# Patient Record
Sex: Female | Born: 1955 | Race: White | Hispanic: No | Smoking: Current some day smoker
Health system: Southern US, Community
[De-identification: ages and names within clinical notes are randomized; demographics above are authoritative.]

## PROBLEM LIST (undated history)

## (undated) DIAGNOSIS — I700 Atherosclerosis of aorta: Secondary | ICD-10-CM

## (undated) DIAGNOSIS — T4145XA Adverse effect of unspecified anesthetic, initial encounter: Secondary | ICD-10-CM

## (undated) DIAGNOSIS — N200 Calculus of kidney: Secondary | ICD-10-CM

## (undated) DIAGNOSIS — M47816 Spondylosis without myelopathy or radiculopathy, lumbar region: Secondary | ICD-10-CM

## (undated) DIAGNOSIS — C801 Malignant (primary) neoplasm, unspecified: Secondary | ICD-10-CM

## (undated) DIAGNOSIS — Z8709 Personal history of other diseases of the respiratory system: Secondary | ICD-10-CM

## (undated) DIAGNOSIS — F40240 Claustrophobia: Secondary | ICD-10-CM

## (undated) DIAGNOSIS — Z789 Other specified health status: Secondary | ICD-10-CM

## (undated) DIAGNOSIS — K219 Gastro-esophageal reflux disease without esophagitis: Secondary | ICD-10-CM

## (undated) DIAGNOSIS — N289 Disorder of kidney and ureter, unspecified: Secondary | ICD-10-CM

## (undated) DIAGNOSIS — M51369 Other intervertebral disc degeneration, lumbar region without mention of lumbar back pain or lower extremity pain: Secondary | ICD-10-CM

## (undated) DIAGNOSIS — Z9289 Personal history of other medical treatment: Secondary | ICD-10-CM

## (undated) DIAGNOSIS — Z973 Presence of spectacles and contact lenses: Secondary | ICD-10-CM

## (undated) DIAGNOSIS — F419 Anxiety disorder, unspecified: Secondary | ICD-10-CM

## (undated) DIAGNOSIS — R519 Headache, unspecified: Secondary | ICD-10-CM

## (undated) DIAGNOSIS — Z862 Personal history of diseases of the blood and blood-forming organs and certain disorders involving the immune mechanism: Secondary | ICD-10-CM

## (undated) DIAGNOSIS — Z87442 Personal history of urinary calculi: Secondary | ICD-10-CM

## (undated) DIAGNOSIS — M5136 Other intervertebral disc degeneration, lumbar region: Secondary | ICD-10-CM

## (undated) DIAGNOSIS — J449 Chronic obstructive pulmonary disease, unspecified: Secondary | ICD-10-CM

## (undated) DIAGNOSIS — Z972 Presence of dental prosthetic device (complete) (partial): Secondary | ICD-10-CM

## (undated) DIAGNOSIS — M1990 Unspecified osteoarthritis, unspecified site: Secondary | ICD-10-CM

## (undated) DIAGNOSIS — T8859XA Other complications of anesthesia, initial encounter: Secondary | ICD-10-CM

## (undated) DIAGNOSIS — R51 Headache: Secondary | ICD-10-CM

## (undated) HISTORY — DX: Anxiety disorder, unspecified: F41.9

## (undated) HISTORY — DX: Unspecified osteoarthritis, unspecified site: M19.90

## (undated) HISTORY — PX: ESOPHAGOGASTRODUODENOSCOPY: SHX1529

## (undated) HISTORY — PX: BACK SURGERY: SHX140

## (undated) HISTORY — PX: CERVICAL FUSION: SHX112

## (undated) HISTORY — PX: APPENDECTOMY: SHX54

## (undated) HISTORY — PX: COLONOSCOPY: SHX174

## (undated) HISTORY — PX: TOTAL HIP ARTHROPLASTY: SHX124

---

## 2017-01-30 ENCOUNTER — Emergency Department (HOSPITAL_COMMUNITY)
Admission: EM | Admit: 2017-01-30 | Discharge: 2017-01-30 | Disposition: A | Discharge status: Home / Self Care | Payer: Medicare Other | Attending: Emergency Medicine | Admitting: Emergency Medicine

## 2017-01-30 ENCOUNTER — Emergency Department (HOSPITAL_COMMUNITY): Payer: Medicare Other

## 2017-01-30 ENCOUNTER — Encounter (HOSPITAL_COMMUNITY): Payer: Self-pay | Admitting: Emergency Medicine

## 2017-01-30 DIAGNOSIS — W182XXA Fall in (into) shower or empty bathtub, initial encounter: Secondary | ICD-10-CM | POA: Diagnosis not present

## 2017-01-30 DIAGNOSIS — Z760 Encounter for issue of repeat prescription: Secondary | ICD-10-CM | POA: Insufficient documentation

## 2017-01-30 DIAGNOSIS — Y939 Activity, unspecified: Secondary | ICD-10-CM | POA: Insufficient documentation

## 2017-01-30 DIAGNOSIS — Y999 Unspecified external cause status: Secondary | ICD-10-CM | POA: Diagnosis not present

## 2017-01-30 DIAGNOSIS — W19XXXA Unspecified fall, initial encounter: Secondary | ICD-10-CM

## 2017-01-30 DIAGNOSIS — G8929 Other chronic pain: Secondary | ICD-10-CM | POA: Diagnosis not present

## 2017-01-30 DIAGNOSIS — S79912A Unspecified injury of left hip, initial encounter: Secondary | ICD-10-CM | POA: Diagnosis present

## 2017-01-30 DIAGNOSIS — Z96642 Presence of left artificial hip joint: Secondary | ICD-10-CM | POA: Insufficient documentation

## 2017-01-30 DIAGNOSIS — S7002XA Contusion of left hip, initial encounter: Secondary | ICD-10-CM | POA: Insufficient documentation

## 2017-01-30 DIAGNOSIS — F17200 Nicotine dependence, unspecified, uncomplicated: Secondary | ICD-10-CM | POA: Diagnosis not present

## 2017-01-30 DIAGNOSIS — Y929 Unspecified place or not applicable: Secondary | ICD-10-CM | POA: Insufficient documentation

## 2017-01-30 MED ORDER — DIAZEPAM 5 MG PO TABS: 5.0000 mg | ORAL_TABLET | Freq: Every day | ORAL | 0 refills | Status: DC

## 2017-01-30 MED ORDER — OXYCODONE-ACETAMINOPHEN 5-325 MG PO TABS
2.0000 | ORAL_TABLET | Freq: Once | ORAL | Status: AC
  Administered 2017-01-30: 2 via ORAL
  Filled 2017-01-30: qty 2

## 2017-01-30 MED ORDER — OXYCODONE-ACETAMINOPHEN 10-325 MG PO TABS: 1.0000 | ORAL_TABLET | Freq: Four times a day (QID) | ORAL | 0 refills | Status: DC | PRN

## 2017-01-30 NOTE — ED Notes (Signed)
Patient transported to X-ray 
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
## 2017-01-30 NOTE — Discharge Instructions (Signed)
Keep your scheduled appointment with Dr. Marlou Sa on 02/03/2017

## 2017-01-30 NOTE — ED Triage Notes (Signed)
Pt c/o pain in left side of body after having several falls.  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

## 2017-05-16 ENCOUNTER — Encounter (HOSPITAL_COMMUNITY): Payer: Self-pay | Admitting: Emergency Medicine

## 2017-05-16 ENCOUNTER — Emergency Department (HOSPITAL_COMMUNITY): Payer: Medicare Other

## 2017-05-16 ENCOUNTER — Emergency Department (HOSPITAL_COMMUNITY)
Admission: EM | Admit: 2017-05-16 | Discharge: 2017-05-17 | Disposition: A | Discharge status: Home / Self Care | Payer: Medicare Other | Attending: Emergency Medicine | Admitting: Emergency Medicine

## 2017-05-16 DIAGNOSIS — R1032 Left lower quadrant pain: Secondary | ICD-10-CM | POA: Diagnosis present

## 2017-05-16 DIAGNOSIS — N200 Calculus of kidney: Secondary | ICD-10-CM

## 2017-05-16 DIAGNOSIS — F17200 Nicotine dependence, unspecified, uncomplicated: Secondary | ICD-10-CM | POA: Diagnosis not present

## 2017-05-16 DIAGNOSIS — Z79899 Other long term (current) drug therapy: Secondary | ICD-10-CM | POA: Diagnosis not present

## 2017-05-16 DIAGNOSIS — Z96649 Presence of unspecified artificial hip joint: Secondary | ICD-10-CM | POA: Insufficient documentation

## 2017-05-16 HISTORY — DX: Disorder of kidney and ureter, unspecified: N28.9

## 2017-05-16 LAB — CBC WITH DIFFERENTIAL/PLATELET
BASOS ABS: 0 10*3/uL (ref 0.0–0.1)
BASOS PCT: 0 %
EOS PCT: 2 %
Eosinophils Absolute: 0.1 10*3/uL (ref 0.0–0.7)
HCT: 45.9 % (ref 36.0–46.0)
Hemoglobin: 15.3 g/dL — ABNORMAL HIGH (ref 12.0–15.0)
LYMPHS PCT: 33 %
Lymphs Abs: 2.7 10*3/uL (ref 0.7–4.0)
MCH: 30.7 pg (ref 26.0–34.0)
MCHC: 33.3 g/dL (ref 30.0–36.0)
MCV: 92 fL (ref 78.0–100.0)
MONO ABS: 0.5 10*3/uL (ref 0.1–1.0)
Monocytes Relative: 6 %
Neutro Abs: 4.9 10*3/uL (ref 1.7–7.7)
Neutrophils Relative %: 59 %
PLATELETS: 212 10*3/uL (ref 150–400)
RBC: 4.99 MIL/uL (ref 3.87–5.11)
RDW: 12.9 % (ref 11.5–15.5)
WBC: 8.2 10*3/uL (ref 4.0–10.5)

## 2017-05-16 LAB — COMPREHENSIVE METABOLIC PANEL
ALBUMIN: 4 g/dL (ref 3.5–5.0)
ALT: 14 U/L (ref 14–54)
AST: 18 U/L (ref 15–41)
Alkaline Phosphatase: 85 U/L (ref 38–126)
Anion gap: 7 (ref 5–15)
BUN: 7 mg/dL (ref 6–20)
CHLORIDE: 102 mmol/L (ref 101–111)
CO2: 28 mmol/L (ref 22–32)
CREATININE: 0.66 mg/dL (ref 0.44–1.00)
Calcium: 9.4 mg/dL (ref 8.9–10.3)
GFR calc Af Amer: >60 mL/min (ref >60)
GFR calc non Af Amer: >60 mL/min (ref >60)
GLUCOSE: 125 mg/dL — AB (ref 65–99)
POTASSIUM: 3.7 mmol/L (ref 3.5–5.1)
SODIUM: 137 mmol/L (ref 135–145)
Total Bilirubin: 0.8 mg/dL (ref 0.3–1.2)
Total Protein: 6.7 g/dL (ref 6.5–8.1)

## 2017-05-16 LAB — URINALYSIS, ROUTINE W REFLEX MICROSCOPIC
Bilirubin Urine: NEGATIVE
GLUCOSE, UA: NEGATIVE mg/dL
KETONES UR: NEGATIVE mg/dL
NITRITE: NEGATIVE
PROTEIN: NEGATIVE mg/dL
Specific Gravity, Urine: 1.013 (ref 1.005–1.030)
pH: 7 (ref 5.0–8.0)

## 2017-05-16 MED ORDER — FENTANYL CITRATE (PF) 100 MCG/2ML IJ SOLN
100.0000 ug | Freq: Once | INTRAMUSCULAR | Status: AC
Start: 2017-05-16 — End: 2017-05-16
  Administered 2017-05-16: 100 ug via INTRAVENOUS
  Filled 2017-05-16: qty 2

## 2017-05-16 MED ORDER — SODIUM CHLORIDE 0.9 % IV BOLUS (SEPSIS)
1000.0000 mL | Freq: Once | INTRAVENOUS | Status: AC
  Administered 2017-05-16: 1000 mL via INTRAVENOUS

## 2017-05-16 MED ORDER — OXYCODONE-ACETAMINOPHEN 5-325 MG PO TABS
ORAL_TABLET | ORAL | Status: AC
  Filled 2017-05-16: qty 1

## 2017-05-16 MED ORDER — MORPHINE SULFATE (PF) 4 MG/ML IV SOLN
4.0000 mg | Freq: Once | INTRAVENOUS | Status: DC
  Filled 2017-05-16: qty 1

## 2017-05-16 MED ORDER — ONDANSETRON 4 MG PO TBDP
8.0000 mg | ORAL_TABLET | Freq: Once | ORAL | Status: AC
  Administered 2017-05-16: 8 mg via ORAL

## 2017-05-16 MED ORDER — DEXTROSE 5 % IV SOLN
1.0000 g | Freq: Once | INTRAVENOUS | Status: DC
  Filled 2017-05-16: qty 10

## 2017-05-16 MED ORDER — OXYCODONE-ACETAMINOPHEN 5-325 MG PO TABS
1.0000 | ORAL_TABLET | Freq: Once | ORAL | Status: AC
  Administered 2017-05-16: 1 via ORAL

## 2017-05-16 MED ORDER — ONDANSETRON 4 MG PO TBDP
ORAL_TABLET | ORAL | Status: AC
  Filled 2017-05-16: qty 2

## 2017-05-16 MED ORDER — KETOROLAC TROMETHAMINE 30 MG/ML IJ SOLN
30.0000 mg | Freq: Once | INTRAMUSCULAR | Status: AC
  Administered 2017-05-16: 30 mg via INTRAVENOUS
  Filled 2017-05-16: qty 1

## 2017-05-16 NOTE — ED Provider Notes (Signed)
[facility] DEPT Provider Note   CSN: [id number] Arrival date & time: 05/16/17  1958     History   Chief Complaint Chief Complaint  Patient presents with  . Flank Pain  . Hematuria    HPI Iverna Hammac is a 61 y.o. female.  Hayle Parisi is a 61 y.o. Female who presents to the emergency department complaining of urinary symptoms and flank pain ongoing for 2-3 days. Patient reports her symptoms began with some urinary pressure, increased frequency and urgency to urinate about 2-3 days ago. She tells me she's also been having some left flank pain that can fluctuate in intensity without warning. She reports she has a history of kidney stones and reports this feels similar. She also believes she may be developing a urinary tract infection. She reports some nausea but no vomiting. She took a Percocet earlier today with little relief of her symptoms. She denies fevers, difficulty urinating, abdominal pain, vomiting, diarrhea, chest pain, shortness of breath or rashes.   The history is provided by the patient, medical records and a relative. No language interpreter was used.  Flank Pain  Pertinent negatives include no chest pain, no abdominal pain, no headaches and no shortness of breath.  Hematuria  Pertinent negatives include no chest pain, no abdominal pain, no headaches and no shortness of breath.    Past Medical History:  Diagnosis Date  . Renal disorder     There are no active problems to display for this patient.   Past Surgical History:  Procedure Laterality Date  . APPENDECTOMY    . BACK SURGERY    . CERVICAL FUSION    . TOTAL HIP ARTHROPLASTY      OB History    No data available       Home Medications    Prior to Admission medications   Medication Sig Start Date End Date Taking? Authorizing Provider  diazepam (VALIUM) 5 MG tablet Take 1 tablet (5 mg total) by mouth daily. 01/30/17   Orlie Dakin, MD  naproxen (NAPROSYN) 250 MG tablet Take 1 tablet  (250 mg total) by mouth 2 (two) times daily with a meal. 05/17/17   Waynetta Pean, PA-C  oxyCODONE-acetaminophen (PERCOCET) 10-325 MG tablet Take 1 tablet by mouth every 6 (six) hours as needed for pain. 01/30/17   Orlie Dakin, MD  tamsulosin (FLOMAX) 0.4 MG CAPS capsule Take 1 capsule (0.4 mg total) by mouth 2 (two) times daily. 05/17/17   Waynetta Pean, PA-C    Family History No family history on file.  Social History Social History  Substance Use Topics  . Smoking status: Current Every Day Smoker  . Smokeless tobacco: Never Used  . Alcohol use No     Allergies   Morphine and related; Aspirin; Benadryl [diphenhydramine]; Ciprocin-fluocin-procin [fluocinolone]; Penicillins; and Sulfa antibiotics   Review of Systems Review of Systems  Constitutional: Negative for chills and fever.  HENT: Negative for congestion and sore throat.   Eyes: Negative for visual disturbance.  Respiratory: Negative for cough, shortness of breath and wheezing.   Cardiovascular: Negative for chest pain.  Gastrointestinal: Positive for nausea. Negative for abdominal pain, diarrhea and vomiting.  Genitourinary: Positive for dysuria, flank pain, frequency, hematuria and urgency. Negative for decreased urine volume, difficulty urinating, pelvic pain and vaginal pain.  Musculoskeletal: Negative for back pain and neck pain.  Skin: Negative for rash.  Neurological: Negative for weakness, light-headedness and headaches.     Physical Exam Updated Vital Signs BP 121/66 (BP Location: Right Arm)  Pulse 70   Temp 98.3 F (36.8 C) (Oral)   Resp 18   SpO2 96%   Physical Exam  Constitutional: She appears well-developed and well-nourished. No distress.  Nontoxic appearing.  HENT:  Head: Normocephalic and atraumatic.  Mouth/Throat: Oropharynx is clear and moist.  Eyes: Pupils are equal, round, and reactive to light. Conjunctivae are normal. Right eye exhibits no discharge. Left eye exhibits no discharge.    Neck: Neck supple.  Cardiovascular: Normal rate, regular rhythm, normal heart sounds and intact distal pulses.  Exam reveals no gallop and no friction rub.   No murmur heard. Pulmonary/Chest: Effort normal and breath sounds normal. No respiratory distress. She has no wheezes. She has no rales.  Abdominal: Soft. Bowel sounds are normal. She exhibits no distension and no mass. There is tenderness. There is no rebound and no guarding.  Abdomen is soft and nontender to palpation. Bowel sounds are present. Patient has left flank tenderness to palpation. No peritoneal signs.  Musculoskeletal: She exhibits no edema.  Lymphadenopathy:    She has no cervical adenopathy.  Neurological: She is alert. Coordination normal.  Skin: Skin is warm and dry. No rash noted. She is not diaphoretic. No erythema. No pallor.  Psychiatric: She has a normal mood and affect. Her behavior is normal.  Nursing note and vitals reviewed.    ED Treatments / Results  Labs (all labs ordered are listed, but only abnormal results are displayed) Labs Reviewed  URINALYSIS, ROUTINE W REFLEX MICROSCOPIC - Abnormal; Notable for the following:       Result Value   APPearance HAZY (*)    Hgb urine dipstick SMALL (*)    Leukocytes, UA LARGE (*)    Bacteria, UA FEW (*)    Squamous Epithelial / LPF 6-30 (*)    All other components within normal limits  CBC WITH DIFFERENTIAL/PLATELET - Abnormal; Notable for the following:    Hemoglobin 15.3 (*)    All other components within normal limits  COMPREHENSIVE METABOLIC PANEL - Abnormal; Notable for the following:    Glucose, Bld 125 (*)    All other components within normal limits  URINE CULTURE    EKG  EKG Interpretation None       Radiology Ct Renal Stone Study  Result Date: 05/16/2017 CLINICAL DATA:  Initial evaluation for acute left flank pain. EXAM: CT ABDOMEN AND PELVIS WITHOUT CONTRAST TECHNIQUE: Multidetector CT imaging of the abdomen and pelvis was performed  following the standard protocol without IV contrast. COMPARISON:  None. FINDINGS: Lower chest: Visualized lung bases are clear. Hepatobiliary: Subcentimeter calcification noted within liver. Liver otherwise unremarkable. Hypointense focus within the gallbladder lumen likely related to stones. Gallbladder otherwise unremarkable without acute inflammatory changes. No biliary dilatation. Pancreas: Pancreas within normal limits. Spleen: Spleen within normal limits. Adrenals/Urinary Tract: Adrenal glands are normal. On the left, there are multiple obstructive stones stacked on top of each other within the proximal left ureter with secondary moderate left hydroureteronephrosis. There are 3 stones total all which measuring approximately 8-9 mm. Left ureter is decompressed distally with no other definite calculi identified, although the distal ureter somewhat limited by streak artifact from left hip arthroplasty. Multiple additional nonobstructive calculi present within left kidney, largest of which position within the lower pole and measures 12 mm. On the right, multiple nonobstructive calculi present, largest of which position within the upper pole and measures 7 mm. No other radiopaque calculi seen along the course of the right renal collecting system. No right-sided hydronephrosis or  hydroureter. Bladder partially distended without acute abnormality. No layering stones within the bladder lumen. Stomach/Bowel: Small hiatal hernia noted. Stomach otherwise unremarkable. No evidence for bowel obstruction. No acute inflammatory changes seen about the bowels. Vascular/Lymphatic: Mild to moderate aortic atherosclerosis. No aneurysm. No adenopathy. Reproductive: Uterus and ovaries within normal limits. Other: No free air or fluid. Musculoskeletal: Asymmetric atrophy of the left psoas muscle noted. Left hip arthroplasty. No acute osseous abnormality. No worrisome lytic or blastic osseous lesions. IMPRESSION: 1. Three total  obstructive stones measuring 8-9 mm stacked within the proximal left ureter with secondary moderate left hydroureteronephrosis. 2. Additional bilateral nonobstructive nephrolithiasis as above. 3. Cholelithiasis. 4. Aortic atherosclerosis. Electronically Signed   By: Jeannine Boga M.D.   On: 05/16/2017 22:37    Procedures Procedures (including critical care time)  Medications Ordered in ED Medications  ondansetron (ZOFRAN-ODT) 4 MG disintegrating tablet (not administered)  oxyCODONE-acetaminophen (PERCOCET/ROXICET) 5-325 MG per tablet (not administered)  oxyCODONE-acetaminophen (PERCOCET/ROXICET) 5-325 MG per tablet 1 tablet (1 tablet Oral Given 05/16/17 2013)  ondansetron (ZOFRAN-ODT) disintegrating tablet 8 mg (8 mg Oral Given 05/16/17 2013)  sodium chloride 0.9 % bolus 1,000 mL (1,000 mLs Intravenous New Bag/Given 05/16/17 2319)  ketorolac (TORADOL) 30 MG/ML injection 30 mg (30 mg Intravenous Given 05/16/17 2326)  fentaNYL (SUBLIMAZE) injection 100 mcg (100 mcg Intravenous Given 05/16/17 2336)     Initial Impression / Assessment and Plan / ED Course  I have reviewed the triage vital signs and the nursing notes.  Pertinent labs & imaging results that were available during my care of the patient were reviewed by me and considered in my medical decision making (see chart for details).    This is a 61 y.o. Female who presents to the emergency department complaining of urinary symptoms and flank pain ongoing for 2-3 days. Patient reports her symptoms began with some urinary pressure, increased frequency and urgency to urinate about 2-3 days ago. She tells me she's also been having some left flank pain that can fluctuate in intensity without warning. She reports she has a history of kidney stones and reports this feels similar. She also believes she may be developing a urinary tract infection. She reports some nausea but no vomiting. On exam the patient is afebrile and nontoxic appearing. She  has a left flank tenderness to palpation. Abdomen is soft and nontender to palpation. CMP shows preserved kidney function. Urinalysis is nitrite negative with large leukocytes, 6-30 white blood cells, few bacteria and 6-30 squamous epithelial cells. Suspect this is contaminated. Urine sent for culture. Small hemoglobin also noted. CT renal stone study shows 3 total obstructive stones measuring 8-9 mm stacked within the proximal left ureter with secondary moderate left hydroureteronephrosis. I consulted with urologist Dr. Shanon Brow who reports she is not concerned about the urine and doubts that it is infected. She reports if patient's pain is controlled she can be discharged and follow up as an outpatient. Patient agrees with plan. Ad reevaluation patient reports she is ready to be discharged and her pain is controlled. She is tolerating by mouth. Will discharge at this time. She has Percocet 10 mg most recently filled on 04/23/2017. She can take these at home as needed for pain. We'll discharge with Flomax and naproxen as well. Patient discharged with strainer. We'll have her follow-up as an outpatient the last serology. Patient agrees with plan. I discussed strict and specific return precautions with the patient.  I advised the patient to follow-up with their primary care provider this week.  I advised the patient to return to the emergency department with new or worsening symptoms or new concerns. The patient verbalized understanding and agreement with plan.    This patient was discussed with Dr. Lacinda Axon who agrees with assessment and plan.    Final Clinical Impressions(s) / ED Diagnoses   Final diagnoses:  Kidney stones    New Prescriptions New Prescriptions   NAPROXEN (NAPROSYN) 250 MG TABLET    Take 1 tablet (250 mg total) by mouth 2 (two) times daily with a meal.   TAMSULOSIN (FLOMAX) 0.4 MG CAPS CAPSULE    Take 1 capsule (0.4 mg total) by mouth 2 (two) times daily.     Waynetta Pean,  PA-C 05/17/17 Leandrew Koyanagi    Nat Christen, MD 05/17/17 [phone]

## 2017-05-16 NOTE — ED Notes (Signed)
Patient returned from CT

## 2017-05-16 NOTE — ED Notes (Signed)
PT is a difficult stick and IV team has been ordered

## 2017-05-16 NOTE — ED Notes (Addendum)
Pt sent iconnect message stating that she was really hurting. Spoke with pt regarding taking another pain pill or that I can get her in the next room to see the doctor and hopefully get something a little stronger.  Pt to be moved to next room.

## 2017-05-16 NOTE — ED Notes (Signed)
Patient transported to CT 

## 2017-05-16 NOTE — ED Triage Notes (Signed)
Patient reports left flank/left lower back pain with hematuria onset today with mild nausea , denies fever or chills .

## 2017-05-17 MED ORDER — NAPROXEN 250 MG PO TABS: 250.0000 mg | ORAL_TABLET | Freq: Two times a day (BID) | ORAL | 0 refills | Status: DC

## 2017-05-17 MED ORDER — TAMSULOSIN HCL 0.4 MG PO CAPS: 0.4000 mg | ORAL_CAPSULE | Freq: Two times a day (BID) | ORAL | 0 refills | Status: DC

## 2017-05-18 LAB — URINE CULTURE: Culture: >=100000 — AB

## 2017-05-19 ENCOUNTER — Telehealth: Payer: Self-pay | Admitting: Emergency Medicine

## 2017-05-19 NOTE — Telephone Encounter (Signed)
Post ED Visit - Positive Culture Follow-up: Successful Patient Follow-Up  Culture assessed and recommendations reviewed by: []  Elenor Quinones, Pharm.D. []  Heide Guile, Pharm.D., BCPS AQ-ID []  Parks Neptune, Pharm.D., BCPS []  Alycia Rossetti, Pharm.D., BCPS []  [place], Pharm.D., BCPS, AAHIVP []  Legrand Como, Pharm.D., BCPS, AAHIVP []  Salome Arnt, PharmD, BCPS []  Dimitri Ped, PharmD, BCPS []  Vincenza Hews, PharmD, BCPS Jimmy Footman PharmD  Positive urine culture  []  Patient discharged without antimicrobial prescription and treatment is now indicated []  Organism is resistant to prescribed ED discharge antimicrobial []  Patient with positive blood cultures  Changes discussed with ED provider: Carmon Sails PA New antibiotic prescription  Symptom check, symptoms are improving, has appt with PCP on Tuesday 05/20/17 for followup    Hazle Nordmann 05/19/2017, 11:21 AM

## 2017-06-02 ENCOUNTER — Ambulatory Visit
Admission: RE | Admit: 2017-06-02 | Discharge: 2017-06-02 | Disposition: A | Discharge status: Home / Self Care | Payer: Medicare Other | Source: Ambulatory Visit | Attending: Physician Assistant | Admitting: Physician Assistant

## 2017-06-02 ENCOUNTER — Other Ambulatory Visit: Payer: Self-pay | Admitting: Physician Assistant

## 2017-06-02 DIAGNOSIS — M542 Cervicalgia: Secondary | ICD-10-CM

## 2017-06-02 DIAGNOSIS — M5489 Other dorsalgia: Secondary | ICD-10-CM

## 2017-07-07 ENCOUNTER — Encounter (HOSPITAL_COMMUNITY): Payer: Self-pay | Admitting: Emergency Medicine

## 2017-07-07 ENCOUNTER — Emergency Department (HOSPITAL_COMMUNITY)
Admission: EM | Admit: 2017-07-07 | Discharge: 2017-07-07 | Disposition: A | Discharge status: Home / Self Care | Payer: Medicare Other | Attending: Emergency Medicine | Admitting: Emergency Medicine

## 2017-07-07 ENCOUNTER — Emergency Department (HOSPITAL_COMMUNITY): Payer: Medicare Other

## 2017-07-07 DIAGNOSIS — R079 Chest pain, unspecified: Secondary | ICD-10-CM | POA: Insufficient documentation

## 2017-07-07 DIAGNOSIS — Z5321 Procedure and treatment not carried out due to patient leaving prior to being seen by health care provider: Secondary | ICD-10-CM | POA: Insufficient documentation

## 2017-07-07 LAB — I-STAT TROPONIN, ED: Troponin i, poc: 0 ng/mL (ref 0.00–0.08)

## 2017-07-07 LAB — BASIC METABOLIC PANEL
ANION GAP: 6 (ref 5–15)
BUN: 9 mg/dL (ref 6–20)
CHLORIDE: 103 mmol/L (ref 101–111)
CO2: 27 mmol/L (ref 22–32)
Calcium: 9.4 mg/dL (ref 8.9–10.3)
Creatinine, Ser: 0.89 mg/dL (ref 0.44–1.00)
GFR calc Af Amer: >60 mL/min (ref >60)
GFR calc non Af Amer: >60 mL/min (ref >60)
GLUCOSE: 110 mg/dL — AB (ref 65–99)
POTASSIUM: 4.1 mmol/L (ref 3.5–5.1)
Sodium: 136 mmol/L (ref 135–145)

## 2017-07-07 LAB — CBC
HEMATOCRIT: 43.3 % (ref 36.0–46.0)
HEMOGLOBIN: 14.4 g/dL (ref 12.0–15.0)
MCH: 30.2 pg (ref 26.0–34.0)
MCHC: 33.3 g/dL (ref 30.0–36.0)
MCV: 90.8 fL (ref 78.0–100.0)
Platelets: 213 10*3/uL (ref 150–400)
RBC: 4.77 MIL/uL (ref 3.87–5.11)
RDW: 12.2 % (ref 11.5–15.5)
WBC: 8.3 10*3/uL (ref 4.0–10.5)

## 2017-07-07 NOTE — ED Notes (Signed)
Pt states that she is leaving and would not to wait any longer. Risk of leaving prior to seeing provider reviewed with pt and verbalized understanding.

## 2017-07-07 NOTE — ED Triage Notes (Signed)
Pt sts left sided CP worse with inspiration with some body aches

## 2017-07-20 ENCOUNTER — Emergency Department (HOSPITAL_COMMUNITY): Payer: Medicare Other

## 2017-07-20 ENCOUNTER — Observation Stay (HOSPITAL_COMMUNITY)
Admission: EM | Admit: 2017-07-20 | Discharge: 2017-07-21 | Disposition: A | Discharge status: Home / Self Care | Payer: Medicare Other | Attending: Internal Medicine | Admitting: Internal Medicine

## 2017-07-20 ENCOUNTER — Encounter (HOSPITAL_COMMUNITY): Payer: Self-pay | Admitting: Emergency Medicine

## 2017-07-20 DIAGNOSIS — R0789 Other chest pain: Secondary | ICD-10-CM | POA: Diagnosis not present

## 2017-07-20 DIAGNOSIS — F419 Anxiety disorder, unspecified: Secondary | ICD-10-CM | POA: Diagnosis not present

## 2017-07-20 DIAGNOSIS — Z8249 Family history of ischemic heart disease and other diseases of the circulatory system: Secondary | ICD-10-CM | POA: Diagnosis not present

## 2017-07-20 DIAGNOSIS — Z886 Allergy status to analgesic agent status: Secondary | ICD-10-CM | POA: Diagnosis not present

## 2017-07-20 DIAGNOSIS — R079 Chest pain, unspecified: Secondary | ICD-10-CM

## 2017-07-20 DIAGNOSIS — F17200 Nicotine dependence, unspecified, uncomplicated: Secondary | ICD-10-CM | POA: Diagnosis not present

## 2017-07-20 DIAGNOSIS — Z79899 Other long term (current) drug therapy: Secondary | ICD-10-CM | POA: Diagnosis not present

## 2017-07-20 DIAGNOSIS — Z880 Allergy status to penicillin: Secondary | ICD-10-CM | POA: Diagnosis not present

## 2017-07-20 DIAGNOSIS — R0602 Shortness of breath: Secondary | ICD-10-CM | POA: Diagnosis not present

## 2017-07-20 HISTORY — DX: Other specified health status: Z78.9

## 2017-07-20 LAB — LIPID PANEL
CHOLESTEROL: 166 mg/dL (ref 0–200)
HDL: 49 mg/dL (ref >40)
LDL CALC: 101 mg/dL — AB (ref 0–99)
Total CHOL/HDL Ratio: 3.4 RATIO
Triglycerides: 82 mg/dL (ref <150)
VLDL: 16 mg/dL (ref 0–40)

## 2017-07-20 LAB — BASIC METABOLIC PANEL
ANION GAP: 8 (ref 5–15)
BUN: <5 mg/dL — ABNORMAL LOW (ref 6–20)
CALCIUM: 9.2 mg/dL (ref 8.9–10.3)
CHLORIDE: 101 mmol/L (ref 101–111)
CO2: 29 mmol/L (ref 22–32)
CREATININE: 0.71 mg/dL (ref 0.44–1.00)
GFR calc non Af Amer: >60 mL/min (ref >60)
GLUCOSE: 102 mg/dL — AB (ref 65–99)
Potassium: 3.5 mmol/L (ref 3.5–5.1)
Sodium: 138 mmol/L (ref 135–145)

## 2017-07-20 LAB — I-STAT TROPONIN, ED: Troponin i, poc: 0 ng/mL (ref 0.00–0.08)

## 2017-07-20 LAB — HEMOGLOBIN A1C
Hgb A1c MFr Bld: 5.2 % (ref 4.8–5.6)
MEAN PLASMA GLUCOSE: 102.54 mg/dL

## 2017-07-20 LAB — CBC
HCT: 42.3 % (ref 36.0–46.0)
HEMOGLOBIN: 14.2 g/dL (ref 12.0–15.0)
MCH: 30.5 pg (ref 26.0–34.0)
MCHC: 33.6 g/dL (ref 30.0–36.0)
MCV: 91 fL (ref 78.0–100.0)
Platelets: 199 10*3/uL (ref 150–400)
RBC: 4.65 MIL/uL (ref 3.87–5.11)
RDW: 12.7 % (ref 11.5–15.5)
WBC: 7.3 10*3/uL (ref 4.0–10.5)

## 2017-07-20 LAB — TROPONIN I: Troponin I: <0.03 ng/mL (ref <0.03)

## 2017-07-20 MED ORDER — ONDANSETRON HCL 4 MG/2ML IJ SOLN: 4.0000 mg | Freq: Four times a day (QID) | INTRAMUSCULAR | Status: DC | PRN

## 2017-07-20 MED ORDER — PANTOPRAZOLE SODIUM 40 MG PO TBEC
40.0000 mg | DELAYED_RELEASE_TABLET | Freq: Every day | ORAL | Status: DC
  Administered 2017-07-21: 40 mg via ORAL
  Filled 2017-07-20: qty 1

## 2017-07-20 MED ORDER — NITROGLYCERIN 0.3 MG SL SUBL
0.3000 mg | SUBLINGUAL_TABLET | SUBLINGUAL | Status: DC | PRN
  Filled 2017-07-20: qty 100

## 2017-07-20 MED ORDER — ACETAMINOPHEN 325 MG PO TABS: 650.0000 mg | ORAL_TABLET | ORAL | Status: DC | PRN

## 2017-07-20 MED ORDER — GI COCKTAIL ~~LOC~~
30.0000 mL | Freq: Once | ORAL | Status: AC
  Administered 2017-07-20: 30 mL via ORAL
  Filled 2017-07-20: qty 30

## 2017-07-20 MED ORDER — ALPRAZOLAM 0.25 MG PO TABS
0.2500 mg | ORAL_TABLET | Freq: Two times a day (BID) | ORAL | Status: DC | PRN
  Administered 2017-07-20 – 2017-07-21 (×2): 0.25 mg via ORAL
  Filled 2017-07-20 (×2): qty 1

## 2017-07-20 MED ORDER — HYDROMORPHONE HCL 1 MG/ML IJ SOLN
0.5000 mg | Freq: Once | INTRAMUSCULAR | Status: AC
  Administered 2017-07-20: 0.5 mg via INTRAVENOUS
  Filled 2017-07-20: qty 1

## 2017-07-20 MED ORDER — OXYCODONE HCL 5 MG PO TABS
5.0000 mg | ORAL_TABLET | Freq: Four times a day (QID) | ORAL | Status: DC | PRN
  Administered 2017-07-20 – 2017-07-21 (×3): 5 mg via ORAL
  Filled 2017-07-20 (×3): qty 1

## 2017-07-20 MED ORDER — OXYCODONE-ACETAMINOPHEN 5-325 MG PO TABS
1.0000 | ORAL_TABLET | Freq: Four times a day (QID) | ORAL | Status: DC | PRN
  Administered 2017-07-20 – 2017-07-21 (×3): 1 via ORAL
  Filled 2017-07-20 (×3): qty 1

## 2017-07-20 MED ORDER — HYDROMORPHONE HCL 1 MG/ML IJ SOLN
0.5000 mg | INTRAMUSCULAR | Status: DC | PRN
  Administered 2017-07-21: 0.5 mg via INTRAVENOUS
  Filled 2017-07-20: qty 0.5

## 2017-07-20 MED ORDER — OXYCODONE-ACETAMINOPHEN 10-325 MG PO TABS: 1.0000 | ORAL_TABLET | Freq: Four times a day (QID) | ORAL | Status: DC | PRN

## 2017-07-20 MED ORDER — NICOTINE POLACRILEX 2 MG MT GUM
2.0000 mg | CHEWING_GUM | OROMUCOSAL | Status: DC | PRN
  Filled 2017-07-20: qty 1

## 2017-07-20 MED ORDER — ONDANSETRON HCL 4 MG/2ML IJ SOLN
4.0000 mg | Freq: Once | INTRAMUSCULAR | Status: DC
  Filled 2017-07-20: qty 2

## 2017-07-20 MED ORDER — ENOXAPARIN SODIUM 40 MG/0.4ML ~~LOC~~ SOLN
40.0000 mg | SUBCUTANEOUS | Status: DC
  Administered 2017-07-20 – 2017-07-21 (×2): 40 mg via SUBCUTANEOUS
  Filled 2017-07-20 (×2): qty 0.4

## 2017-07-20 MED ORDER — BACLOFEN 5 MG HALF TABLET
5.0000 mg | ORAL_TABLET | Freq: Three times a day (TID) | ORAL | Status: DC | PRN
  Administered 2017-07-20: 5 mg via ORAL
  Filled 2017-07-20 (×2): qty 1

## 2017-07-20 NOTE — H&P (Signed)
History and Physical    Stacey Herman HBZ:[id number] DOB: August 20, 1956 DOA: 07/20/2017   PCP: Rogers Blocker, MD   Patient coming from:  Home    Chief Complaint: Chest Pain  HPI: Stacey Herman is a 61 y.o. female previously healthy, presenting to the emergency department via EMS, with complaints of acute, substernal chest pain since 4 in the morning, waking her up from her sleep, accompanied by diaphoresis, and radiation to the jaw and left arm. She describes her pain and 10 out of 10. She is short reported intermittent shortness of breath and nausea with the symptoms. Because she is allergic to aspirin and morphine, she was given 1 nitroglycerin, without significant response. Another nitroglycerin was not given, since her blood pressure dropped from 1:30 to 80. On arrival, the patient describes her pain around 4 out of 10. Denies any dizziness or falls. No syncope or presyncope. Denies any cough. Denies any fever or chills. Denies any  vomiting or abdominal pain. Appetite is normal and eats salt rich foods. Denies any leg swelling or calf pain. Denies any headaches or vision changes. Denies any seizures No confusion reported. Never seen by a cardiologist No recent long distance trips. She lives with her daughter and 5 grandchildren, home she is frequently picking them up, she states that that provokes significant stress in her life. No new meds. Not on hormonal therapy.  No new herbal supplements. Continues to smoke up to one pack a day. No ETOH or recreational drugs.The patient reports significant family history of cardiac disease.  ED Course:  BP 109/71   Pulse 64   Temp 98.2 F (36.8 C) (Oral)   Resp 12   SpO2 93%    CMET and CBC normal TN 0 CXR NAD  She is receiving GI cocktail at the time of evaluation.  Review of Systems:  As per HPI otherwise all other systems reviewed and are negative  Past Medical History:  Diagnosis Date  . Medical history non-contributory   . Renal  disorder     Past Surgical History:  Procedure Laterality Date  . APPENDECTOMY    . BACK SURGERY    . CERVICAL FUSION    . TOTAL HIP ARTHROPLASTY      Social History Social History   Social History  . Marital status: Divorced    Spouse name: N/A  . Number of children: N/A  . Years of education: N/A   Occupational History  . Not on file.   Social History Main Topics  . Smoking status: Current Every Day Smoker  . Smokeless tobacco: Never Used  . Alcohol use No  . Drug use: No  . Sexual activity: Not on file   Other Topics Concern  . Not on file   Social History Narrative   Lives w daughter and 5 grandchildren      Allergies  Allergen Reactions  . Morphine And Related Other (See Comments)    Unknown  . Aspirin Other (See Comments)    Unknown  . Benadryl [Diphenhydramine] Other (See Comments)    Unknown  . Ciprocin-Fluocin-Procin [Fluocinolone] Other (See Comments)    Unknown  . Penicillins Other (See Comments)    Unknown  . Sulfa Antibiotics Other (See Comments)    Unknown    Family History  Problem Relation Age of Onset  . Cancer Mother   . Heart attack Father   . Heart attack Paternal Grandmother       Prior to Admission medications   Medication  Sig Start Date End Date Taking? Authorizing Provider  esomeprazole (NEXIUM) 20 MG capsule Take 20 mg by mouth daily.    Yes [provider]  omeprazole (PRILOSEC OTC) 20 MG tablet Take 20 mg by mouth at bedtime.   Yes [provider]  oxyCODONE-acetaminophen (PERCOCET) 10-325 MG tablet Take 1 tablet by mouth every 6 (six) hours as needed for pain. 01/30/17  Yes Orlie Dakin, MD  diazepam (VALIUM) 5 MG tablet Take 1 tablet (5 mg total) by mouth daily. Patient not taking: Reported on 07/20/2017 01/30/17   Orlie Dakin, MD  naproxen (NAPROSYN) 250 MG tablet Take 1 tablet (250 mg total) by mouth 2 (two) times daily with a meal. Patient not taking: Reported on 07/20/2017 05/17/17   Waynetta Pean, PA-C  tamsulosin (FLOMAX) 0.4 MG CAPS capsule Take 1 capsule (0.4 mg total) by mouth 2 (two) times daily. Patient not taking: Reported on 07/20/2017 05/17/17   Waynetta Pean, PA-C    Physical Exam:  Vitals:   07/20/17 1130 07/20/17 1145 07/20/17 1200 07/20/17 1230  BP: 109/63 111/69 114/67 109/71  Pulse: 78 78 92 64  Resp: (!) 9 13 (!) 21 12  Temp:      TempSrc:      SpO2: 96% 94% 98% 93%   Constitutional: NAD,Very anxious appearing.  Eyes: PERRL, lids and conjunctivae normal ENMT: Mucous membranes are moist, without exudate or lesions  Neck: normal, supple, no masses, no thyromegaly Respiratory: clear to auscultation bilaterally, no wheezing, no crackles. Normal respiratory effort  Cardiovascular: Regular rate and rhythm,  murmur, rubs or gallops. No extremity edema. 2+ pedal pulses. No carotid bruits.  Abdomen: Soft, non tender, No hepatosplenomegaly. Bowel sounds positive.  Musculoskeletal: no clubbing / cyanosis. Moves all extremities. Substernal tenderness is reproducible on light palpation. Palpation of her spinal region also reproduces pain. Skin: no jaundice, No lesions.  Neurologic: Sensation intact  Strength equal in all extremities Psychiatric:   Alert and oriented x 3. Anxious  appearing     Labs on Admission: I have personally reviewed following labs and imaging studies  CBC:  Recent Labs Lab 07/20/17 0957  WBC 7.3  HGB 14.2  HCT 42.3  MCV 91.0  PLT 093    Basic Metabolic Panel:  Recent Labs Lab 07/20/17 0957  NA 138  K 3.5  CL 101  CO2 29  GLUCOSE 102*  BUN <5*  CREATININE 0.71  CALCIUM 9.2    GFR: CrCl cannot be calculated (Unknown ideal weight.).  Liver Function Tests: No results for input(s): AST, ALT, ALKPHOS, BILITOT, PROT, ALBUMIN in the last 168 hours. No results for input(s): LIPASE, AMYLASE in the last 168 hours. No results for input(s): AMMONIA in the last 168 hours.  Coagulation Profile: No results for input(s):  INR, PROTIME in the last 168 hours.  Cardiac Enzymes: No results for input(s): CKTOTAL, CKMB, CKMBINDEX, TROPONINI in the last 168 hours.  BNP (last 3 results) No results for input(s): PROBNP in the last 8760 hours.  HbA1C: No results for input(s): HGBA1C in the last 72 hours.  CBG: No results for input(s): GLUCAP in the last 168 hours.  Lipid Profile: No results for input(s): CHOL, HDL, LDLCALC, TRIG, CHOLHDL, LDLDIRECT in the last 72 hours.  Thyroid Function Tests: No results for input(s): TSH, T4TOTAL, FREET4, T3FREE, THYROIDAB in the last 72 hours.  Anemia Panel: No results for input(s): VITAMINB12, FOLATE, FERRITIN, TIBC, IRON, RETICCTPCT in the last 72 hours.  Urine analysis:    Component Value Date/Time  COLORURINE YELLOW 05/16/2017 2008   APPEARANCEUR HAZY (A) 05/16/2017 2008   LABSPEC 1.013 05/16/2017 2008   PHURINE 7.0 05/16/2017 2008   GLUCOSEU NEGATIVE 05/16/2017 2008   HGBUR SMALL (A) 05/16/2017 2008   BILIRUBINUR NEGATIVE 05/16/2017 2008   [place] NEGATIVE 05/16/2017 2008   PROTEINUR NEGATIVE 05/16/2017 2008   NITRITE NEGATIVE 05/16/2017 2008   LEUKOCYTESUR LARGE (A) 05/16/2017 2008    Sepsis Labs: @LABRCNTIP (procalcitonin:4,lacticidven:4) )No results found for this or any previous visit (from the past 240 hour(s)).   Radiological Exams on Admission: Dg Chest 2 View  Result Date: 07/20/2017 CLINICAL DATA:  Mid chest pain. EXAM: CHEST  2 VIEW COMPARISON:  July 07, 2017 FINDINGS: The heart, hila, mediastinum, lungs, and pleura are unchanged. No focal infiltrate, nodule, mass, or overt edema. IMPRESSION: No active cardiopulmonary disease. Electronically Signed   By: Dorise Bullion III M.D   On: 07/20/2017 10:29    EKG: Independently reviewed.  Assessment/Plan Active Problems:   Chest pain  Chest pain syndrome, cardiac versus musculoskeletal vs anxiety HEART score 3-4  . Troponin 0  , EKG without evidence of ACS. C unrelieved by nitroglycerin.  REceived Dilaudid with some response.CXR unrevealing.   Risk factors include age, obesity, strong family history of heart disease.   Admit to Telemetry/ Observation Chest pain order set Cycle troponins EKG in am continue  O2 and NTG as needed GI cocktail Check Lipid panel and Hb A1C Initiate Baclofen 5 mg prn for musculoskeletal symptoms  Xanax for anxiety   Tobacco abuse   Provide Nicotine gum prn  Counseled cessation    DVT prophylaxis:  Lovenox Code Status:    Full  Family Communication:  Discussed with patient Disposition Plan: Expect patient to be discharged to home after condition improves Consults called:    None  Admission status: Tele Obs    Maxamillion Banas E, PA-C [facility]   07/20/2017, 12:47 PM

## 2017-07-20 NOTE — ED Triage Notes (Signed)
Patient presents to ED with complaits of Chest Pressure since 0400. Patient reports she has intermitted SOB and Nausea. Patient reports pain radiates to left arm and reports pain 4/10. EMS reports patient was give 1 nitro and BP went from 130 to 80. Patient reports family hx of heart attacks but denies any cardiac hx.

## 2017-07-20 NOTE — Progress Notes (Signed)
Patient admitted to [facility] from Emergency Room, VSS.  Patient smoker, lungs diminished, oxygen saturation in the 90's on room air.  Only complaint that patient has at this time is her back pain, takes Percocet 10/325 4 times a day at home, MD notified of this.  Denies active chest pain at this time.

## 2017-07-20 NOTE — ED Provider Notes (Signed)
[facility] Provider Note   CSN: [id number] Arrival date & time: 07/20/17  7628     History   Chief Complaint Chief Complaint  Patient presents with  . Chest Pain   HPI   Stacey Herman is a 61 y.o. female c/o pressure like chest pain 6/10 with associated shortness of breath woke her out of sleep at 4 AM. Pain is left sided, radiates to the left arm and left jaw described as tingling in the arm and jaw. Associated with diaphoresis, nausea with no emesis. She was given nitroglycerin by EMS and that alleviated the pain to a 5 out of 10. She states she has an anaphylactic reaction to aspirin. Denies exacerbation of pain with exertion but states she would bemore short of breath with exertion. She denies any history of COPD or wheezing. She denies diabetes, hypertension, hyperlipidemia but she is a daily smoker and has an extensive family history of ACS. He had a negative stress test in February 2016. Denies  cough, fever, back pain, syncope, prior episodes, recent cocaine/methamphetimine use. Denies h/o DVT, PE,  recent travel, leg swelling, hemoptysis. Denies orthopnea, PND, history of CHF. On review of systems she notes a midline thoracic back pain.  RF: Smoker, age, family history Last Stress test: February 2016 Cardiologist: ? BTD:VVOH Marlou Sa   Past Medical History:  Diagnosis Date  . Medical history non-contributory   . Renal disorder     Patient Active Problem List   Diagnosis Date Noted  . Chest pain 07/20/2017    Past Surgical History:  Procedure Laterality Date  . APPENDECTOMY    . BACK SURGERY    . CERVICAL FUSION    . TOTAL HIP ARTHROPLASTY      OB History    No data available       Home Medications    Prior to Admission medications   Medication Sig Start Date End Date Taking? Authorizing Provider  esomeprazole (NEXIUM) 20 MG capsule Take 20 mg by mouth daily.    Yes [provider]  omeprazole (PRILOSEC OTC) 20 MG tablet Take 20 mg  by mouth at bedtime.   Yes [provider]  oxyCODONE-acetaminophen (PERCOCET) 10-325 MG tablet Take 1 tablet by mouth every 6 (six) hours as needed for pain. 01/30/17  Yes Orlie Dakin, MD  traZODone (DESYREL) 50 MG tablet Take 1 tablet (50 mg total) by mouth at bedtime as needed for sleep. 07/21/17   Geradine Girt, DO    Family History Family History  Problem Relation Age of Onset  . Cancer Mother   . Heart attack Father   . Heart attack Paternal Grandmother     Social History Social History  Substance Use Topics  . Smoking status: Current Every Day Smoker  . Smokeless tobacco: Never Used  . Alcohol use No     Allergies   Aspirin; Morphine and related; Benadryl [diphenhydramine]; Ciprocin-fluocin-procin [fluocinolone]; Penicillins; and Sulfa antibiotics   Review of Systems Review of Systems  A complete review of systems was obtained and all systems are negative except as noted in the HPI and PMH.   Physical Exam Updated Vital Signs BP 119/68 (BP Location: Right Arm)   Pulse 72   Temp 97.8 F (36.6 C) (Oral)   Resp 18   Ht 5\' 10"  (1.778 m)   Wt 85.1 kg (187 lb 9.6 oz) Comment: scale b  SpO2 96%   BMI 26.92 kg/m   Physical Exam  Constitutional: She is oriented to person,  place, and time. She appears well-developed and well-nourished. No distress.  Appears older than stated age  HENT:  Head: Normocephalic.  Mouth/Throat: Oropharynx is clear and moist.  Eyes: Conjunctivae are normal.  Neck: Normal range of motion. No JVD present. No tracheal deviation present.  Cardiovascular: Normal rate, regular rhythm and intact distal pulses.   Radial pulse equal bilaterally  Pulmonary/Chest: Effort normal. No stridor. No respiratory distress. She has no wheezes. She has no rales. She exhibits no tenderness.  Poor air movement in all fields, no wheezes  Abdominal: Soft. She exhibits no distension and no mass. There is no tenderness. There is no rebound and no  guarding.  Musculoskeletal: Normal range of motion. She exhibits no edema or tenderness.  No calf asymmetry, superficial collaterals, palpable cords, edema, Homans sign negative bilaterally.    Neurological: She is alert and oriented to person, place, and time.  Skin: Skin is warm. She is not diaphoretic.  Psychiatric: She has a normal mood and affect.  Nursing note and vitals reviewed.    ED Treatments / Results  Labs (all labs ordered are listed, but only abnormal results are displayed) Labs Reviewed  BASIC METABOLIC PANEL - Abnormal; Notable for the following:       Result Value   Glucose, Bld 102 (*)    BUN <5 (*)    All other components within normal limits  LIPID PANEL - Abnormal; Notable for the following:    LDL Cholesterol 101 (*)    All other components within normal limits  CBC  TROPONIN I  TROPONIN I  TROPONIN I  HEMOGLOBIN A1C  I-STAT TROPONIN, ED    EKG  EKG Interpretation  Date/Time:  Sunday July 20 2017 09:40:36 EDT Ventricular Rate:  74 PR Interval:    QRS Duration: 100 QT Interval:  388 QTC Calculation: 431 R Axis:   50 Text Interpretation:  Sinus rhythm Confirmed by Lita Mains  MD, DAVID ([id number]) on 07/20/2017 11:08:55 AM       Radiology No results found.  Procedures Procedures (including critical care time)  Medications Ordered in ED Medications  HYDROmorphone (DILAUDID) injection 0.5 mg (0.5 mg Intravenous Given 07/20/17 1055)  gi cocktail (Maalox,Lidocaine,Donnatal) (30 mLs Oral Given 07/20/17 1157)     Initial Impression / Assessment and Plan / ED Course  I have reviewed the triage vital signs and the nursing notes.  Pertinent labs & imaging results that were available during my care of the patient were reviewed by me and considered in my medical decision making (see chart for details).     Vitals:   07/20/17 1507 07/20/17 1927 07/21/17 0534 07/21/17 1133  BP: 105/67 106/72 (!) 99/52 119/68  Pulse: 78 91 66 72  Resp: 20 18  18    Temp: 98.8 F (37.1 C) 98.4 F (36.9 C) 97.8 F (36.6 C)   TempSrc: Oral Oral Oral   SpO2: 94% 94% 98% 96%  Weight: 84.6 kg (186 lb 7 oz)  85.1 kg (187 lb 9.6 oz)   Height: 5\' 10"  (1.778 m)       Medications  HYDROmorphone (DILAUDID) injection 0.5 mg (0.5 mg Intravenous Given 07/20/17 1055)  gi cocktail (Maalox,Lidocaine,Donnatal) (30 mLs Oral Given 07/20/17 1157)    Stacey Herman is 61 y.o. female presenting with Chest pain, shortness of breath, diaphoresis, nausea onset at 4 AM this morning. She also has some associated thoracic back pain, pulses equal, appears relatively comfortable. I doubt this is a dissection. EKG with no acute findings. She states  she has an anaphylactic reaction to aspirin also morphine. Patient will be given Dilaudid.  Troponin, blood work, chest x-ray negative. Still has pain despite Dilaudid. Will need chest pain rule out.  Discussed with [facility] hospitalist PA [facility] who will admit and recommends trial of GI cocktail.   Final Clinical Impressions(s) / ED Diagnoses   Final diagnoses:  Chest pain, unspecified type    New Prescriptions Discharge Medication List as of 07/21/2017  6:07 PM    START taking these medications   Details  traZODone (DESYREL) 50 MG tablet Take 1 tablet (50 mg total) by mouth at bedtime as needed for sleep., Starting Mon 07/21/2017, Normal         Lisa Blakeman, Charna Elizabeth 07/23/17 1022    Julianne Rice, MD 07/28/17 0020

## 2017-07-20 NOTE — ED Notes (Signed)
PA at bedside.

## 2017-07-20 NOTE — ED Notes (Signed)
Admitting Provider at bedside. 

## 2017-07-20 NOTE — Plan of Care (Signed)
Problem: Tissue Perfusion: Goal: Risk factors for ineffective tissue perfusion will decrease Outcome: Progressing 94% on room air

## 2017-07-21 ENCOUNTER — Observation Stay (HOSPITAL_BASED_OUTPATIENT_CLINIC_OR_DEPARTMENT_OTHER): Payer: Medicare Other

## 2017-07-21 DIAGNOSIS — I5030 Unspecified diastolic (congestive) heart failure: Secondary | ICD-10-CM | POA: Diagnosis not present

## 2017-07-21 DIAGNOSIS — Z8249 Family history of ischemic heart disease and other diseases of the circulatory system: Secondary | ICD-10-CM | POA: Diagnosis not present

## 2017-07-21 DIAGNOSIS — R0602 Shortness of breath: Secondary | ICD-10-CM | POA: Diagnosis not present

## 2017-07-21 DIAGNOSIS — R079 Chest pain, unspecified: Secondary | ICD-10-CM | POA: Diagnosis not present

## 2017-07-21 DIAGNOSIS — F419 Anxiety disorder, unspecified: Secondary | ICD-10-CM | POA: Diagnosis not present

## 2017-07-21 DIAGNOSIS — R0789 Other chest pain: Secondary | ICD-10-CM | POA: Diagnosis not present

## 2017-07-21 LAB — ECHOCARDIOGRAM COMPLETE
Height: 70 in
WEIGHTICAEL: 3001.6 [oz_av]

## 2017-07-21 MED ORDER — TRAZODONE HCL 50 MG PO TABS: 50.0000 mg | ORAL_TABLET | Freq: Every evening | ORAL | 0 refills | Status: DC | PRN

## 2017-07-21 MED ORDER — TRAZODONE HCL 50 MG PO TABS: 50.0000 mg | ORAL_TABLET | Freq: Every evening | ORAL | Status: DC | PRN

## 2017-07-21 NOTE — Progress Notes (Signed)
  Echocardiogram 2D Echocardiogram has been performed.  Stacey Herman 07/21/2017, 11:30 AM

## 2017-07-21 NOTE — Discharge Summary (Signed)
Physician Discharge Summary  Stacey Herman POE:[id number] DOB: 04/05/1956 DOA: 07/20/2017  PCP: Rogers Blocker, MD  Admit date: 07/20/2017 Discharge date: 07/21/2017   Recommendations for Outpatient Follow-Up:   1. Outpatient referral to cardiology 2.    Discharge Diagnosis:   Active Problems:   Chest pain   Discharge disposition:  Home.  Discharge Condition: Improved.  Diet recommendation: Low sodium, heart healthy.  Carbohydrate-modified  Wound care: None.   History of Present Illness:   Chest pain that started since 4 AM waking her out of sleep accompanied by diaphoresis and radiation into drawn left arm. Pain is 10 out of 10. He received one nitroglycerin with mild improvement, but had significant drop in blood pressure. Pain currently 4 out of 10. BP 109/71   Pulse 64   Temp 98.2 F (36.8 C) (Oral)   Resp 12   SpO2 93%  alert and oriented 3. No acute distress. Patient appears uncomfortable, which she attributes to her back pain. Lungs clear to auscultation. Regular rate with normal S1 and S2 sounds. No murmurs. Abdomen soft, nontender. Does have reproducible chest pain on exam to the sternum. Left upper thoracic back tender to palpation. Chest pain unlikely to be cardiac, although will rule out given her symptoms radiation into her jaw and arm. Muscle relaxants, serial troponins, Telemetry monitoring   Hospital Course by Problem:   Chest pain syndrome -suspect anxiety  -EKG without evidence of ACS.  -Ce negative -echo: Normal LV size with EF 55%. Normal RV size and systolic function.   No significant valvular abnormalities. -outpatient cardiology follow up  Tobacco abuse   Provide Nicotine gum prn  Counseled cessation    Medical Consultants:    None.   Discharge Exam:   Vitals:   07/21/17 0534 07/21/17 1133  BP: (!) 99/52 119/68  Pulse: 66 72  Resp: 18   Temp: 97.8 F (36.6 C)   SpO2: 98% 96%   Vitals:   07/20/17 1507 07/20/17 1927  07/21/17 0534 07/21/17 1133  BP: 105/67 106/72 (!) 99/52 119/68  Pulse: 78 91 66 72  Resp: 20 18 18    Temp: 98.8 F (37.1 C) 98.4 F (36.9 C) 97.8 F (36.6 C)   TempSrc: Oral Oral Oral   SpO2: 94% 94% 98% 96%  Weight: 84.6 kg (186 lb 7 oz)  85.1 kg (187 lb 9.6 oz)   Height: 5\' 10"  (1.778 m)       Gen:  NAD    The results of significant diagnostics from this hospitalization (including imaging, microbiology, ancillary and laboratory) are listed below for reference.     Procedures and Diagnostic Studies:   Dg Chest 2 View  Result Date: 07/20/2017 CLINICAL DATA:  Mid chest pain. EXAM: CHEST  2 VIEW COMPARISON:  July 07, 2017 FINDINGS: The heart, hila, mediastinum, lungs, and pleura are unchanged. No focal infiltrate, nodule, mass, or overt edema. IMPRESSION: No active cardiopulmonary disease. Electronically Signed   By: Dorise Bullion III M.D   On: 07/20/2017 10:29     Labs:   Basic Metabolic Panel:  Recent Labs Lab 07/20/17 0957  NA 138  K 3.5  CL 101  CO2 29  GLUCOSE 102*  BUN <5*  CREATININE 0.71  CALCIUM 9.2   GFR Estimated Creatinine Clearance: 89.8 mL/min (by C-G formula based on SCr of 0.71 mg/dL). Liver Function Tests: No results for input(s): AST, ALT, ALKPHOS, BILITOT, PROT, ALBUMIN in the last 168 hours. No results for input(s): LIPASE, AMYLASE in the  last 168 hours. No results for input(s): AMMONIA in the last 168 hours. Coagulation profile No results for input(s): INR, PROTIME in the last 168 hours.  CBC:  Recent Labs Lab 07/20/17 0957  WBC 7.3  HGB 14.2  HCT 42.3  MCV 91.0  PLT 199   Cardiac Enzymes:  Recent Labs Lab 07/20/17 1215 07/20/17 1816 07/20/17 2059  TROPONINI <0.03 <0.03 <0.03   BNP: Invalid input(s): POCBNP CBG: No results for input(s): GLUCAP in the last 168 hours. D-Dimer No results for input(s): DDIMER in the last 72 hours. Hgb A1c  Recent Labs  07/20/17 1215  HGBA1C 5.2   Lipid Profile  Recent  Labs  07/20/17 1215  CHOL 166  HDL 49  LDLCALC 101*  TRIG 82  CHOLHDL 3.4   Thyroid function studies No results for input(s): TSH, T4TOTAL, T3FREE, THYROIDAB in the last 72 hours.  Invalid input(s): FREET3 Anemia work up No results for input(s): VITAMINB12, FOLATE, FERRITIN, TIBC, IRON, RETICCTPCT in the last 72 hours. Microbiology No results found for this or any previous visit (from the past 240 hour(s)).   Discharge Instructions:   Discharge Instructions    Diet general    Complete by:  As directed    Increase activity slowly    Complete by:  As directed      Allergies as of 07/21/2017      Reactions   Aspirin Other (See Comments)   Morphine And Related Other (See Comments)   Unknown   Benadryl [diphenhydramine] Other (See Comments)   Unknown   Ciprocin-fluocin-procin [fluocinolone] Other (See Comments)   Unknown   Penicillins Other (See Comments)   Unknown   Sulfa Antibiotics Other (See Comments)   Unknown      Medication List    STOP taking these medications   diazepam 5 MG tablet Commonly known as:  VALIUM   tamsulosin 0.4 MG Caps capsule Commonly known as:  FLOMAX     TAKE these medications   esomeprazole 20 MG capsule Commonly known as:  NEXIUM Take 20 mg by mouth daily.   omeprazole 20 MG tablet Commonly known as:  PRILOSEC OTC Take 20 mg by mouth at bedtime.   oxyCODONE-acetaminophen 10-325 MG tablet Commonly known as:  PERCOCET Take 1 tablet by mouth every 6 (six) hours as needed for pain.   traZODone 50 MG tablet Commonly known as:  DESYREL Take 1 tablet (50 mg total) by mouth at bedtime as needed for sleep.            Discharge Care Instructions        Start     Ordered   07/21/17 0000  traZODone (DESYREL) 50 MG tablet  At bedtime PRN     07/21/17 1709   07/21/17 0000  Increase activity slowly     07/21/17 1709   07/21/17 0000  Diet general     07/21/17 1709     Follow-up Information    Rogers Blocker, MD Follow up in  1 week(s).   Specialty:  Internal Medicine Contact information: [address] [place] [address] [phone]            Time coordinating discharge: 25 min  Signed:  Decari Duggar U Anzal Bartnick   [facility] 07/21/2017, 5:09 PM

## 2017-07-21 NOTE — Care Management Obs Status (Signed)
[facility] NOTIFICATION   Patient Details  Name: Stacey Herman MRN: [id number] Date of Birth: May 23, 1956   Medicare Observation Status Notification Given:  Yes    Erenest Rasher, RN 07/21/2017, 5:57 PM

## 2017-07-25 ENCOUNTER — Encounter (HOSPITAL_COMMUNITY): Payer: Self-pay | Admitting: Emergency Medicine

## 2017-07-25 ENCOUNTER — Emergency Department (HOSPITAL_COMMUNITY)
Admission: EM | Admit: 2017-07-25 | Discharge: 2017-07-25 | Disposition: A | Discharge status: Home / Self Care | Payer: Medicare Other | Attending: Emergency Medicine | Admitting: Emergency Medicine

## 2017-07-25 DIAGNOSIS — M545 Low back pain: Secondary | ICD-10-CM | POA: Insufficient documentation

## 2017-07-25 DIAGNOSIS — R55 Syncope and collapse: Secondary | ICD-10-CM | POA: Diagnosis present

## 2017-07-25 DIAGNOSIS — G8929 Other chronic pain: Secondary | ICD-10-CM | POA: Diagnosis not present

## 2017-07-25 DIAGNOSIS — F17210 Nicotine dependence, cigarettes, uncomplicated: Secondary | ICD-10-CM | POA: Diagnosis not present

## 2017-07-25 DIAGNOSIS — Z79899 Other long term (current) drug therapy: Secondary | ICD-10-CM | POA: Insufficient documentation

## 2017-07-25 HISTORY — DX: Gastro-esophageal reflux disease without esophagitis: K21.9

## 2017-07-25 HISTORY — DX: Calculus of kidney: N20.0

## 2017-07-25 LAB — URINALYSIS, ROUTINE W REFLEX MICROSCOPIC
Bilirubin Urine: NEGATIVE
GLUCOSE, UA: NEGATIVE mg/dL
Ketones, ur: NEGATIVE mg/dL
NITRITE: NEGATIVE
PROTEIN: NEGATIVE mg/dL
SPECIFIC GRAVITY, URINE: 1.009 (ref 1.005–1.030)
pH: 6 (ref 5.0–8.0)

## 2017-07-25 LAB — BASIC METABOLIC PANEL
ANION GAP: 8 (ref 5–15)
BUN: 6 mg/dL (ref 6–20)
CALCIUM: 9.2 mg/dL (ref 8.9–10.3)
CO2: 29 mmol/L (ref 22–32)
CREATININE: 0.69 mg/dL (ref 0.44–1.00)
Chloride: 100 mmol/L — ABNORMAL LOW (ref 101–111)
GFR calc Af Amer: >60 mL/min (ref >60)
GLUCOSE: 94 mg/dL (ref 65–99)
Potassium: 3.5 mmol/L (ref 3.5–5.1)
Sodium: 137 mmol/L (ref 135–145)

## 2017-07-25 LAB — CBC
HCT: 42.3 % (ref 36.0–46.0)
Hemoglobin: 14 g/dL (ref 12.0–15.0)
MCH: 30 pg (ref 26.0–34.0)
MCHC: 33.1 g/dL (ref 30.0–36.0)
MCV: 90.8 fL (ref 78.0–100.0)
PLATELETS: 206 10*3/uL (ref 150–400)
RBC: 4.66 MIL/uL (ref 3.87–5.11)
RDW: 12.7 % (ref 11.5–15.5)
WBC: 6.4 10*3/uL (ref 4.0–10.5)

## 2017-07-25 LAB — CBG MONITORING, ED: GLUCOSE-CAPILLARY: 98 mg/dL (ref 65–99)

## 2017-07-25 MED ORDER — LORAZEPAM 2 MG/ML IJ SOLN
0.5000 mg | Freq: Once | INTRAMUSCULAR | Status: AC
  Administered 2017-07-25: 0.5 mg via INTRAMUSCULAR
  Filled 2017-07-25: qty 1

## 2017-07-25 MED ORDER — KETOROLAC TROMETHAMINE 30 MG/ML IJ SOLN
15.0000 mg | Freq: Once | INTRAMUSCULAR | Status: AC
  Administered 2017-07-25: 15 mg via INTRAMUSCULAR
  Filled 2017-07-25: qty 1

## 2017-07-25 MED ORDER — ACETAMINOPHEN 500 MG PO TABS
1000.0000 mg | ORAL_TABLET | Freq: Once | ORAL | Status: AC
  Administered 2017-07-25: 1000 mg via ORAL
  Filled 2017-07-25: qty 2

## 2017-07-25 NOTE — ED Notes (Signed)
Pt to restroom, able to toilet independently.  Patient not steady on her feet when standing, one assist.

## 2017-07-25 NOTE — ED Provider Notes (Signed)
[facility] DEPT Provider Note   CSN: [id number] Arrival date & time: 07/25/17  1545   History   Chief Complaint Chief Complaint  Patient presents with  . Loss of Consciousness    HPI Stacey Herman is a 61 y.o. female.  With a PMH of chronic back pain who is presenting for evaluation of acute onset back pain and intermittent bilateral lower extremity numbness. The patient states she has chronic back pain at baseline 2/2 multiple previous back surgeries. She states that her back pain is usually rated as a 4-5/10. Today she was walking down the stairs and was attempting to reach for her walker when she all of the sudden "blacked out". She states that her daughter, who lives with her, caught her before she fell to the ground. She denied hitting her head and loss of consciousness. Since this near syncope, her back pain has become worse. It is rated as an 8-9/10. It is described as a sharp, stabbing pain that is bilateral and located her lower back near her tailbone. She also notes intermittent "numbness" in her thighs. She has experienced numbness in both of her thighs and states that moving in certain ways causes her symptoms to occur. She denies loss of bowel/bladder function, worsening weakness in lower extremities, personal history of cancer, and IV drug use.  Patient denies chest pain, shortness of breath, abdominal pain, diaphoresis, nausea/vomiting, lower extremity swelling, and change in bowel/bladder habits.      Past Medical History:  Diagnosis Date  . Acid reflux   . Kidney stones   . Medical history non-contributory   . Renal disorder    Patient Active Problem List   Diagnosis Date Noted  . Chest pain 07/20/2017   Past Surgical History:  Procedure Laterality Date  . APPENDECTOMY    . BACK SURGERY    . CERVICAL FUSION    . TOTAL HIP ARTHROPLASTY     OB History    No data available      Home Medications    Prior to Admission medications   Medication Sig  Start Date End Date Taking? Authorizing Provider  esomeprazole (NEXIUM) 20 MG capsule Take 20 mg by mouth daily.     [provider]  omeprazole (PRILOSEC OTC) 20 MG tablet Take 20 mg by mouth at bedtime.    [provider]  oxyCODONE-acetaminophen (PERCOCET) 10-325 MG tablet Take 1 tablet by mouth every 6 (six) hours as needed for pain. 01/30/17   Orlie Dakin, MD  traZODone (DESYREL) 50 MG tablet Take 1 tablet (50 mg total) by mouth at bedtime as needed for sleep. 07/21/17   Geradine Girt, DO   Family History Family History  Problem Relation Age of Onset  . Cancer Mother   . Heart attack Father   . Heart attack Paternal Grandmother    Social History Social History  Substance Use Topics  . Smoking status: Current Every Day Smoker    Packs/day: 0.25  . Smokeless tobacco: Never Used  . Alcohol use No    Allergies   Aspirin; Morphine and related; Benadryl [diphenhydramine]; Ciprocin-fluocin-procin [fluocinolone]; Penicillins; and Sulfa antibiotics   Review of Systems Review of Systems  Constitutional: Negative for chills and fever.  HENT: Negative for congestion, rhinorrhea and sore throat.   Respiratory: Negative for cough, shortness of breath and wheezing.   Cardiovascular: Negative for chest pain and leg swelling.  Gastrointestinal: Negative for abdominal pain, constipation, diarrhea, nausea, rectal pain and vomiting.  Genitourinary: Negative for dysuria,  frequency and urgency.  Neurological: Positive for syncope and headaches. Negative for dizziness, facial asymmetry, weakness and light-headedness.    Physical Exam Updated Vital Signs BP 114/80   Pulse 78   Temp 97.8 F (36.6 C) (Oral)   Resp 15   Ht 5\' 10"  (1.778 m)   Wt 84.4 kg (186 lb)   SpO2 96%   BMI 26.69 kg/m   Physical Exam  Constitutional: She appears well-developed and well-nourished. No distress.  HENT:  Mouth/Throat: Oropharynx is clear and moist. No oropharyngeal exudate.  Eyes:  Pupils are equal, round, and reactive to light. Conjunctivae and EOM are normal.  Cardiovascular: Normal rate, regular rhythm and intact distal pulses.  Exam reveals no friction rub.   No murmur heard. Pulmonary/Chest: Effort normal. No respiratory distress. She has no wheezes. She has no rales.  Abdominal: Soft. She exhibits no distension. There is no tenderness. There is no guarding.  Musculoskeletal: She exhibits no edema (of bilateral lower extremities) or tenderness (of bilateral lower extremtities).  No point tenderness throughout the spinal column. Tenderness with palpation of paraspinous muscles in the lumbar spinal region.   Lymphadenopathy:    She has no cervical adenopathy.  Neurological:  Face strength and sensation intact bilaterally. Tongue midline. 5/5 bicep, tricep, and grip strength bilaterally. 4/5 R hip flexion and 3/5 L hip flexion. 5/5 dorsiflexion and plantarflexion bilaterally. Gross sensation to light touch of upper and lower extremities intact bilaterally.   Skin: Skin is warm and dry. Capillary refill takes less than 2 seconds. No rash noted. No erythema.   ED Treatments / Results  Labs (all labs ordered are listed, but only abnormal results are displayed) Labs Reviewed  BASIC METABOLIC PANEL - Abnormal; Notable for the following:       Result Value   Chloride 100 (*)    All other components within normal limits  URINALYSIS, ROUTINE W REFLEX MICROSCOPIC - Abnormal; Notable for the following:    Hgb urine dipstick SMALL (*)    Leukocytes, UA SMALL (*)    Bacteria, UA RARE (*)    Squamous Epithelial / LPF 0-5 (*)    All other components within normal limits  CBC  CBG MONITORING, ED   EKG  EKG Interpretation  Date/Time:  Friday July 25 2017 15:53:09 EDT Ventricular Rate:  80 PR Interval:    QRS Duration: 93 QT Interval:  380 QTC Calculation: 439 R Axis:   67 Text Interpretation:  Sinus rhythm Anteroseptal infarct, age indeterminate No significant  change since last tracing no wpw, prolonged qt or brugada Confirmed by Deno Etienne [phone]) on 07/25/2017 4:03:59 PM      Radiology No results found.  Procedures Procedures (including critical care time)  Medications Ordered in ED Medications  ketorolac (TORADOL) 30 MG/ML injection 15 mg (not administered)  acetaminophen (TYLENOL) tablet 1,000 mg (not administered)  LORazepam (ATIVAN) injection 0.5 mg (not administered)    Initial Impression / Assessment and Plan / ED Course  I have reviewed the triage vital signs and the nursing notes.  Pertinent labs & imaging results that were available during my care of the patient were reviewed by me and considered in my medical decision making (see chart for details).  Patient presents with acute low back pain which intermittently causes numbness to the frontal part of her thighs. She does not have any red flag symptoms, including loss of bowel/bladder function, decreased motor funciton,fevers, point tenderness in vertebral column, IV drug use and personal history of cancer, which  would require imaging for her acute on chronic low back pain. Will provide supportive care, including IV tordal and Tylenol/Ativan while in ED. Patient has pain contract for 4 percocet per day. Will not give additional narcotic pain regimen, but rather will suggest patient meet with primary care physician as outpatient. Patient will need to walk with walker on this regimen to see if her pain improves with these interventions.   For near syncope work up, patient's EKG showed normal sinus rhythm. Patient denies dizziness and palpitations at the onset of her symptoms. This together does not suggest cardiac arrhythmia. The patient does not remember why she fell all of the sudden. Patient does not show clinical signs of dehydration. It is most likely the patient had a vasovagal syncope that caused her fall and subsequent aggravation of back pain. Will obtain basic labs, including BMP,  CBC, and urinalysis to look for signs of infection that could be treated.   Final Clinical Impressions(s) / ED Diagnoses   Final diagnoses:  Near syncope  Chronic bilateral low back pain without sciatica   Patient's lab work reassuring that she does not have an infection or electrolyte abnormality. Near syncopal event most likely 2/2 vasovagal event. Patient has ambulated around ED without difficulty. Vital signs stable and within normal limits. Patient ready for discharge. Will be given return precautions and instructions to follow up with PCP for acute on chronic back pain.   New Prescriptions New Prescriptions   No medications on file     Thomasene Ripple, MD 07/25/17 [place], [place], DO 07/25/17 1807

## 2017-07-25 NOTE — ED Triage Notes (Signed)
Per [facility], Pt from home. Pt states she has chronic back and hip pain, she takes Percocet at home for. Pt reports it has worsened starting yesterday, she took a percocet this am with no relief. Pt reports she has also had a headache on and off. Pt uses a walker to get around outside. Pt reports going down stairs when she started to feel dizzy and she blacked out. Pt's daughter caught her before she hit the ground. Pt reports her vision is also a little blurry. Pt alert and oriented x4.

## 2017-07-25 NOTE — Discharge Instructions (Signed)
You were evaluated in the ED for acute on chronic lower back pain and a near syncopal event. Your lab work was reassuring that you do not have an infection or electrolyte abnormality which could have caused your black out this morning. You were given some pain medication to help with your acute on chronic back pain. Please follow up with your primary care physician regarding your back pain. Please review the literature attached to this paper work for exercises to help with your chronic back pain until you can see your primary care provider.  Please return to the ED or seek care with your PCP if you develop fever, chest pain which radiates to your arms or neck, or shortness of breath.

## 2017-08-23 ENCOUNTER — Encounter (HOSPITAL_COMMUNITY): Payer: Self-pay

## 2017-08-23 ENCOUNTER — Emergency Department (HOSPITAL_COMMUNITY): Payer: Medicare Other

## 2017-08-23 ENCOUNTER — Observation Stay (HOSPITAL_COMMUNITY)
Admission: EM | Admit: 2017-08-23 | Discharge: 2017-08-26 | Disposition: A | Discharge status: Home / Self Care | Payer: Medicare Other | Attending: Internal Medicine | Admitting: Internal Medicine

## 2017-08-23 DIAGNOSIS — R0902 Hypoxemia: Secondary | ICD-10-CM | POA: Diagnosis present

## 2017-08-23 DIAGNOSIS — Z880 Allergy status to penicillin: Secondary | ICD-10-CM | POA: Diagnosis not present

## 2017-08-23 DIAGNOSIS — R739 Hyperglycemia, unspecified: Secondary | ICD-10-CM | POA: Diagnosis not present

## 2017-08-23 DIAGNOSIS — Z79899 Other long term (current) drug therapy: Secondary | ICD-10-CM | POA: Diagnosis not present

## 2017-08-23 DIAGNOSIS — J9601 Acute respiratory failure with hypoxia: Secondary | ICD-10-CM | POA: Insufficient documentation

## 2017-08-23 DIAGNOSIS — J441 Chronic obstructive pulmonary disease with (acute) exacerbation: Secondary | ICD-10-CM | POA: Diagnosis not present

## 2017-08-23 DIAGNOSIS — E876 Hypokalemia: Secondary | ICD-10-CM | POA: Diagnosis not present

## 2017-08-23 DIAGNOSIS — J449 Chronic obstructive pulmonary disease, unspecified: Secondary | ICD-10-CM | POA: Diagnosis present

## 2017-08-23 DIAGNOSIS — G8929 Other chronic pain: Secondary | ICD-10-CM | POA: Diagnosis not present

## 2017-08-23 DIAGNOSIS — R6510 Systemic inflammatory response syndrome (SIRS) of non-infectious origin without acute organ dysfunction: Secondary | ICD-10-CM | POA: Diagnosis present

## 2017-08-23 DIAGNOSIS — R7989 Other specified abnormal findings of blood chemistry: Secondary | ICD-10-CM | POA: Diagnosis present

## 2017-08-23 HISTORY — DX: Chronic obstructive pulmonary disease, unspecified: J44.9

## 2017-08-23 LAB — BASIC METABOLIC PANEL
Anion gap: 11 (ref 5–15)
BUN: 10 mg/dL (ref 6–20)
CALCIUM: 9.2 mg/dL (ref 8.9–10.3)
CO2: 28 mmol/L (ref 22–32)
CREATININE: 0.72 mg/dL (ref 0.44–1.00)
Chloride: 98 mmol/L — ABNORMAL LOW (ref 101–111)
GFR calc Af Amer: >60 mL/min (ref >60)
GFR calc non Af Amer: >60 mL/min (ref >60)
GLUCOSE: 118 mg/dL — AB (ref 65–99)
Potassium: 3.3 mmol/L — ABNORMAL LOW (ref 3.5–5.1)
SODIUM: 137 mmol/L (ref 135–145)

## 2017-08-23 LAB — CBC WITH DIFFERENTIAL/PLATELET
BASOS PCT: 0 %
Basophils Absolute: 0 10*3/uL (ref 0.0–0.1)
Eosinophils Absolute: 0 10*3/uL (ref 0.0–0.7)
Eosinophils Relative: 0 %
HEMATOCRIT: 43.7 % (ref 36.0–46.0)
HEMOGLOBIN: 14.7 g/dL (ref 12.0–15.0)
LYMPHS ABS: 1 10*3/uL (ref 0.7–4.0)
LYMPHS PCT: 12 %
MCH: 30.9 pg (ref 26.0–34.0)
MCHC: 33.6 g/dL (ref 30.0–36.0)
MCV: 92 fL (ref 78.0–100.0)
MONOS PCT: 6 %
Monocytes Absolute: 0.6 10*3/uL (ref 0.1–1.0)
NEUTROS ABS: 7 10*3/uL (ref 1.7–7.7)
NEUTROS PCT: 82 %
Platelets: 182 10*3/uL (ref 150–400)
RBC: 4.75 MIL/uL (ref 3.87–5.11)
RDW: 13.1 % (ref 11.5–15.5)
WBC: 8.6 10*3/uL (ref 4.0–10.5)

## 2017-08-23 LAB — LACTIC ACID, PLASMA
LACTIC ACID, VENOUS: 3 mmol/L — AB (ref 0.5–1.9)
Lactic Acid, Venous: 3.1 mmol/L (ref 0.5–1.9)

## 2017-08-23 LAB — CBC
HCT: 42.3 % (ref 36.0–46.0)
Hemoglobin: 14.3 g/dL (ref 12.0–15.0)
MCH: 30.8 pg (ref 26.0–34.0)
MCHC: 33.8 g/dL (ref 30.0–36.0)
MCV: 91 fL (ref 78.0–100.0)
PLATELETS: 180 10*3/uL (ref 150–400)
RBC: 4.65 MIL/uL (ref 3.87–5.11)
RDW: 13.3 % (ref 11.5–15.5)
WBC: 9.1 10*3/uL (ref 4.0–10.5)

## 2017-08-23 LAB — CREATININE, SERUM
CREATININE: 0.68 mg/dL (ref 0.44–1.00)
GFR calc non Af Amer: >60 mL/min (ref >60)

## 2017-08-23 LAB — I-STAT CG4 LACTIC ACID, ED: Lactic Acid, Venous: 2.7 mmol/L (ref 0.5–1.9)

## 2017-08-23 LAB — BRAIN NATRIURETIC PEPTIDE: B Natriuretic Peptide: 22.9 pg/mL (ref 0.0–100.0)

## 2017-08-23 LAB — TROPONIN I

## 2017-08-23 LAB — I-STAT TROPONIN, ED: Troponin i, poc: 0 ng/mL (ref 0.00–0.08)

## 2017-08-23 MED ORDER — OXYCODONE-ACETAMINOPHEN 10-325 MG PO TABS: 1.0000 | ORAL_TABLET | Freq: Four times a day (QID) | ORAL | Status: DC | PRN

## 2017-08-23 MED ORDER — PANTOPRAZOLE SODIUM 40 MG PO TBEC
40.0000 mg | DELAYED_RELEASE_TABLET | Freq: Every day | ORAL | Status: DC
  Administered 2017-08-24 – 2017-08-26 (×3): 40 mg via ORAL
  Filled 2017-08-23 (×3): qty 1

## 2017-08-23 MED ORDER — SODIUM CHLORIDE 0.9 % IV SOLN
Freq: Once | INTRAVENOUS | Status: AC
  Administered 2017-08-23: 17:00:00 via INTRAVENOUS

## 2017-08-23 MED ORDER — ALBUTEROL SULFATE (2.5 MG/3ML) 0.083% IN NEBU
2.5000 mg | INHALATION_SOLUTION | RESPIRATORY_TRACT | Status: DC
  Administered 2017-08-23 (×2): 2.5 mg via RESPIRATORY_TRACT
  Filled 2017-08-23 (×3): qty 3

## 2017-08-23 MED ORDER — TRAZODONE HCL 50 MG PO TABS: 50.0000 mg | ORAL_TABLET | Freq: Every evening | ORAL | Status: DC | PRN

## 2017-08-23 MED ORDER — SODIUM CHLORIDE 0.9 % IV BOLUS (SEPSIS)
1000.0000 mL | Freq: Once | INTRAVENOUS | Status: AC
  Administered 2017-08-23: 1000 mL via INTRAVENOUS

## 2017-08-23 MED ORDER — OXYCODONE HCL 5 MG PO TABS
5.0000 mg | ORAL_TABLET | Freq: Four times a day (QID) | ORAL | Status: DC | PRN
  Administered 2017-08-23: 5 mg via ORAL
  Filled 2017-08-23: qty 1

## 2017-08-23 MED ORDER — ONDANSETRON HCL 4 MG PO TABS: 4.0000 mg | ORAL_TABLET | Freq: Four times a day (QID) | ORAL | Status: DC | PRN

## 2017-08-23 MED ORDER — ONDANSETRON HCL 4 MG/2ML IJ SOLN: 4.0000 mg | Freq: Four times a day (QID) | INTRAMUSCULAR | Status: DC | PRN

## 2017-08-23 MED ORDER — METHYLPREDNISOLONE SODIUM SUCC 125 MG IJ SOLR
125.0000 mg | Freq: Once | INTRAMUSCULAR | Status: AC
  Administered 2017-08-23: 125 mg via INTRAVENOUS
  Filled 2017-08-23: qty 2

## 2017-08-23 MED ORDER — ENOXAPARIN SODIUM 40 MG/0.4ML ~~LOC~~ SOLN
40.0000 mg | SUBCUTANEOUS | Status: DC
  Administered 2017-08-23 – 2017-08-25 (×3): 40 mg via SUBCUTANEOUS
  Filled 2017-08-23 (×3): qty 0.4

## 2017-08-23 MED ORDER — METHYLPREDNISOLONE SODIUM SUCC 40 MG IJ SOLR
40.0000 mg | INTRAMUSCULAR | Status: DC
  Administered 2017-08-24 – 2017-08-25 (×2): 40 mg via INTRAVENOUS
  Filled 2017-08-23 (×2): qty 1

## 2017-08-23 MED ORDER — SODIUM CHLORIDE 0.9 % IV BOLUS (SEPSIS)
500.0000 mL | Freq: Once | INTRAVENOUS | Status: AC
  Administered 2017-08-23: 500 mL via INTRAVENOUS

## 2017-08-23 MED ORDER — ACETAMINOPHEN 650 MG RE SUPP: 650.0000 mg | Freq: Four times a day (QID) | RECTAL | Status: DC | PRN

## 2017-08-23 MED ORDER — IPRATROPIUM-ALBUTEROL 0.5-2.5 (3) MG/3ML IN SOLN
3.0000 mL | Freq: Once | RESPIRATORY_TRACT | Status: AC
  Administered 2017-08-23: 3 mL via RESPIRATORY_TRACT
  Filled 2017-08-23: qty 3

## 2017-08-23 MED ORDER — IPRATROPIUM BROMIDE 0.02 % IN SOLN
0.5000 mg | RESPIRATORY_TRACT | Status: DC
  Administered 2017-08-23 (×2): 0.5 mg via RESPIRATORY_TRACT
  Filled 2017-08-23 (×3): qty 2.5

## 2017-08-23 MED ORDER — POTASSIUM CHLORIDE CRYS ER 20 MEQ PO TBCR
20.0000 meq | EXTENDED_RELEASE_TABLET | Freq: Once | ORAL | Status: AC
  Administered 2017-08-23: 20 meq via ORAL
  Filled 2017-08-23: qty 1

## 2017-08-23 MED ORDER — ACETAMINOPHEN 325 MG PO TABS
650.0000 mg | ORAL_TABLET | Freq: Four times a day (QID) | ORAL | Status: DC | PRN
  Administered 2017-08-24 (×2): 650 mg via ORAL
  Filled 2017-08-23 (×2): qty 2

## 2017-08-23 MED ORDER — ADULT MULTIVITAMIN W/MINERALS CH
1.0000 | ORAL_TABLET | Freq: Every day | ORAL | Status: DC
  Administered 2017-08-24 – 2017-08-26 (×3): 1 via ORAL
  Filled 2017-08-23 (×3): qty 1

## 2017-08-23 MED ORDER — AZITHROMYCIN 250 MG PO TABS
500.0000 mg | ORAL_TABLET | Freq: Once | ORAL | Status: AC
  Administered 2017-08-23: 500 mg via ORAL
  Filled 2017-08-23: qty 2

## 2017-08-23 MED ORDER — ALBUTEROL SULFATE (2.5 MG/3ML) 0.083% IN NEBU: 2.5000 mg | INHALATION_SOLUTION | RESPIRATORY_TRACT | Status: DC | PRN

## 2017-08-23 MED ORDER — IPRATROPIUM-ALBUTEROL 0.5-2.5 (3) MG/3ML IN SOLN: 3.0000 mL | Freq: Once | RESPIRATORY_TRACT | Status: DC

## 2017-08-23 MED ORDER — OXYCODONE-ACETAMINOPHEN 5-325 MG PO TABS
1.0000 | ORAL_TABLET | Freq: Four times a day (QID) | ORAL | Status: DC | PRN
  Administered 2017-08-23 – 2017-08-25 (×5): 1 via ORAL
  Filled 2017-08-23 (×5): qty 1

## 2017-08-23 NOTE — Progress Notes (Signed)
CRITICAL VALUE ALERT  Critical Value:  Lactic Acid 3.1  Date & Time Notied:  10/27   Provider Notified: X. Blount, NP  Orders Received/Actions taken: 1000 mL NS bolus ordered and carried out. Will continue to monitor pt closely. Stacey Herman I

## 2017-08-23 NOTE — ED Notes (Signed)
CALL REPORT TO April at [phone])

## 2017-08-23 NOTE — ED Notes (Signed)
Bed: FH54 Expected date:  Expected time:  Means of arrival:  Comments: 61 yo SOB

## 2017-08-23 NOTE — ED Triage Notes (Signed)
Patient comes from home with c/o sob. Pt have hx of copd. Pt states she get bronchitis at least ounces a year. Chest wall pain and cough since Saturday and continue to get worse. Pt given 10 mg of albuterol and 0.5 Atrovent per ems. Pt alert and oriented x 4.

## 2017-08-23 NOTE — H&P (Signed)
History and Physical    Stacey Herman EXH:[id number] DOB: 05/12/1956 DOA: 08/23/2017  PCP: Rogers Blocker, MD  Patient coming from: Home.  Chief Complaint: Shortness of breath.  HPI: Stacey Herman is a 61 y.o. female with history of COPD ongoing tobacco abuse presents to the ER with complaints of worsening shortness of breath.  Patient states her symptoms started with upper respiratory tract infection symptoms 5 days ago which gradually worsened and became more short of breath with wheezing.  Patient also has some chest tightness on deep breathing.  Denies any fever chills has been having some productive cough.  ED Course: In the ER patient was found to be wheezing with chest x-ray showing nothing acute.  EKG was unremarkable BNP was normal.  Since patient was hypoxic patient is being admitted for COPD exacerbation.  Patient states her chest pressure which she feels like something stuck in the chest has improved after nebulizer treatment.  Review of Systems: As per HPI, rest all negative.   Past Medical History:  Diagnosis Date  . Acid reflux   . COPD (chronic obstructive pulmonary disease) ([place])   . Kidney stones   . Medical history non-contributory   . Renal disorder     Past Surgical History:  Procedure Laterality Date  . APPENDECTOMY    . BACK SURGERY    . CERVICAL FUSION    . TOTAL HIP ARTHROPLASTY       reports that she has been smoking.  She has been smoking about 0.25 packs per day. She has never used smokeless tobacco. She reports that she does not drink alcohol or use drugs.  Allergies  Allergen Reactions  . Aspirin Other (See Comments)  . Morphine And Related Other (See Comments)    Unknown  . Penicillins Anaphylaxis    Unknown Has patient had a PCN reaction causing immediate rash, facial/tongue/throat swelling, SOB or lightheadedness with hypotension: Yes Has patient had a PCN reaction causing severe rash involving mucus membranes or skin necrosis:  Yes Has patient had a PCN reaction that required hospitalization: Yes Has patient had a PCN reaction occurring within the last 10 years: Yes If all of the above answers are "NO", then may proceed with Cephalosporin use.   . Benadryl [Diphenhydramine] Other (See Comments)    Unknown  . Ciprocin-Fluocin-Procin [Fluocinolone] Other (See Comments)    Unknown  . Sulfa Antibiotics Other (See Comments)    Unknown    Family History  Problem Relation Age of Onset  . Cancer Mother   . Heart attack Father   . Heart attack Paternal Grandmother     Prior to Admission medications   Medication Sig Start Date End Date Taking? Authorizing Provider  esomeprazole (NEXIUM) 40 MG capsule Take 40 mg by mouth daily.    Yes [provider]  Multiple Vitamin (MULTIVITAMIN WITH MINERALS) TABS tablet Take 1 tablet by mouth daily.   Yes [provider]  naproxen (NAPROSYN) 250 MG tablet Take 250 mg by mouth daily as needed for muscle spasms. 05/18/17  Yes [provider]  oxyCODONE-acetaminophen (PERCOCET) 10-325 MG tablet Take 1 tablet by mouth every 6 (six) hours as needed for pain. 01/30/17  Yes Orlie Dakin, MD  traZODone (DESYREL) 50 MG tablet Take 1 tablet (50 mg total) by mouth at bedtime as needed for sleep. 07/21/17  Yes Geradine Girt, DO    Physical Exam: Vitals:   08/23/17 1600 08/23/17 1630 08/23/17 1700 08/23/17 1804  BP: 115/71 106/74 (!) 105/59  100/80  Pulse: 96 (!) 101 86 (!) 110  Resp: 20 14 16 20   Temp:    98 F (36.7 C)  TempSrc:    Oral  SpO2:    93%  Weight:    84.7 kg (186 lb 11.7 oz)  Height:    5' 10.5" (1.791 m)      Constitutional: Moderately built and nourished. Vitals:   08/23/17 1600 08/23/17 1630 08/23/17 1700 08/23/17 1804  BP: 115/71 106/74 (!) 105/59 100/80  Pulse: 96 (!) 101 86 (!) 110  Resp: 20 14 16 20   Temp:    98 F (36.7 C)  TempSrc:    Oral  SpO2:    93%  Weight:    84.7 kg (186 lb 11.7 oz)  Height:    5' 10.5" (1.791 m)    Eyes: Anicteric no pallor. ENMT: No discharge from the ears eyes nose or mouth. Neck: No JVD appreciated no mass felt. Respiratory: Bilateral expiratory wheezes no crepitations. Cardiovascular: S1-S2 heard no murmurs appreciated. Abdomen: Soft nontender bowel sounds present. Musculoskeletal: No edema.  No joint effusion. Skin: No rash.  Skin appears warm. Neurologic: Alert awake oriented to time place and person.  Moves all extremities. Psychiatric: Appears normal.  Normal affect.   Labs on Admission: I have personally reviewed following labs and imaging studies  CBC:  Recent Labs Lab 08/23/17 1440  WBC 8.6  NEUTROABS 7.0  HGB 14.7  HCT 43.7  MCV 92.0  PLT 539   Basic Metabolic Panel:  Recent Labs Lab 08/23/17 1440  NA 137  K 3.3*  CL 98*  CO2 28  GLUCOSE 118*  BUN 10  CREATININE 0.72  CALCIUM 9.2   GFR: Estimated Creatinine Clearance: 90.5 mL/min (by C-G formula based on SCr of 0.72 mg/dL). Liver Function Tests: No results for input(s): AST, ALT, ALKPHOS, BILITOT, PROT, ALBUMIN in the last 168 hours. No results for input(s): LIPASE, AMYLASE in the last 168 hours. No results for input(s): AMMONIA in the last 168 hours. Coagulation Profile: No results for input(s): INR, PROTIME in the last 168 hours. Cardiac Enzymes: No results for input(s): CKTOTAL, CKMB, CKMBINDEX, TROPONINI in the last 168 hours. BNP (last 3 results) No results for input(s): PROBNP in the last 8760 hours. HbA1C: No results for input(s): HGBA1C in the last 72 hours. CBG: No results for input(s): GLUCAP in the last 168 hours. Lipid Profile: No results for input(s): CHOL, HDL, LDLCALC, TRIG, CHOLHDL, LDLDIRECT in the last 72 hours. Thyroid Function Tests: No results for input(s): TSH, T4TOTAL, FREET4, T3FREE, THYROIDAB in the last 72 hours. Anemia Panel: No results for input(s): VITAMINB12, FOLATE, FERRITIN, TIBC, IRON, RETICCTPCT in the last 72 hours. Urine analysis:    Component  Value Date/Time   COLORURINE YELLOW 07/25/2017 [place] 07/25/2017 1645   LABSPEC 1.009 07/25/2017 1645   PHURINE 6.0 07/25/2017 1645   GLUCOSEU NEGATIVE 07/25/2017 1645   HGBUR SMALL (A) 07/25/2017 1645   BILIRUBINUR NEGATIVE 07/25/2017 1645   KETONESUR NEGATIVE 07/25/2017 1645   PROTEINUR NEGATIVE 07/25/2017 1645   NITRITE NEGATIVE 07/25/2017 1645   LEUKOCYTESUR SMALL (A) 07/25/2017 1645   Sepsis Labs: @LABRCNTIP (procalcitonin:4,lacticidven:4) )No results found for this or any previous visit (from the past 240 hour(s)).   Radiological Exams on Admission: Dg Chest 2 View  Result Date: 08/23/2017 CLINICAL DATA:  Shortness of breath. EXAM: CHEST  2 VIEW COMPARISON:  07/20/2017 FINDINGS: Hyperinflation and emphysematous markings. There is no edema, consolidation, effusion, or pneumothorax. No acute osseous  finding. Spondylosis. Normal heart size and mediastinal contours. IMPRESSION: COPD without acute superimposed finding. Electronically Signed   By: Monte Fantasia M.D.   On: 08/23/2017 15:30    EKG: Independently reviewed.  Sinus tachycardia LAE  Assessment/Plan Principal Problem:   COPD exacerbation ([place])    1. Acute respiratory failure with hypoxia secondary to COPD exacerbation -patient has been placed on nebulizer treatment.  IV steroids.  Will also keep patient on antibiotics.  Patient is afebrile at this time. 2. Chest pressure likely from COPD and wheezing improved with nebulizer treatment.  Check troponins.  Patient was recently admitted for chest pain and at the time EF was 55% with grade 1 diastolic dysfunction. 3. Tobacco abuse -strongly advised to quit smoking. 4. Chronic back pain -on Percocet.   DVT prophylaxis: Lovenox. Code Status: Full code. Family Communication: Discussed with patient. Disposition Plan: Home. Consults called: None. Admission status: Observation.   Rise Patience MD [facility] Pager [phone].  If  7PM-7AM, please contact night-coverage [email or web address] Password [facility]  08/23/2017, 6:27 PM

## 2017-08-23 NOTE — ED Provider Notes (Signed)
[facility] DEPT Provider Note   CSN: [id number] Arrival date & time: 08/23/17  1237     History   Chief Complaint No chief complaint on file.   HPI Stacey Herman is a 61 y.o. female.  HPI Patient reports he started with a head cold about 4 days ago.  She reports that it is now moving down into her chest.  She reports that she is got harsh coughing and has sputum production.  She does not use home oxygen.  Patient uses inhalers but is not getting any relief.  She does not have nebulizers.  She feels short of breath with it.  Reports she has had some chills but not measured fever.  Reports left-sided anterior chest pain with cough and a constant feeling of pressure in her central lower chest. No Lower extremity swelling or calf pain.  Patient has been cutting back on her smoking.  She is down to 4 cigarettes/day.  Patient has known COPD with exacerbations at least once a year per the patient. Past Medical History:  Diagnosis Date  . Acid reflux   . COPD (chronic obstructive pulmonary disease) ([place])   . Kidney stones   . Medical history non-contributory   . Renal disorder     Patient Active Problem List   Diagnosis Date Noted  . Chest pain 07/20/2017    Past Surgical History:  Procedure Laterality Date  . APPENDECTOMY    . BACK SURGERY    . CERVICAL FUSION    . TOTAL HIP ARTHROPLASTY      OB History    No data available       Home Medications    Prior to Admission medications   Medication Sig Start Date End Date Taking? Authorizing Provider  esomeprazole (NEXIUM) 20 MG capsule Take 20 mg by mouth daily.     [provider]  omeprazole (PRILOSEC OTC) 20 MG tablet Take 20 mg by mouth at bedtime.    [provider]  oxyCODONE-acetaminophen (PERCOCET) 10-325 MG tablet Take 1 tablet by mouth every 6 (six) hours as needed for pain. 01/30/17   Orlie Dakin, MD  traZODone (DESYREL) 50 MG tablet Take 1 tablet (50 mg  total) by mouth at bedtime as needed for sleep. 07/21/17   Geradine Girt, DO    Family History Family History  Problem Relation Age of Onset  . Cancer Mother   . Heart attack Father   . Heart attack Paternal Grandmother     Social History Social History  Substance Use Topics  . Smoking status: Current Every Day Smoker    Packs/day: 0.25  . Smokeless tobacco: Never Used  . Alcohol use No     Allergies   Aspirin; Morphine and related; Benadryl [diphenhydramine]; Ciprocin-fluocin-procin [fluocinolone]; Penicillins; and Sulfa antibiotics   Review of Systems Review of Systems 10 Systems reviewed and are negative for acute change except as noted in the HPI.  Physical Exam Updated Vital Signs BP (!) 102/57 (BP Location: Right Arm)   Pulse (!) 103   Temp 97.9 F (36.6 C) (Oral)   Resp 10   SpO2 91%   Physical Exam  Constitutional: She is oriented to person, place, and time. She appears well-developed and well-nourished. No distress.  Patient is alert and nontoxic.  She does have intermittent harsh cough.  Mild increased work of breathing at rest.  HENT:  Head: Normocephalic and atraumatic.  Right Ear: External ear normal.  Left Ear: External ear normal.  Nose: Nose normal.  Mouth/Throat: Oropharynx is clear and moist.  Eyes: Conjunctivae and EOM are normal.  Neck: Neck supple.  Cardiovascular: Regular rhythm.   No murmur heard. Borderline tachycardia.  Pulmonary/Chest: No respiratory distress.  Intermittent harsh cough.  Adequate airflow at the bases.  Expiratory wheeze bilaterally.  No rhonchi or rales.  Abdominal: Soft. She exhibits no distension. There is no tenderness. There is no guarding.  Musculoskeletal: Normal range of motion. She exhibits no edema or tenderness.  Neurological: She is alert and oriented to person, place, and time. No cranial nerve deficit. She exhibits normal muscle tone. Coordination normal.  Skin: Skin is warm and dry.  Psychiatric: She  has a normal mood and affect.  Nursing note and vitals reviewed.    ED Treatments / Results  Labs (all labs ordered are listed, but only abnormal results are displayed) Labs Reviewed  BASIC METABOLIC PANEL - Abnormal; Notable for the following:       Result Value   Potassium 3.3 (*)    Chloride 98 (*)    Glucose, Bld 118 (*)    All other components within normal limits  I-STAT CG4 LACTIC ACID, ED - Abnormal; Notable for the following:    Lactic Acid, Venous 2.70 (*)    All other components within normal limits  CULTURE, BLOOD (ROUTINE X 2)  CULTURE, BLOOD (ROUTINE X 2)  CBC WITH DIFFERENTIAL/PLATELET  BRAIN NATRIURETIC PEPTIDE  I-STAT TROPONIN, ED  I-STAT CG4 LACTIC ACID, ED    EKG  EKG Interpretation  Date/Time:  Saturday August 23 2017 14:46:22 EDT Ventricular Rate:  103 PR Interval:    QRS Duration: 95 QT Interval:  345 QTC Calculation: 452 R Axis:   43 Text Interpretation:  Sinus tachycardia LAE, consider biatrial enlargement Anteroseptal infarct, age indeterminate no acute ischemia. no sig change from previous Confirmed by Charlesetta Shanks [phone]) on 08/23/2017 4:29:26 PM       Radiology Dg Chest 2 View  Result Date: 08/23/2017 CLINICAL DATA:  Shortness of breath. EXAM: CHEST  2 VIEW COMPARISON:  07/20/2017 FINDINGS: Hyperinflation and emphysematous markings. There is no edema, consolidation, effusion, or pneumothorax. No acute osseous finding. Spondylosis. Normal heart size and mediastinal contours. IMPRESSION: COPD without acute superimposed finding. Electronically Signed   By: Monte Fantasia M.D.   On: 08/23/2017 15:30    Procedures Procedures (including critical care time)  Medications Ordered in ED Medications  ipratropium-albuterol (DUONEB) 0.5-2.5 (3) MG/3ML nebulizer solution 3 mL (not administered)  azithromycin (ZITHROMAX) tablet 500 mg (not administered)  0.9 %  sodium chloride infusion (not administered)  sodium chloride 0.9 % bolus 500 mL  (not administered)  ipratropium-albuterol (DUONEB) 0.5-2.5 (3) MG/3ML nebulizer solution 3 mL (3 mLs Nebulization Given 08/23/17 1458)  methylPREDNISolone sodium succinate (SOLU-MEDROL) 125 mg/2 mL injection 125 mg (125 mg Intravenous Given 08/23/17 1505)     Initial Impression / Assessment and Plan / ED Course  I have reviewed the triage vital signs and the nursing notes.  Pertinent labs & imaging results that were available during my care of the patient were reviewed by me and considered in my medical decision making (see chart for details).    Patient had DuoNeb on route from EMS.  She had 2 additional treatments in the emergency department.  Patient does remain hypoxic to 89% on room air at rest.  Patient does not wear home oxygen.  Final Clinical Impressions(s) / ED Diagnoses   Final diagnoses:  COPD exacerbation ([place])  Hypoxia  Patient was  findings consistent with COPD exacerbation.  She also is describing chest pain with cough and sputum production.  Chest x-ray does not show focal pneumonia but at this time will add Zithromax and treat for COPD exacerbation with hypoxia.  New Prescriptions New Prescriptions   No medications on file     Charlesetta Shanks, MD 08/23/17 1640

## 2017-08-23 NOTE — Progress Notes (Signed)
CRITICAL VALUE ALERT  Critical Value:  Lactic acid 3.0  Date & Time Notied:  08/23/2017 2045  Provider Notified: X. Blount, NP  Orders Received/Actions taken: 1000 mL NS bolus ordered received and carried out. Will continue to monitor pt. Carnella Guadalaj[place] I

## 2017-08-23 NOTE — ED Notes (Signed)
RN AND MD NOTIFIED OF PATIENT'S LACTIC ACID LEVEL OF 2.70

## 2017-08-24 DIAGNOSIS — E876 Hypokalemia: Secondary | ICD-10-CM | POA: Diagnosis not present

## 2017-08-24 DIAGNOSIS — R0902 Hypoxemia: Secondary | ICD-10-CM | POA: Diagnosis not present

## 2017-08-24 DIAGNOSIS — R6510 Systemic inflammatory response syndrome (SIRS) of non-infectious origin without acute organ dysfunction: Secondary | ICD-10-CM | POA: Diagnosis present

## 2017-08-24 DIAGNOSIS — R739 Hyperglycemia, unspecified: Secondary | ICD-10-CM | POA: Diagnosis present

## 2017-08-24 DIAGNOSIS — J441 Chronic obstructive pulmonary disease with (acute) exacerbation: Secondary | ICD-10-CM | POA: Diagnosis not present

## 2017-08-24 DIAGNOSIS — R7989 Other specified abnormal findings of blood chemistry: Secondary | ICD-10-CM | POA: Diagnosis not present

## 2017-08-24 LAB — BASIC METABOLIC PANEL
Anion gap: 9 (ref 5–15)
BUN: 15 mg/dL (ref 6–20)
CALCIUM: 8.7 mg/dL — AB (ref 8.9–10.3)
CO2: 22 mmol/L (ref 22–32)
CREATININE: 0.79 mg/dL (ref 0.44–1.00)
Chloride: 107 mmol/L (ref 101–111)
GFR calc non Af Amer: >60 mL/min (ref >60)
GLUCOSE: 236 mg/dL — AB (ref 65–99)
Potassium: 3.5 mmol/L (ref 3.5–5.1)
Sodium: 138 mmol/L (ref 135–145)

## 2017-08-24 LAB — TROPONIN I

## 2017-08-24 LAB — CBC
HCT: 37.8 % (ref 36.0–46.0)
Hemoglobin: 12.4 g/dL (ref 12.0–15.0)
MCH: 30.2 pg (ref 26.0–34.0)
MCHC: 32.8 g/dL (ref 30.0–36.0)
MCV: 92.2 fL (ref 78.0–100.0)
PLATELETS: 150 10*3/uL (ref 150–400)
RBC: 4.1 MIL/uL (ref 3.87–5.11)
RDW: 13.3 % (ref 11.5–15.5)
WBC: 7.2 10*3/uL (ref 4.0–10.5)

## 2017-08-24 LAB — LACTIC ACID, PLASMA: LACTIC ACID, VENOUS: 1.6 mmol/L (ref 0.5–1.9)

## 2017-08-24 LAB — HIV ANTIBODY (ROUTINE TESTING W REFLEX): HIV Screen 4th Generation wRfx: NONREACTIVE

## 2017-08-24 MED ORDER — SODIUM CHLORIDE 0.9 % IV BOLUS (SEPSIS)
1000.0000 mL | Freq: Once | INTRAVENOUS | Status: AC
  Administered 2017-08-24: 1000 mL via INTRAVENOUS

## 2017-08-24 MED ORDER — IPRATROPIUM-ALBUTEROL 0.5-2.5 (3) MG/3ML IN SOLN
3.0000 mL | Freq: Four times a day (QID) | RESPIRATORY_TRACT | Status: DC
  Administered 2017-08-24: 3 mL via RESPIRATORY_TRACT
  Filled 2017-08-24: qty 3

## 2017-08-24 MED ORDER — SODIUM CHLORIDE 0.9 % IV SOLN: Freq: Once | INTRAVENOUS | Status: AC

## 2017-08-24 MED ORDER — AZITHROMYCIN 250 MG PO TABS
500.0000 mg | ORAL_TABLET | Freq: Every day | ORAL | Status: DC
  Administered 2017-08-24 – 2017-08-25 (×2): 500 mg via ORAL
  Filled 2017-08-24 (×2): qty 2

## 2017-08-24 MED ORDER — NICOTINE 21 MG/24HR TD PT24
21.0000 mg | MEDICATED_PATCH | Freq: Every day | TRANSDERMAL | Status: DC
  Administered 2017-08-24 – 2017-08-26 (×3): 21 mg via TRANSDERMAL
  Filled 2017-08-24 (×3): qty 1

## 2017-08-24 MED ORDER — IPRATROPIUM-ALBUTEROL 0.5-2.5 (3) MG/3ML IN SOLN
3.0000 mL | Freq: Three times a day (TID) | RESPIRATORY_TRACT | Status: DC
  Administered 2017-08-24 – 2017-08-25 (×2): 3 mL via RESPIRATORY_TRACT
  Filled 2017-08-24 (×2): qty 3

## 2017-08-24 MED ORDER — SODIUM CHLORIDE 0.9 % IV BOLUS (SEPSIS)
500.0000 mL | Freq: Once | INTRAVENOUS | Status: AC
  Administered 2017-08-24: 500 mL via INTRAVENOUS

## 2017-08-24 MED ORDER — SODIUM CHLORIDE 0.9 % IV SOLN
INTRAVENOUS | Status: DC
  Administered 2017-08-24: 09:00:00 via INTRAVENOUS

## 2017-08-24 NOTE — Progress Notes (Signed)
PROGRESS NOTE    Stacey Herman  SKA:[id number] DOB: 1956-01-22 DOA: 08/23/2017 PCP: Rogers Blocker, MD   Brief Narrative: Stacey Herman is a 61 y.o. female with history of COPD ongoing tobacco abuse presents to the ER with complaints of worsening shortness of breath.  Assessment & Plan:   Principal Problem:   COPD exacerbation ([place])   Acute respiratory failure with hypoxia secondary to acute copd exacerbation:  IV steroids, duoneb treatments.  Antibiotics for bronchitis.  [place] oxygen to keep sats greater than 90%. Serial troponins are negative.    SIRS: With elevated lactic acid.  Improved with hydration. Lactic acid normalized.     Tobacco abuse:  Counseled and nicotine patch ordered.    Chronic back pain:  Resume percocet.    Hyperglycemia: get HGBA1C.    Hypokalemia: replaced.     DVT prophylaxis: lovenox.  Code Status: full code.  Family Communication: none at bedside, discussed the plan of care with the patient.  Disposition Plan: pending further evaluation.   Consultants:   None.   Procedures:none.   Antimicrobials: zithromax for bronchitis.   Subjective: Anxious and reports headache.   Objective: Vitals:   08/24/17 0513 08/24/17 1105 08/24/17 1210 08/24/17 1342  BP: (!) 88/47  134/63 (!) 113/55  Pulse: 72   (!) 102  Resp: 16   20  Temp: 98.1 F (36.7 C)   98.2 F (36.8 C)  TempSrc: Oral   Oral  SpO2: 97% 99%  97%  Weight:      Height:        Intake/Output Summary (Last 24 hours) at 08/24/17 1538 Last data filed at 08/24/17 1130  Gross per 24 hour  Intake             2230 ml  Output                0 ml  Net             2230 ml   Filed Weights   08/23/17 1804  Weight: 84.7 kg (186 lb 11.7 oz)    Examination:  General exam: Appears calm and comfortable  Respiratory system: Clear to auscultation. Respiratory effort normal. Scattered wheezing.  Cardiovascular system: S1 & S2 heard, RRR. No JVD, murmurs, rubs, gallops or  clicks. No pedal edema. Gastrointestinal system: Abdomen is nondistended, soft and nontender. No organomegaly or masses felt. Normal bowel sounds heard. Central nervous system: Alert and oriented. No focal neurological deficits. Extremities: Symmetric 5 x 5 power. Skin: No rashes, lesions or ulcers Psychiatry: Judgement and insight appear normal. Mood & affect appropriate.     Data Reviewed: I have personally reviewed following labs and imaging studies  CBC:  Recent Labs Lab 08/23/17 1440 08/23/17 1832 08/24/17 0018  WBC 8.6 9.1 7.2  NEUTROABS 7.0  --   --   HGB 14.7 14.3 12.4  HCT 43.7 42.3 37.8  MCV 92.0 91.0 92.2  PLT 182 180 203   Basic Metabolic Panel:  Recent Labs Lab 08/23/17 1440 08/23/17 1832 08/24/17 0018  NA 137  --  138  K 3.3*  --  3.5  CL 98*  --  107  CO2 28  --  22  GLUCOSE 118*  --  236*  BUN 10  --  15  CREATININE 0.72 0.68 0.79  CALCIUM 9.2  --  8.7*   GFR: Estimated Creatinine Clearance: 90.5 mL/min (by C-G formula based on SCr of 0.79 mg/dL). Liver Function Tests: No results for input(s): AST,  ALT, ALKPHOS, BILITOT, PROT, ALBUMIN in the last 168 hours. No results for input(s): LIPASE, AMYLASE in the last 168 hours. No results for input(s): AMMONIA in the last 168 hours. Coagulation Profile: No results for input(s): INR, PROTIME in the last 168 hours. Cardiac Enzymes:  Recent Labs Lab 08/23/17 1832 08/24/17 0018 08/24/17 0533  TROPONINI <0.03 <0.03 <0.03   BNP (last 3 results) No results for input(s): PROBNP in the last 8760 hours. HbA1C: No results for input(s): HGBA1C in the last 72 hours. CBG: No results for input(s): GLUCAP in the last 168 hours. Lipid Profile: No results for input(s): CHOL, HDL, LDLCALC, TRIG, CHOLHDL, LDLDIRECT in the last 72 hours. Thyroid Function Tests: No results for input(s): TSH, T4TOTAL, FREET4, T3FREE, THYROIDAB in the last 72 hours. Anemia Panel: No results for input(s): VITAMINB12, FOLATE,  FERRITIN, TIBC, IRON, RETICCTPCT in the last 72 hours. Sepsis Labs:  Recent Labs Lab 08/23/17 1619 08/23/17 1937 08/23/17 2211 08/24/17 0533  LATICACIDVEN 2.70* 3.0* 3.1* 1.6    No results found for this or any previous visit (from the past 240 hour(s)).       Radiology Studies: Dg Chest 2 View  Result Date: 08/23/2017 CLINICAL DATA:  Shortness of breath. EXAM: CHEST  2 VIEW COMPARISON:  07/20/2017 FINDINGS: Hyperinflation and emphysematous markings. There is no edema, consolidation, effusion, or pneumothorax. No acute osseous finding. Spondylosis. Normal heart size and mediastinal contours. IMPRESSION: COPD without acute superimposed finding. Electronically Signed   By: Monte Fantasia M.D.   On: 08/23/2017 15:30        Scheduled Meds: . azithromycin  500 mg Oral Daily  . enoxaparin (LOVENOX) injection  40 mg Subcutaneous Q24H  . ipratropium-albuterol  3 mL Nebulization TID  . methylPREDNISolone (SOLU-MEDROL) injection  40 mg Intravenous Q24H  . multivitamin with minerals  1 tablet Oral Daily  . nicotine  21 mg Transdermal Daily  . pantoprazole  40 mg Oral Daily   Continuous Infusions: . sodium chloride 75 mL/hr at 08/24/17 1229     LOS: 0 days    Time spent: 37 min   Huel Centola, MD [facility] Pager [phone]  If 7PM-7AM, please contact night-coverage [email or web address] Password [facility] 08/24/2017, 3:38 PM

## 2017-08-25 DIAGNOSIS — R7989 Other specified abnormal findings of blood chemistry: Secondary | ICD-10-CM

## 2017-08-25 DIAGNOSIS — J441 Chronic obstructive pulmonary disease with (acute) exacerbation: Secondary | ICD-10-CM | POA: Diagnosis not present

## 2017-08-25 DIAGNOSIS — E876 Hypokalemia: Secondary | ICD-10-CM | POA: Diagnosis not present

## 2017-08-25 DIAGNOSIS — R739 Hyperglycemia, unspecified: Secondary | ICD-10-CM | POA: Diagnosis not present

## 2017-08-25 LAB — EXPECTORATED SPUTUM ASSESSMENT W GRAM STAIN, RFLX TO RESP C

## 2017-08-25 LAB — HEMOGLOBIN A1C
HEMOGLOBIN A1C: 5.2 % (ref 4.8–5.6)
MEAN PLASMA GLUCOSE: 102.54 mg/dL

## 2017-08-25 LAB — BASIC METABOLIC PANEL
ANION GAP: 9 (ref 5–15)
BUN: 14 mg/dL (ref 6–20)
CALCIUM: 9.4 mg/dL (ref 8.9–10.3)
CO2: 29 mmol/L (ref 22–32)
CREATININE: 0.82 mg/dL (ref 0.44–1.00)
Chloride: 105 mmol/L (ref 101–111)
GFR calc Af Amer: >60 mL/min (ref >60)
GFR calc non Af Amer: >60 mL/min (ref >60)
GLUCOSE: 108 mg/dL — AB (ref 65–99)
Potassium: 4.2 mmol/L (ref 3.5–5.1)
Sodium: 143 mmol/L (ref 135–145)

## 2017-08-25 LAB — EXPECTORATED SPUTUM ASSESSMENT W REFEX TO RESP CULTURE

## 2017-08-25 MED ORDER — LEVOFLOXACIN 500 MG PO TABS: 500.0000 mg | ORAL_TABLET | Freq: Every day | ORAL | Status: DC

## 2017-08-25 MED ORDER — POLYETHYLENE GLYCOL 3350 17 G PO PACK
17.0000 g | PACK | Freq: Every day | ORAL | Status: DC
  Administered 2017-08-25 – 2017-08-26 (×2): 17 g via ORAL
  Filled 2017-08-25 (×2): qty 1

## 2017-08-25 MED ORDER — SENNOSIDES-DOCUSATE SODIUM 8.6-50 MG PO TABS
2.0000 | ORAL_TABLET | Freq: Two times a day (BID) | ORAL | Status: DC
  Administered 2017-08-25 – 2017-08-26 (×3): 2 via ORAL
  Filled 2017-08-25 (×3): qty 2

## 2017-08-25 MED ORDER — IPRATROPIUM-ALBUTEROL 0.5-2.5 (3) MG/3ML IN SOLN
3.0000 mL | Freq: Four times a day (QID) | RESPIRATORY_TRACT | Status: DC
  Administered 2017-08-25 – 2017-08-26 (×3): 3 mL via RESPIRATORY_TRACT
  Filled 2017-08-25 (×4): qty 3

## 2017-08-25 MED ORDER — METHYLPREDNISOLONE SODIUM SUCC 40 MG IJ SOLR
40.0000 mg | Freq: Two times a day (BID) | INTRAMUSCULAR | Status: DC
  Administered 2017-08-25 – 2017-08-26 (×2): 40 mg via INTRAVENOUS
  Filled 2017-08-25 (×2): qty 1

## 2017-08-25 NOTE — Evaluation (Addendum)
Physical Therapy Evaluation Patient Details Name: Stacey Herman MRN: [id number] DOB: 1956-02-13 Today's Date: 08/25/2017   History of Present Illness  Stacey Herman is a 61 y.o. female with history of COPD ongoing tobacco abuse presents to the ER with complaints of worsening shortness of breath.   Clinical Impression  Patient evaluated by Physical Therapy with no further acute PT needs identified. All education has been completed and the patient has no further questions.  See below for any follow-up Physical Therapy or equipment needs. PT is signing off. Thank you for this referral. Encouraged pt to gradually work up to her normal level of activity, use RW as needed; pt verbalizes good understanding     Follow Up Recommendations No PT follow up    Equipment Recommendations  None recommended by PT    Recommendations for Other Services       Precautions / Restrictions Precautions Precautions: Fall      Mobility  Bed Mobility Overal bed mobility: Modified Independent                Transfers Overall transfer level: Modified independent Equipment used: None                Ambulation/Gait Ambulation/Gait assistance: Supervision Ambulation Distance (Feet): 360 Feet Assistive device: Rolling walker (2 wheeled) Gait Pattern/deviations: Step-through pattern     General Gait Details: 10' more without AD; cues only for RW safety with turns9pt has rollator at home); O2 sats 91-94% on RA, HR 111 max; 2-3/4 DOE end of distance  Financial trader Rankin (Stroke Patients Only)       Balance Overall balance assessment: Needs assistance   Sitting balance-Leahy Scale: Normal       Standing balance-Leahy Scale: Good               High level balance activites: Side stepping;Turns;Head turns High Level Balance Comments: no LOB, denies falls             Pertinent Vitals/Pain Pain Assessment: No/denies pain     Home Living Family/patient expects to be discharged to:: Private residence               Home Equipment: Walker - 4 wheels Additional Comments: uses RW only occasionally if feeling "unsteady"    Prior Function Level of Independence: Independent         Comments: very active, walks at least 68mile per day at the track     Hand Dominance        Extremity/Trunk Assessment   Upper Extremity Assessment Upper Extremity Assessment: Overall WFL for tasks assessed    Lower Extremity Assessment Lower Extremity Assessment: Overall WFL for tasks assessed       Communication   Communication: No difficulties  Cognition Arousal/Alertness: Awake/alert Behavior During Therapy: WFL for tasks assessed/performed Overall Cognitive Status: Within Functional Limits for tasks assessed                                        General Comments      Exercises     Assessment/Plan    PT Assessment Patent does not need any further PT services  PT Problem List         PT Treatment Interventions      PT Goals (Current goals can be found in  the Care Plan section)  Acute Rehab PT Goals Patient Stated Goal: to go home and feel better PT Goal Formulation: All assessment and education complete, DC therapy    Frequency     Barriers to discharge        Co-evaluation               AM-PAC PT "6 Clicks" Daily Activity  Outcome Measure Difficulty turning over in bed (including adjusting bedclothes, sheets and blankets)?: A Little Difficulty moving from lying on back to sitting on the side of the bed? : A Little Difficulty sitting down on and standing up from a chair with arms (e.g., wheelchair, bedside commode, etc,.)?: A Little Help needed moving to and from a bed to chair (including a wheelchair)?: None Help needed walking in hospital room?: None Help needed climbing 3-5 steps with a railing? : A Little 6 Click Score: 20    End of Session Equipment  Utilized During Treatment: Gait belt Activity Tolerance: Patient tolerated treatment well Patient left: in bed;with call bell/phone within reach;Other (comment) (lab tech)   PT Visit Diagnosis: Difficulty in walking, not elsewhere classified (R26.2)    Time: 7017-7939 PT Time Calculation (min) (ACUTE ONLY): 9 min   Charges:   PT Evaluation $PT Eval Low Complexity: 1 Low     PT G Codes:   PT G-Codes **NOT FOR INPATIENT CLASS** Functional Assessment Tool Used: AM-PAC 6 Clicks Basic Mobility;Clinical judgement Functional Limitation: Mobility: Walking and moving around Mobility: Walking and Moving Around Current Status (Q3009): At least 1 percent but less than 20 percent impaired, limited or restricted Mobility: Walking and Moving Around Goal Status [phone]): At least 1 percent but less than 20 percent impaired, limited or restricted Mobility: Walking and Moving Around Discharge Status [phone]): At least 1 percent but less than 20 percent impaired, limited or restricted    Kenyon Ana, PT Pager: [phone] 08/25/2017   [facility] 08/25/2017, 3:10 PM

## 2017-08-25 NOTE — Progress Notes (Addendum)
PROGRESS NOTE    Stacey Herman  YQM:[id number] DOB: Jul 08, 1956 DOA: 08/23/2017 PCP: Rogers Blocker, MD   Brief Narrative: Stacey Herman is a 61 y.o. female with history of COPD ongoing tobacco abuse presents to the ER with complaints of worsening shortness of breath.  Assessment & Plan:   Principal Problem:   COPD exacerbation ([place]) Active Problems:   SIRS (systemic inflammatory response syndrome) ([facility])   Elevated lactic acid level   Hypokalemia   Hyperglycemia   Acute respiratory failure with hypoxia secondary to acute copd exacerbation:  IV steroids, duoneb treatments. Start tapering steroids. Plan for a quick taper on discharge.  Pt reports worsening cough and sob today. Sputum cultures ordered and changed antibiotic to levaquin.  [place] oxygen to keep sats greater than 90%. Serial troponins are negative.   PT evaluation pending.   SIRS: With elevated lactic acid.  Improved with hydration. Lactic acid normalized.     Tobacco abuse:  Counseled and nicotine patch ordered.    Chronic back pain:  Resume percocet.    Hyperglycemia: get HGBA1C.    Hypokalemia: replaced.     DVT prophylaxis: lovenox.  Code Status: full code.  Family Communication: none at bedside, discussed the plan of care with the patient.  Disposition Plan: home in am.   Consultants:   None.   Procedures:none.   Antimicrobials: levaquin  Subjective: Reports worsening cough and sob.   Objective: Vitals:   08/24/17 2004 08/25/17 0535 08/25/17 0743 08/25/17 1126  BP: 116/64 115/90    Pulse: 70 62    Resp: 20 20    Temp: 98.1 F (36.7 C) 97.8 F (36.6 C)    TempSrc: Oral Oral    SpO2: 94% 97% 99% 99%  Weight:      Height:        Intake/Output Summary (Last 24 hours) at 08/25/17 1437 Last data filed at 08/25/17 0600  Gross per 24 hour  Intake          1819.58 ml  Output                0 ml  Net          1819.58 ml   Filed Weights   08/23/17 1804  Weight: 84.7 kg (186  lb 11.7 oz)    Examination:  General exam: Appears calm and comfortable  Respiratory system:air entry fair, scattered wheezing heard.  Cardiovascular system: S1 & S2 heard,RRR, no JVD, no  [place], No pedal edema. Gastrointestinal system: Abdomen is soft NT nd bs+ Central nervous system: Alert and oriented. No focal neurological deficits. Extremities: Symmetric 5 x 5 power. Skin: No rashes, lesions or ulcers Psychiatry: Mood & affect appropriate.     Data Reviewed: I have personally reviewed following labs and imaging studies  CBC:  Recent Labs Lab 08/23/17 1440 08/23/17 1832 08/24/17 0018  WBC 8.6 9.1 7.2  NEUTROABS 7.0  --   --   HGB 14.7 14.3 12.4  HCT 43.7 42.3 37.8  MCV 92.0 91.0 92.2  PLT 182 180 528   Basic Metabolic Panel:  Recent Labs Lab 08/23/17 1440 08/23/17 1832 08/24/17 0018 08/25/17 0938  NA 137  --  138 143  K 3.3*  --  3.5 4.2  CL 98*  --  107 105  CO2 28  --  22 29  GLUCOSE 118*  --  236* 108*  BUN 10  --  15 14  CREATININE 0.72 0.68 0.79 0.82  CALCIUM 9.2  --  8.7* 9.4   GFR: Estimated Creatinine Clearance: 88.3 mL/min (by C-G formula based on SCr of 0.82 mg/dL). Liver Function Tests: No results for input(s): AST, ALT, ALKPHOS, BILITOT, PROT, ALBUMIN in the last 168 hours. No results for input(s): LIPASE, AMYLASE in the last 168 hours. No results for input(s): AMMONIA in the last 168 hours. Coagulation Profile: No results for input(s): INR, PROTIME in the last 168 hours. Cardiac Enzymes:  Recent Labs Lab 08/23/17 1832 08/24/17 0018 08/24/17 0533  TROPONINI <0.03 <0.03 <0.03   BNP (last 3 results) No results for input(s): PROBNP in the last 8760 hours. HbA1C: No results for input(s): HGBA1C in the last 72 hours. CBG: No results for input(s): GLUCAP in the last 168 hours. Lipid Profile: No results for input(s): CHOL, HDL, LDLCALC, TRIG, CHOLHDL, LDLDIRECT in the last 72 hours. Thyroid Function Tests: No results for input(s):  TSH, T4TOTAL, FREET4, T3FREE, THYROIDAB in the last 72 hours. Anemia Panel: No results for input(s): VITAMINB12, FOLATE, FERRITIN, TIBC, IRON, RETICCTPCT in the last 72 hours. Sepsis Labs:  Recent Labs Lab 08/23/17 1619 08/23/17 1937 08/23/17 2211 08/24/17 0533  LATICACIDVEN 2.70* 3.0* 3.1* 1.6    Recent Results (from the past 240 hour(s))  Culture, blood (routine x 2)     Status: None (Preliminary result)   Collection Time: 08/23/17  2:46 PM  Result Value Ref Range Status   Specimen Description BLOOD LEFT HAND  Final   Special Requests   Final    BOTTLES DRAWN AEROBIC AND ANAEROBIC Blood Culture adequate volume   Culture   Final    NO GROWTH 1 DAY Performed at [facility], [place] [address], [place], [place] [address]    Report Status PENDING  Incomplete  Culture, blood (routine x 2)     Status: None (Preliminary result)   Collection Time: 08/23/17  4:05 PM  Result Value Ref Range Status   Specimen Description BLOOD RIGHT ANTECUBITAL  Final   Special Requests   Final    BOTTLES DRAWN AEROBIC AND ANAEROBIC Blood Culture adequate volume   Culture   Final    NO GROWTH 1 DAY Performed at [facility], [place] [address] [place], [place] [address]    Report Status PENDING  Incomplete  Culture, expectorated sputum-assessment     Status: None   Collection Time: 08/25/17 11:39 AM  Result Value Ref Range Status   Specimen Description EXPECTORATED SPUTUM  Final   Special Requests NONE  Final   Sputum evaluation THIS SPECIMEN IS ACCEPTABLE FOR SPUTUM CULTURE  Final   Report Status 08/25/2017 FINAL  Final         Radiology Studies: Dg Chest 2 View  Result Date: 08/23/2017 CLINICAL DATA:  Shortness of breath. EXAM: CHEST  2 VIEW COMPARISON:  07/20/2017 FINDINGS: Hyperinflation and emphysematous markings. There is no edema, consolidation, effusion, or pneumothorax. No acute osseous finding. Spondylosis. Normal heart size and mediastinal contours. IMPRESSION: COPD  without acute superimposed finding. Electronically Signed   By: Monte Fantasia M.D.   On: 08/23/2017 15:30        Scheduled Meds: . enoxaparin (LOVENOX) injection  40 mg Subcutaneous Q24H  . ipratropium-albuterol  3 mL Nebulization Q6H  . [START ON 08/26/2017] levofloxacin  500 mg Oral Daily  . methylPREDNISolone (SOLU-MEDROL) injection  40 mg Intravenous Q12H  . multivitamin with minerals  1 tablet Oral Daily  . nicotine  21 mg Transdermal Daily  . pantoprazole  40 mg Oral Daily  . polyethylene  glycol  17 g Oral Daily  . senna-docusate  2 tablet Oral BID   Continuous Infusions:    LOS: 0 days    Time spent: 5 min   Abrar Bilton, MD [facility] Pager [phone]  If 7PM-7AM, please contact night-coverage [email or web address] Password [facility] 08/25/2017, 2:37 PM

## 2017-08-25 NOTE — Care Management Note (Signed)
Case Management Note  Patient Details  Name: Stacey Herman MRN: [id number] Date of Birth: 03-27-1956  Subjective/Objective:  61 y/o f admitted w/COPD. From home.                  Action/Plan:d/c plan home.   Expected Discharge Date:                  Expected Discharge Plan:  Home/Self Care  In-House Referral:     Discharge planning Services  CM Consult  Post Acute Care Choice:    Choice offered to:     DME Arranged:    DME Agency:     HH Arranged:    HH Agency:     Status of Service:  In process, will continue to follow  If discussed at Long Length of Stay Meetings, dates discussed:    Additional Comments:  Dessa Phi, RN 08/25/2017, 11:47 AM

## 2017-08-25 NOTE — Care Management Obs Status (Signed)
[facility] NOTIFICATION   Patient Details  Name: Deashia Soule MRN: [id number] Date of Birth: 29-Aug-1956   Medicare Observation Status Notification Given:  Yes    MahabirJuliann Pulse, RN 08/25/2017, 2:23 PM

## 2017-08-26 DIAGNOSIS — J441 Chronic obstructive pulmonary disease with (acute) exacerbation: Secondary | ICD-10-CM | POA: Diagnosis not present

## 2017-08-26 DIAGNOSIS — E876 Hypokalemia: Secondary | ICD-10-CM | POA: Diagnosis not present

## 2017-08-26 DIAGNOSIS — R7989 Other specified abnormal findings of blood chemistry: Secondary | ICD-10-CM | POA: Diagnosis not present

## 2017-08-26 LAB — BASIC METABOLIC PANEL
ANION GAP: 9 (ref 5–15)
BUN: 14 mg/dL (ref 6–20)
CALCIUM: 9.2 mg/dL (ref 8.9–10.3)
CO2: 30 mmol/L (ref 22–32)
Chloride: 101 mmol/L (ref 101–111)
Creatinine, Ser: 0.67 mg/dL (ref 0.44–1.00)
Glucose, Bld: 128 mg/dL — ABNORMAL HIGH (ref 65–99)
POTASSIUM: 4.1 mmol/L (ref 3.5–5.1)
SODIUM: 140 mmol/L (ref 135–145)

## 2017-08-26 LAB — CBC
HCT: 37.2 % (ref 36.0–46.0)
Hemoglobin: 12.2 g/dL (ref 12.0–15.0)
MCH: 30 pg (ref 26.0–34.0)
MCHC: 32.8 g/dL (ref 30.0–36.0)
MCV: 91.6 fL (ref 78.0–100.0)
PLATELETS: 171 10*3/uL (ref 150–400)
RBC: 4.06 MIL/uL (ref 3.87–5.11)
RDW: 13.3 % (ref 11.5–15.5)
WBC: 9 10*3/uL (ref 4.0–10.5)

## 2017-08-26 MED ORDER — AZITHROMYCIN 250 MG PO TABS: 500.0000 mg | ORAL_TABLET | Freq: Every day | ORAL | 0 refills | Status: DC

## 2017-08-26 MED ORDER — NICOTINE 21 MG/24HR TD PT24: 21.0000 mg | MEDICATED_PATCH | Freq: Every day | TRANSDERMAL | 0 refills | Status: DC

## 2017-08-26 MED ORDER — AZITHROMYCIN 250 MG PO TABS: 500.0000 mg | ORAL_TABLET | Freq: Every day | ORAL | 0 refills | Status: AC

## 2017-08-26 MED ORDER — AZITHROMYCIN 250 MG PO TABS
500.0000 mg | ORAL_TABLET | Freq: Every day | ORAL | Status: DC
  Administered 2017-08-26: 500 mg via ORAL
  Filled 2017-08-26: qty 2

## 2017-08-26 MED ORDER — ALBUTEROL SULFATE (2.5 MG/3ML) 0.083% IN NEBU: 2.5000 mg | INHALATION_SOLUTION | Freq: Four times a day (QID) | RESPIRATORY_TRACT | 12 refills | Status: AC | PRN

## 2017-08-26 MED ORDER — IPRATROPIUM-ALBUTEROL 0.5-2.5 (3) MG/3ML IN SOLN: 3.0000 mL | Freq: Four times a day (QID) | RESPIRATORY_TRACT | 1 refills | Status: DC | PRN

## 2017-08-26 MED ORDER — PREDNISONE 10 MG (21) PO TBPK: ORAL_TABLET | ORAL | 0 refills | Status: DC

## 2017-08-26 MED ORDER — ALBUTEROL SULFATE (2.5 MG/3ML) 0.083% IN NEBU: 2.5000 mg | INHALATION_SOLUTION | RESPIRATORY_TRACT | 12 refills | Status: DC | PRN

## 2017-08-26 MED ORDER — IPRATROPIUM-ALBUTEROL 0.5-2.5 (3) MG/3ML IN SOLN
3.0000 mL | Freq: Four times a day (QID) | RESPIRATORY_TRACT | 1 refills | Status: DC | PRN
Start: 2017-08-26 — End: 2018-08-03

## 2017-08-26 NOTE — Progress Notes (Addendum)
Noted Dr has just ordered neb machine-patient has already d/c from hospital-I have contacted [employer] rep Santiago Glad to see if she can call patient, & have delivered to her [facility] rep Santiago Glad will complete process. No further CM needs.

## 2017-08-26 NOTE — Progress Notes (Signed)
Patient remains A&Ox4, ambulatory without assist. Discharge instructions reviewed, questions concern denied. Hard copy prescription given, pt given instructions about additional medications that were called into the pharmacy.

## 2017-08-27 LAB — CULTURE, RESPIRATORY W GRAM STAIN

## 2017-08-27 LAB — CULTURE, RESPIRATORY: CULTURE: NORMAL

## 2017-08-29 LAB — CULTURE, BLOOD (ROUTINE X 2)
CULTURE: NO GROWTH
CULTURE: NO GROWTH
SPECIAL REQUESTS: ADEQUATE
Special Requests: ADEQUATE

## 2017-09-01 NOTE — Discharge Summary (Signed)
Physician Discharge Summary  Stacey Herman VOH:[id number] DOB: 06-19-1956 DOA: 08/23/2017  PCP: Rogers Blocker, MD  Admit date: 08/23/2017 Discharge date: 08/26/2017  Admitted From: Home.  Disposition:  Home  Recommendations for Outpatient Follow-up:  1. Follow up with PCP in 1-2 weeks 2. Please obtain BMP/CBC in one week     Discharge Condition:stable.  CODE STATUS: full code.  Diet recommendation: Heart Healthy   Brief/Interim Summary: Aritzel Krusemark a 61 y.o.femalewith history of COPD ongoing tobacco abuse presents to the ER with complaints of worsening shortness of breath.    Discharge Diagnoses:  Principal Problem:   COPD exacerbation ([place]) Active Problems:   SIRS (systemic inflammatory response syndrome) (HCC)   Elevated lactic acid level   Hypokalemia   Hyperglycemia   Acute respiratory failure with hypoxia secondary to acute copd exacerbation:  IV steroids, duoneb treatments. Start tapering steroids. Plan for a quick taper on discharge.  Sputum cultures ordered and recommend follow up with PCP as outpatient. and changed antibiotic to levaquin on discharge.  Serial troponins are negative.     SIRS: With elevated lactic acid.  Improved with hydration. Lactic acid normalized.     Tobacco abuse:  Counseled and nicotine patch ordered.    Chronic back pain:  Resume percocet.    Hyperglycemia: hgba1c is 5.2   Hypokalemia: replaced.       Discharge Instructions  Discharge Instructions    Diet - low sodium heart healthy   Complete by:  As directed    Discharge instructions   Complete by:  As directed    Follow up with PCP in one week.  Please stop smoking.     Allergies as of 08/26/2017      Reactions   Aspirin Other (See Comments)   Morphine And Related Other (See Comments)   Unknown   Penicillins Anaphylaxis   Unknown Has patient had a PCN reaction causing immediate rash, facial/tongue/throat swelling, SOB or  lightheadedness with hypotension: Yes Has patient had a PCN reaction causing severe rash involving mucus membranes or skin necrosis: Yes Has patient had a PCN reaction that required hospitalization: Yes Has patient had a PCN reaction occurring within the last 10 years: Yes If all of the above answers are "NO", then may proceed with Cephalosporin use.   Benadryl [diphenhydramine] Other (See Comments)   Unknown   Ciprocin-fluocin-procin [fluocinolone] Other (See Comments)   Unknown   Sulfa Antibiotics Other (See Comments)   Unknown      Medication List    STOP taking these medications   naproxen 250 MG tablet Commonly known as:  NAPROSYN     TAKE these medications   albuterol (2.5 MG/3ML) 0.083% nebulizer solution Commonly known as:  PROVENTIL Take 3 mLs (2.5 mg total) by nebulization every 6 (six) hours as needed for wheezing.   esomeprazole 40 MG capsule Commonly known as:  NEXIUM Take 40 mg by mouth daily.   ipratropium-albuterol 0.5-2.5 (3) MG/3ML Soln Commonly known as:  DUONEB Take 3 mLs by nebulization every 6 (six) hours as needed.   multivitamin with minerals Tabs tablet Take 1 tablet by mouth daily.   nicotine 21 mg/24hr patch Commonly known as:  NICODERM CQ - dosed in mg/24 hours Place 1 patch (21 mg total) onto the skin daily.   oxyCODONE-acetaminophen 10-325 MG tablet Commonly known as:  PERCOCET Take 1 tablet by mouth every 6 (six) hours as needed for pain. Notes to patient:  DO NOT TAKE IN COMBINATION WITH ANY OTHER PAIN MEDICATIONS,  OVER THE COUNTER OR OTHERWISE.   predniSONE 10 MG (21) Tbpk tablet Commonly known as:  STERAPRED UNI-PAK 21 TAB Prednisone 40 mg daily for 3 days followed by  Prednisone 30 mg daily for 3 days followed by  Prednisone 20 mg daily for 3 days.   traZODone 50 MG tablet Commonly known as:  DESYREL Take 1 tablet (50 mg total) by mouth at bedtime as needed for sleep.     ASK your doctor about these medications   azithromycin  250 MG tablet Commonly known as:  ZITHROMAX Take 2 tablets (500 mg total) by mouth daily. Ask about: Should I take this medication?            Durable Medical Equipment  (From admission, onward)        Start     Ordered   08/26/17 1518  For home use only DME Nebulizer machine  Once    Question:  Patient needs a nebulizer to treat with the following condition  Answer:  COPD with acute exacerbation ([place])   08/26/17 1517     Follow-up Information    Rogers Blocker, MD. Schedule an appointment as soon as possible for a visit in 1 week(s).   Specialty:  Internal Medicine Contact information: [place] [address] [phone]          Allergies  Allergen Reactions  . Aspirin Other (See Comments)  . Morphine And Related Other (See Comments)    Unknown  . Penicillins Anaphylaxis    Unknown Has patient had a PCN reaction causing immediate rash, facial/tongue/throat swelling, SOB or lightheadedness with hypotension: Yes Has patient had a PCN reaction causing severe rash involving mucus membranes or skin necrosis: Yes Has patient had a PCN reaction that required hospitalization: Yes Has patient had a PCN reaction occurring within the last 10 years: Yes If all of the above answers are "NO", then may proceed with Cephalosporin use.   . Benadryl [Diphenhydramine] Other (See Comments)    Unknown  . Ciprocin-Fluocin-Procin [Fluocinolone] Other (See Comments)    Unknown  . Sulfa Antibiotics Other (See Comments)    Unknown    Consultations:  None.   Procedures/Studies: Dg Chest 2 View  Result Date: 08/23/2017 CLINICAL DATA:  Shortness of breath. EXAM: CHEST  2 VIEW COMPARISON:  07/20/2017 FINDINGS: Hyperinflation and emphysematous markings. There is no edema, consolidation, effusion, or pneumothorax. No acute osseous finding. Spondylosis. Normal heart size and mediastinal contours. IMPRESSION: COPD without acute superimposed finding.  Electronically Signed   By: Monte Fantasia M.D.   On: 08/23/2017 15:30       Subjective: No new complaints.   Discharge Exam: Vitals:   08/26/17 0208 08/26/17 0512  BP: (!) 108/56 112/63  Pulse:    Resp:  18  Temp:  98 F (36.7 C)  SpO2:  96%   Vitals:   08/25/17 2240 08/26/17 0148 08/26/17 0208 08/26/17 0512  BP: (!) 180/62  (!) 108/56 112/63  Pulse: 67     Resp: 19   18  Temp: 98.1 F (36.7 C)   98 F (36.7 C)  TempSrc: Oral   Oral  SpO2: 95% 95%  96%  Weight:      Height:        General: Pt is alert, awake, not in acute distress Cardiovascular: RRR, S1/S2 +, no rubs, no gallops Respiratory: CTA bilaterally, no wheezing, no rhonchi Abdominal: Soft, NT, ND, bowel sounds + Extremities: no edema, no cyanosis  The results of significant diagnostics from this hospitalization (including imaging, microbiology, ancillary and laboratory) are listed below for reference.     Microbiology: Recent Results (from the past 240 hour(s))  Culture, blood (routine x 2)     Status: None   Collection Time: 08/23/17  2:46 PM  Result Value Ref Range Status   Specimen Description BLOOD LEFT HAND  Final   Special Requests   Final    BOTTLES DRAWN AEROBIC AND ANAEROBIC Blood Culture adequate volume   Culture   Final    NO GROWTH 5 DAYS Performed at [facility], [address], [place], [place] [address]    Report Status 08/29/2017 FINAL  Final  Culture, blood (routine x 2)     Status: None   Collection Time: 08/23/17  4:05 PM  Result Value Ref Range Status   Specimen Description BLOOD RIGHT ANTECUBITAL  Final   Special Requests   Final    BOTTLES DRAWN AEROBIC AND ANAEROBIC Blood Culture adequate volume   Culture   Final    NO GROWTH 5 DAYS Performed at [facility], [place] [address], [place], [place] [address]    Report Status 08/29/2017 FINAL  Final  Culture, expectorated sputum-assessment     Status: None   Collection Time: 08/25/17 11:39 AM  Result Value  Ref Range Status   Specimen Description EXPECTORATED SPUTUM  Final   Special Requests NONE  Final   Sputum evaluation THIS SPECIMEN IS ACCEPTABLE FOR SPUTUM CULTURE  Final   Report Status 08/25/2017 FINAL  Final  Culture, respiratory (NON-Expectorated)     Status: None   Collection Time: 08/25/17 11:39 AM  Result Value Ref Range Status   Specimen Description EXPECTORATED SPUTUM  Final   Special Requests NONE Reflexed from [id number]  Final   Gram Stain   Final    RARE WBC PRESENT, PREDOMINANTLY PMN RARE GRAM POSITIVE COCCI    Culture   Final    Consistent with normal respiratory flora. Performed at [facility], [place] [address], [place], [place] [address]    Report Status 08/27/2017 FINAL  Final     Labs: BNP (last 3 results) Recent Labs    08/23/17 1440  BNP 20.2   Basic Metabolic Panel: Recent Labs  Lab 08/26/17 0511  NA 140  K 4.1  CL 101  CO2 30  GLUCOSE 128*  BUN 14  CREATININE 0.67  CALCIUM 9.2   Liver Function Tests: No results for input(s): AST, ALT, ALKPHOS, BILITOT, PROT, ALBUMIN in the last 168 hours. No results for input(s): LIPASE, AMYLASE in the last 168 hours. No results for input(s): AMMONIA in the last 168 hours. CBC: Recent Labs  Lab 08/26/17 0511  WBC 9.0  HGB 12.2  HCT 37.2  MCV 91.6  PLT 171   Cardiac Enzymes: No results for input(s): CKTOTAL, CKMB, CKMBINDEX, TROPONINI in the last 168 hours. BNP: Invalid input(s): POCBNP CBG: No results for input(s): GLUCAP in the last 168 hours. D-Dimer No results for input(s): DDIMER in the last 72 hours. Hgb A1c No results for input(s): HGBA1C in the last 72 hours. Lipid Profile No results for input(s): CHOL, HDL, LDLCALC, TRIG, CHOLHDL, LDLDIRECT in the last 72 hours. Thyroid function studies No results for input(s): TSH, T4TOTAL, T3FREE, THYROIDAB in the last 72 hours.  Invalid input(s): FREET3 Anemia work up No results for input(s): VITAMINB12, FOLATE, FERRITIN, TIBC, IRON,  RETICCTPCT in the last 72 hours. Urinalysis    Component Value  Date/Time   COLORURINE YELLOW 07/25/2017 [place] 07/25/2017 1645   LABSPEC 1.009 07/25/2017 1645   PHURINE 6.0 07/25/2017 1645   GLUCOSEU NEGATIVE 07/25/2017 1645   HGBUR SMALL (A) 07/25/2017 1645   BILIRUBINUR NEGATIVE 07/25/2017 1645   KETONESUR NEGATIVE 07/25/2017 1645   PROTEINUR NEGATIVE 07/25/2017 1645   NITRITE NEGATIVE 07/25/2017 1645   LEUKOCYTESUR SMALL (A) 07/25/2017 1645   Sepsis Labs Invalid input(s): PROCALCITONIN,  WBC,  LACTICIDVEN Microbiology Recent Results (from the past 240 hour(s))  Culture, blood (routine x 2)     Status: None   Collection Time: 08/23/17  2:46 PM  Result Value Ref Range Status   Specimen Description BLOOD LEFT HAND  Final   Special Requests   Final    BOTTLES DRAWN AEROBIC AND ANAEROBIC Blood Culture adequate volume   Culture   Final    NO GROWTH 5 DAYS Performed at [facility], [place] [address], [place], [place] [address]    Report Status 08/29/2017 FINAL  Final  Culture, blood (routine x 2)     Status: None   Collection Time: 08/23/17  4:05 PM  Result Value Ref Range Status   Specimen Description BLOOD RIGHT ANTECUBITAL  Final   Special Requests   Final    BOTTLES DRAWN AEROBIC AND ANAEROBIC Blood Culture adequate volume   Culture   Final    NO GROWTH 5 DAYS Performed at [facility], [place] [address] [place], [place] [address]    Report Status 08/29/2017 FINAL  Final  Culture, expectorated sputum-assessment     Status: None   Collection Time: 08/25/17 11:39 AM  Result Value Ref Range Status   Specimen Description EXPECTORATED SPUTUM  Final   Special Requests NONE  Final   Sputum evaluation THIS SPECIMEN IS ACCEPTABLE FOR SPUTUM CULTURE  Final   Report Status 08/25/2017 FINAL  Final  Culture, respiratory (NON-Expectorated)     Status: None   Collection Time: 08/25/17 11:39 AM  Result Value Ref Range Status   Specimen Description  EXPECTORATED SPUTUM  Final   Special Requests NONE Reflexed from [id number]  Final   Gram Stain   Final    RARE WBC PRESENT, PREDOMINANTLY PMN RARE GRAM POSITIVE COCCI    Culture   Final    Consistent with normal respiratory flora. Performed at [facility], [place] [address], [place], [place] [address]    Report Status 08/27/2017 FINAL  Final     Time coordinating discharge: Over 30 minutes  SIGNED:   Hosie Poisson, MD  [facility] 09/01/2017, 1:36 PM Pager   If 7PM-7AM, please contact night-coverage [email or web address] Password [email or web address]

## 2018-01-27 ENCOUNTER — Emergency Department (HOSPITAL_COMMUNITY): Payer: Medicare Other

## 2018-01-27 ENCOUNTER — Other Ambulatory Visit: Payer: Self-pay

## 2018-01-27 ENCOUNTER — Emergency Department (HOSPITAL_COMMUNITY)
Admission: EM | Admit: 2018-01-27 | Discharge: 2018-01-27 | Disposition: A | Discharge status: Home / Self Care | Payer: Medicare Other | Attending: Emergency Medicine | Admitting: Emergency Medicine

## 2018-01-27 DIAGNOSIS — Y9301 Activity, walking, marching and hiking: Secondary | ICD-10-CM | POA: Insufficient documentation

## 2018-01-27 DIAGNOSIS — M545 Low back pain: Secondary | ICD-10-CM | POA: Diagnosis not present

## 2018-01-27 DIAGNOSIS — Y999 Unspecified external cause status: Secondary | ICD-10-CM | POA: Insufficient documentation

## 2018-01-27 DIAGNOSIS — W19XXXA Unspecified fall, initial encounter: Secondary | ICD-10-CM

## 2018-01-27 DIAGNOSIS — W109XXA Fall (on) (from) unspecified stairs and steps, initial encounter: Secondary | ICD-10-CM | POA: Diagnosis not present

## 2018-01-27 DIAGNOSIS — J449 Chronic obstructive pulmonary disease, unspecified: Secondary | ICD-10-CM | POA: Insufficient documentation

## 2018-01-27 DIAGNOSIS — M25562 Pain in left knee: Secondary | ICD-10-CM

## 2018-01-27 DIAGNOSIS — Z96649 Presence of unspecified artificial hip joint: Secondary | ICD-10-CM | POA: Diagnosis not present

## 2018-01-27 DIAGNOSIS — F17200 Nicotine dependence, unspecified, uncomplicated: Secondary | ICD-10-CM | POA: Insufficient documentation

## 2018-01-27 DIAGNOSIS — Z79899 Other long term (current) drug therapy: Secondary | ICD-10-CM | POA: Insufficient documentation

## 2018-01-27 MED ORDER — OXYCODONE-ACETAMINOPHEN 5-325 MG PO TABS
2.0000 | ORAL_TABLET | Freq: Once | ORAL | Status: AC
  Administered 2018-01-27: 2 via ORAL
  Filled 2018-01-27: qty 2

## 2018-01-27 NOTE — ED Provider Notes (Signed)
[facility] DEPT Provider Note   CSN: [id number] Arrival date & time: 01/27/18  1415     History   Chief Complaint Chief Complaint  Patient presents with  . Fall    HPI Stacey Herman is a 62 y.o. female.  HPI  Stacey Herman is a 62 y.o. female, with a history of chronic back pain and COPD, presenting to the ED with left knee and left big toe pain following a fall.  Fell down about 4 steps around 3pm yesterday. Left knee "gave out." Landed with left leg under her.  Takes percocet from pain clinic, but this has not improved pain.  Pain to the left knee is mostly posterior, throbbing, 10/10, nonradiating. Also complains of pain to the left posterior buttocks. Denies head injury, LOC, neck/back pain, numbness, weakness, nausea/vomiting, headache, chest pain, shortness of breath, abdominal pain, or any other complaints.     Past Medical History:  Diagnosis Date  . Acid reflux   . COPD (chronic obstructive pulmonary disease) ([place])   . Kidney stones   . Medical history non-contributory   . Renal disorder     Patient Active Problem List   Diagnosis Date Noted  . SIRS (systemic inflammatory response syndrome) ([place]) 08/24/2017  . Elevated lactic acid level 08/24/2017  . Hypokalemia 08/24/2017  . Hyperglycemia 08/24/2017  . COPD exacerbation ([place]) 08/23/2017  . Chest pain 07/20/2017    Past Surgical History:  Procedure Laterality Date  . APPENDECTOMY    . BACK SURGERY    . CERVICAL FUSION    . TOTAL HIP ARTHROPLASTY       OB History   None      Home Medications    Prior to Admission medications   Medication Sig Start Date End Date Taking? Authorizing Provider  albuterol (PROVENTIL) (2.5 MG/3ML) 0.083% nebulizer solution Take 3 mLs (2.5 mg total) by nebulization every 6 (six) hours as needed for wheezing. 08/26/17  Yes Hosie Poisson, MD  esomeprazole (NEXIUM) 40 MG capsule Take 40 mg by mouth daily.    Yes [provider]  ipratropium-albuterol (DUONEB) 0.5-2.5 (3) MG/3ML SOLN Take 3 mLs by nebulization every 6 (six) hours as needed. 08/26/17  Yes Hosie Poisson, MD  Multiple Vitamin (MULTIVITAMIN WITH MINERALS) TABS tablet Take 1 tablet by mouth daily.   Yes [provider]  oxyCODONE-acetaminophen (PERCOCET) 10-325 MG tablet Take 1 tablet by mouth every 6 (six) hours as needed for pain. 01/30/17  Yes Orlie Dakin, MD  nicotine (NICODERM CQ - DOSED IN MG/24 HOURS) 21 mg/24hr patch Place 1 patch (21 mg total) onto the skin daily. Patient not taking: Reported on 01/27/2018 08/27/17   Hosie Poisson, MD  predniSONE (STERAPRED UNI-PAK 21 TAB) 10 MG (21) TBPK tablet Prednisone 40 mg daily for 3 days followed by  Prednisone 30 mg daily for 3 days followed by  Prednisone 20 mg daily for 3 days. Patient not taking: Reported on 01/27/2018 08/26/17   Hosie Poisson, MD  traZODone (DESYREL) 50 MG tablet Take 1 tablet (50 mg total) by mouth at bedtime as needed for sleep. Patient not taking: Reported on 01/27/2018 07/21/17   Geradine Girt, DO    Family History Family History  Problem Relation Age of Onset  . Cancer Mother   . Heart attack Father   . Heart attack Paternal Grandmother     Social History Social History   Tobacco Use  . Smoking status: Current Every Day Smoker    Packs/day: 0.25  .  Smokeless tobacco: Never Used  Substance Use Topics  . Alcohol use: No  . Drug use: No     Allergies   Aspirin; Morphine and related; Penicillins; Benadryl [diphenhydramine]; Ciprocin-fluocin-procin [fluocinolone]; and Sulfa antibiotics   Review of Systems Review of Systems  Respiratory: Negative for shortness of breath.   Cardiovascular: Negative for chest pain.  Gastrointestinal: Negative for abdominal pain, nausea and vomiting.  Musculoskeletal: Positive for arthralgias. Negative for back pain and neck pain.  Neurological: Negative for weakness, numbness and headaches.  All other systems  reviewed and are negative.    Physical Exam Updated Vital Signs BP 90/69 (BP Location: Left Arm)   Pulse 100   Temp 98.6 F (37 C) (Oral)   Resp 18   SpO2 97%   Physical Exam  Constitutional: She appears well-developed and well-nourished. No distress.  HENT:  Head: Normocephalic and atraumatic.  Eyes: Pupils are equal, round, and reactive to light. Conjunctivae and EOM are normal.  Neck: Neck supple.  Cardiovascular: Normal rate, regular rhythm, normal heart sounds and intact distal pulses.  Pulmonary/Chest: Effort normal and breath sounds normal. No respiratory distress.  Abdominal: Soft. There is no tenderness. There is no guarding.  Musculoskeletal: She exhibits no edema.  Normal motor function intact in all extremities and spine. No midline spinal tenderness.  Tenderness to the left posterior buttocks.  Tenderness across the entire knee.  Patella appears to be in correct anatomical position.  Patient noted to freely move her left hip and knee.  She does, however, have pain with attempting to put weight on the left leg.  No noted swelling, erythema, bruising, instability, deformity, or laxity to these areas.  Tenderness and slight bruising to the dorsal left big toe.  No deformity, swelling, or other abnormality noted.  Full range of motion through the cardinal directions of the left hip, knee, ankle, and toes.  Lymphadenopathy:    She has no cervical adenopathy.  Neurological: She is alert.  No sensory deficits.  No noted speech deficits. No aphasia. Patient handles oral secretions without difficulty. No noted swallowing defects.  Equal grip strength bilaterally. Strength 5/5 in the upper extremities. Strength 5/5 with flexion and extension of the hips, knees, and ankles bilaterally.  Cranial nerves III-XII grossly intact.  No facial droop.   Skin: Skin is warm and dry. Capillary refill takes less than 2 seconds. She is not diaphoretic.  Psychiatric: She has a normal  mood and affect. Her behavior is normal.  Nursing note and vitals reviewed.    ED Treatments / Results  Labs (all labs ordered are listed, but only abnormal results are displayed) Labs Reviewed - No data to display  EKG None  Radiology Dg Lumbar Spine Complete  Result Date: 01/27/2018 CLINICAL DATA:  Fall down 4 steps, low back pain and hip pain. EXAM: LUMBAR SPINE - COMPLETE 4+ VIEW COMPARISON:  06/02/2017 FINDINGS: Bilateral nephrolithiasis. Left hip prosthesis. Aortoiliac atherosclerotic vascular disease. No lumbar compression fractures identified. Multilevel degenerative disc disease with vacuum disc phenomenon at L2-3, L4-5, and L5-S1. Loss of disc height at L4-5 at L5-S1 with posterior osseous ridging suggested at L5-S1. Facet arthropathy bilaterally at L4-5 and L5-S1. No subluxation observed. IMPRESSION: 1. Chronically stable lumbar spondylosis and degenerative disc disease. No fracture or subluxation identified. 2. Bilateral nephrolithiasis. 3.  Aortic Atherosclerosis (ICD10-I70.0). Electronically Signed   By: Van Clines M.D.   On: 01/27/2018 18:23   Dg Knee Complete 4 Views Left  Result Date: 01/27/2018 CLINICAL DATA:  62  y/o F; fall today with pain of the entire left knee joint. EXAM: LEFT KNEE - COMPLETE 4+ VIEW COMPARISON:  None. FINDINGS: No evidence of fracture, dislocation, or joint effusion. No evidence of arthropathy or other focal bone abnormality. Soft tissues are unremarkable. IMPRESSION: Negative. Electronically Signed   By: Kristine Garbe M.D.   On: 01/27/2018 15:03   Dg Foot Complete Left  Result Date: 01/27/2018 CLINICAL DATA:  62 y/o F; fall today. Pain of the distal metatarsals. EXAM: LEFT FOOT - COMPLETE 3+ VIEW COMPARISON:  None. FINDINGS: There is no evidence of fracture or dislocation. Small plantar calcaneal enthesophyte. Osteophytes of talar neck may represent anterior ankle impingement in the appropriate clinical setting. Lisfranc alignment is  maintained. IMPRESSION: No acute fracture or dislocation identified. Electronically Signed   By: Kristine Garbe M.D.   On: 01/27/2018 15:05   Dg Hip Unilat W Or Wo Pelvis 2-3 Views Left  Result Date: 01/27/2018 CLINICAL DATA:  Left hip pain after fall. EXAM: DG HIP (WITH OR WITHOUT PELVIS) 2-3V LEFT COMPARISON:  Left hip x-rays dated January 30, 2017. FINDINGS: Prior left total hip arthroplasty. No evidence of hardware failure or loosening. No acute fracture or dislocation. Degenerative changes of the right hip, similar to prior study. The pubic symphysis and sacroiliac joints are intact. Osteopenia. Soft tissues are unremarkable. IMPRESSION: 1. Prior left total hip arthroplasty.  No acute abnormality. Electronically Signed   By: Titus Dubin M.D.   On: 01/27/2018 18:21    Procedures Procedures (including critical care time)  Medications Ordered in ED Medications  oxyCODONE-acetaminophen (PERCOCET/ROXICET) 5-325 MG per tablet 2 tablet (2 tablets Oral Given 01/27/18 1833)     Initial Impression / Assessment and Plan / ED Course  I have reviewed the triage vital signs and the nursing notes.  Pertinent labs & imaging results that were available during my care of the patient were reviewed by me and considered in my medical decision making (see chart for details).     Patient presents with left hip, knee, and big toe pain.  Freely moves each of her painful joints during the exam without red flag symptoms.  Neurovascularly intact on exam.  No acute abnormalities on plain films.  Orthopedic follow-up. The patient was given instructions for home care as well as return precautions. Patient voices understanding of these instructions, accepts the plan, and is comfortable with discharge.  Findings and plan of care discussed with Lacretia Leigh, MD.   Vitals:   01/27/18 1435 01/27/18 1928  BP: 90/69 116/86  Pulse: 100 82  Resp: 18 15  Temp: 98.6 F (37 C)   TempSrc: Oral   SpO2: 97% 96%        Final Clinical Impressions(s) / ED Diagnoses   Final diagnoses:  Fall, initial encounter  Acute pain of left knee    ED Discharge Orders    None       Layla Maw 01/27/18 2340    Lacretia Leigh, MD 01/28/18 [phone]

## 2018-01-27 NOTE — ED Notes (Signed)
Pt angry with staff stating "Well am I not going to be seen? I'm in pain."  Rn attempted to explain to patient that we triage based on patient complaint and that she would be seen as soon as we had a room.   Pt angry and yelled at nurse "Well I came in by EMS."  RN again explained to patient that, no matter method of transport here, we go by our acuity level. Pt has already had x-rays and will be seen as soon as a room is available.

## 2018-01-27 NOTE — ED Notes (Signed)
ED Provider at bedside. 

## 2018-01-27 NOTE — ED Triage Notes (Signed)
Per Ems: Pt is coming from home. Pt had a fall yesterday. Pt fell down a flight of stairs. Bruising to left big toe and left side of body. Pt is tender. Pt is able to bare minimal weight. Pt denies LOC. Pt has chronic neck and back pain.

## 2018-01-27 NOTE — Discharge Instructions (Addendum)
You have been seen today for a knee injury. There were no acute abnormalities on the x-rays, including no sign of fracture or dislocation, however, there could be injuries to the soft tissues, such as the ligaments or tendons that are not seen on xrays. There could also be what are called occult fractures that are small fractures not seen on xray. Pain: Tylenol and/or your home pain regimen. Ice: May apply ice to the area over the next 24 hours for 15 minutes at a time to reduce swelling. Elevation: Keep the extremity elevated as often as possible to reduce pain and inflammation. Support: Wear the knee immobilizer for support and comfort. Wear this until pain resolves. You will be weight-bearing as tolerated, which means you can slowly start to put weight on the extremity and increase amount and frequency as pain allows. Exercises: Start by performing these exercises a few times a week, increasing the frequency until you are performing them twice daily.  Follow up: If symptoms are improving, you may follow up with your primary care provider for any continued management. If symptoms are not improving, you may follow up with the orthopedic specialist.

## 2018-07-25 ENCOUNTER — Inpatient Hospital Stay (HOSPITAL_COMMUNITY)
Admission: EM | Admit: 2018-07-25 | Discharge: 2018-07-27 | DRG: 661 | Disposition: A | Discharge status: Home / Self Care | Payer: Medicare Other | Attending: Internal Medicine | Admitting: Internal Medicine

## 2018-07-25 ENCOUNTER — Encounter (HOSPITAL_COMMUNITY): Payer: Self-pay | Admitting: Emergency Medicine

## 2018-07-25 ENCOUNTER — Other Ambulatory Visit: Payer: Self-pay

## 2018-07-25 ENCOUNTER — Emergency Department (HOSPITAL_COMMUNITY): Payer: Medicare Other

## 2018-07-25 DIAGNOSIS — F17210 Nicotine dependence, cigarettes, uncomplicated: Secondary | ICD-10-CM | POA: Diagnosis present

## 2018-07-25 DIAGNOSIS — Z87442 Personal history of urinary calculi: Secondary | ICD-10-CM

## 2018-07-25 DIAGNOSIS — Z882 Allergy status to sulfonamides status: Secondary | ICD-10-CM

## 2018-07-25 DIAGNOSIS — J449 Chronic obstructive pulmonary disease, unspecified: Secondary | ICD-10-CM | POA: Diagnosis not present

## 2018-07-25 DIAGNOSIS — K219 Gastro-esophageal reflux disease without esophagitis: Secondary | ICD-10-CM | POA: Diagnosis not present

## 2018-07-25 DIAGNOSIS — N1339 Other hydronephrosis: Secondary | ICD-10-CM

## 2018-07-25 DIAGNOSIS — Z880 Allergy status to penicillin: Secondary | ICD-10-CM

## 2018-07-25 DIAGNOSIS — Z91018 Allergy to other foods: Secondary | ICD-10-CM

## 2018-07-25 DIAGNOSIS — N132 Hydronephrosis with renal and ureteral calculous obstruction: Principal | ICD-10-CM | POA: Diagnosis present

## 2018-07-25 DIAGNOSIS — R531 Weakness: Secondary | ICD-10-CM | POA: Diagnosis present

## 2018-07-25 DIAGNOSIS — N201 Calculus of ureter: Secondary | ICD-10-CM

## 2018-07-25 DIAGNOSIS — Z96642 Presence of left artificial hip joint: Secondary | ICD-10-CM | POA: Diagnosis present

## 2018-07-25 DIAGNOSIS — Z79899 Other long term (current) drug therapy: Secondary | ICD-10-CM | POA: Diagnosis not present

## 2018-07-25 DIAGNOSIS — Z881 Allergy status to other antibiotic agents status: Secondary | ICD-10-CM | POA: Diagnosis not present

## 2018-07-25 DIAGNOSIS — R109 Unspecified abdominal pain: Secondary | ICD-10-CM | POA: Diagnosis present

## 2018-07-25 DIAGNOSIS — Z9049 Acquired absence of other specified parts of digestive tract: Secondary | ICD-10-CM

## 2018-07-25 LAB — URINALYSIS, ROUTINE W REFLEX MICROSCOPIC
Bilirubin Urine: NEGATIVE
Glucose, UA: NEGATIVE mg/dL
Ketones, ur: NEGATIVE mg/dL
Nitrite: NEGATIVE
Protein, ur: NEGATIVE mg/dL
Specific Gravity, Urine: 1.009 (ref 1.005–1.030)
pH: 6 (ref 5.0–8.0)

## 2018-07-25 LAB — CBC
HCT: 43.3 % (ref 36.0–46.0)
Hemoglobin: 14.4 g/dL (ref 12.0–15.0)
MCH: 32.5 pg (ref 26.0–34.0)
MCHC: 33.3 g/dL (ref 30.0–36.0)
MCV: 97.7 fL (ref 78.0–100.0)
Platelets: 187 10*3/uL (ref 150–400)
RBC: 4.43 MIL/uL (ref 3.87–5.11)
RDW: 12.4 % (ref 11.5–15.5)
WBC: 6.2 10*3/uL (ref 4.0–10.5)

## 2018-07-25 LAB — COMPREHENSIVE METABOLIC PANEL
ALT: 12 U/L (ref 0–44)
AST: 14 U/L — ABNORMAL LOW (ref 15–41)
Albumin: 3.7 g/dL (ref 3.5–5.0)
Alkaline Phosphatase: 81 U/L (ref 38–126)
Anion gap: 5 (ref 5–15)
BUN: 7 mg/dL (ref 6–20)
CO2: 30 mmol/L (ref 22–32)
Calcium: 8.9 mg/dL (ref 8.9–10.3)
Chloride: 100 mmol/L (ref 98–111)
Creatinine, Ser: 0.75 mg/dL (ref 0.44–1.00)
GFR calc Af Amer: >60 mL/min (ref >60)
GFR calc non Af Amer: >60 mL/min (ref >60)
Glucose, Bld: 87 mg/dL (ref 70–99)
Potassium: 3.8 mmol/L (ref 3.5–5.1)
Sodium: 135 mmol/L (ref 135–145)
Total Bilirubin: 0.7 mg/dL (ref 0.3–1.2)
Total Protein: 6 g/dL — ABNORMAL LOW (ref 6.5–8.1)

## 2018-07-25 LAB — LIPASE, BLOOD: Lipase: 28 U/L (ref 11–51)

## 2018-07-25 LAB — I-STAT BETA HCG BLOOD, ED (MC, WL, AP ONLY): I-stat hCG, quantitative: 12.3 m[IU]/mL — ABNORMAL HIGH (ref <5)

## 2018-07-25 MED ORDER — ACETAMINOPHEN 650 MG RE SUPP: 650.0000 mg | Freq: Four times a day (QID) | RECTAL | Status: DC | PRN

## 2018-07-25 MED ORDER — KETOROLAC TROMETHAMINE 15 MG/ML IJ SOLN
15.0000 mg | Freq: Once | INTRAMUSCULAR | Status: AC
Start: 2018-07-25 — End: 2018-07-25
  Administered 2018-07-25: 15 mg via INTRAVENOUS
  Filled 2018-07-25: qty 1

## 2018-07-25 MED ORDER — ADULT MULTIVITAMIN W/MINERALS CH
1.0000 | ORAL_TABLET | Freq: Every day | ORAL | Status: DC
  Administered 2018-07-27: 1 via ORAL
  Filled 2018-07-25: qty 1

## 2018-07-25 MED ORDER — SODIUM CHLORIDE 0.9 % IV SOLN
1.0000 g | Freq: Three times a day (TID) | INTRAVENOUS | Status: DC
  Administered 2018-07-26 – 2018-07-27 (×5): 1 g via INTRAVENOUS
  Filled 2018-07-25 (×7): qty 1

## 2018-07-25 MED ORDER — ONDANSETRON HCL 4 MG/2ML IJ SOLN: 4.0000 mg | Freq: Four times a day (QID) | INTRAMUSCULAR | Status: DC | PRN

## 2018-07-25 MED ORDER — SENNOSIDES-DOCUSATE SODIUM 8.6-50 MG PO TABS: 1.0000 | ORAL_TABLET | Freq: Every evening | ORAL | Status: DC | PRN

## 2018-07-25 MED ORDER — SODIUM CHLORIDE 0.9 % IV SOLN
INTRAVENOUS | Status: DC
  Administered 2018-07-25 (×2): via INTRAVENOUS

## 2018-07-25 MED ORDER — ACETAMINOPHEN 325 MG PO TABS
650.0000 mg | ORAL_TABLET | Freq: Four times a day (QID) | ORAL | Status: DC | PRN
  Administered 2018-07-26: 650 mg via ORAL
  Filled 2018-07-25 (×2): qty 2

## 2018-07-25 MED ORDER — HYDROMORPHONE HCL 1 MG/ML IJ SOLN
0.5000 mg | Freq: Once | INTRAMUSCULAR | Status: AC
  Administered 2018-07-25: 0.5 mg via INTRAVENOUS
  Filled 2018-07-25: qty 1

## 2018-07-25 MED ORDER — ONDANSETRON HCL 4 MG/2ML IJ SOLN
4.0000 mg | Freq: Once | INTRAMUSCULAR | Status: AC
  Administered 2018-07-25: 4 mg via INTRAVENOUS
  Filled 2018-07-25: qty 2

## 2018-07-25 MED ORDER — IOHEXOL 300 MG/ML  SOLN
100.0000 mL | Freq: Once | INTRAMUSCULAR | Status: AC | PRN
  Administered 2018-07-25: 100 mL via INTRAVENOUS

## 2018-07-25 MED ORDER — ALBUTEROL SULFATE (2.5 MG/3ML) 0.083% IN NEBU: 2.5000 mg | INHALATION_SOLUTION | Freq: Four times a day (QID) | RESPIRATORY_TRACT | Status: DC | PRN

## 2018-07-25 MED ORDER — SODIUM CHLORIDE 0.9 % IV SOLN
INTRAVENOUS | Status: AC
  Administered 2018-07-26: 06:00:00 via INTRAVENOUS

## 2018-07-25 MED ORDER — OXYCODONE-ACETAMINOPHEN 5-325 MG PO TABS
1.0000 | ORAL_TABLET | Freq: Once | ORAL | Status: AC
  Administered 2018-07-25: 1 via ORAL
  Filled 2018-07-25: qty 1

## 2018-07-25 MED ORDER — CIPROFLOXACIN IN D5W 400 MG/200ML IV SOLN: 400.0000 mg | Freq: Two times a day (BID) | INTRAVENOUS | Status: DC

## 2018-07-25 MED ORDER — CYCLOBENZAPRINE HCL 5 MG PO TABS
5.0000 mg | ORAL_TABLET | Freq: Three times a day (TID) | ORAL | Status: DC | PRN
  Filled 2018-07-25: qty 1

## 2018-07-25 MED ORDER — HYDROMORPHONE HCL 1 MG/ML IJ SOLN
1.0000 mg | Freq: Once | INTRAMUSCULAR | Status: AC
  Administered 2018-07-25: 1 mg via INTRAVENOUS
  Filled 2018-07-25: qty 1

## 2018-07-25 MED ORDER — HYDROMORPHONE HCL 1 MG/ML IJ SOLN
0.5000 mg | INTRAMUSCULAR | Status: DC | PRN
  Administered 2018-07-25 – 2018-07-27 (×7): 1 mg via INTRAVENOUS
  Filled 2018-07-25 (×7): qty 1

## 2018-07-25 NOTE — Consult Note (Signed)
H&P Physician requesting consult: Mitzi Hansen, MD  Chief Complaint: Multiple left ureteral calculi, left flank pain, hydronephrosis  History of Present Illness: 62 year old female with history of COPD and nephrolithiasis presents with a weeklong history of left-sided flank pain.  Her pain is unable to be controlled.  She has multiple allergies to antibiotics.  White blood cell count and creatinine are normal.  Urinalysis does show large leukocyte, rare bacteria, mucus, 21-50 WBCs.  She is afebrile with vital signs stable but still has persistent left-sided pain and desires some relief.  Pain medication has not relieved her pain enough.  Past Medical History:  Diagnosis Date  . Acid reflux   . COPD (chronic obstructive pulmonary disease) ([place])   . Kidney stones   . Medical history non-contributory   . Renal disorder    Past Surgical History:  Procedure Laterality Date  . APPENDECTOMY    . BACK SURGERY    . CERVICAL FUSION    . TOTAL HIP ARTHROPLASTY      Home Medications:   (Not in a hospital admission) Allergies:  Allergies  Allergen Reactions  . Amoxicillin Anaphylaxis and Hives  . Aspirin Anaphylaxis and Hives  . Benadryl [Diphenhydramine] Anaphylaxis and Hives  . Ciprocin-Fluocin-Procin [Fluocinolone] Anaphylaxis and Hives  . Morphine And Related Anaphylaxis and Hives  . Mushroom Extract Complex Anaphylaxis and Hives  . Penicillins Anaphylaxis and Hives    Unknown Has patient had a PCN reaction causing immediate rash, facial/tongue/throat swelling, SOB or lightheadedness with hypotension: Yes Has patient had a PCN reaction causing severe rash involving mucus membranes or skin necrosis: Yes Has patient had a PCN reaction that required hospitalization: Yes Has patient had a PCN reaction occurring within the last 10 years: Yes If all of the above answers are "NO", then may proceed with Cephalosporin use.   . Sulfa Antibiotics Anaphylaxis and Hives    Family History   Problem Relation Age of Onset  . Cancer Mother   . Heart attack Father   . Heart attack Paternal Grandmother    Social History:  reports that she has been smoking. She has been smoking about 0.25 packs per day. She has never used smokeless tobacco. She reports that she does not drink alcohol or use drugs.  ROS: A complete review of systems was performed.  All systems are negative except for pertinent findings as noted. ROS   Physical Exam:  Vital signs in last 24 hours: Temp:  [98.1 F (36.7 C)] 98.1 F (36.7 C) (09/28 1419) Pulse Rate:  [63-98] 63 (09/28 2109) Resp:  [18-20] 18 (09/28 2109) BP: (101-114)/(60-99) 111/60 (09/28 2109) SpO2:  [93 %-97 %] 93 % (09/28 2109) Weight:  [82.1 kg] 82.1 kg (09/28 1419) General:  Alert and oriented, No acute distress HEENT: Normocephalic, atraumatic Neck: No JVD or lymphadenopathy Cardiovascular: Regular rate and rhythm Lungs: Regular rate and effort Abdomen: Soft, nontender, nondistended, no abdominal masses Back: No CVA tenderness Extremities: No edema Neurologic: Grossly intact  Laboratory Data:  Results for orders placed or performed during the hospital encounter of 07/25/18 (from the past 24 hour(s))  Urinalysis, Routine w reflex microscopic     Status: Abnormal   Collection Time: 07/25/18  2:18 PM  Result Value Ref Range   Color, Urine YELLOW YELLOW   APPearance HAZY (A) CLEAR   Specific Gravity, Urine 1.009 1.005 - 1.030   pH 6.0 5.0 - 8.0   Glucose, UA NEGATIVE NEGATIVE mg/dL   Hgb urine dipstick MODERATE (A) NEGATIVE  Bilirubin Urine NEGATIVE NEGATIVE   Ketones, ur NEGATIVE NEGATIVE mg/dL   Protein, ur NEGATIVE NEGATIVE mg/dL   Nitrite NEGATIVE NEGATIVE   Leukocytes, UA LARGE (A) NEGATIVE   RBC / HPF 6-10 0 - 5 RBC/hpf   WBC, UA 21-50 0 - 5 WBC/hpf   Bacteria, UA RARE (A) NONE SEEN   Squamous Epithelial / LPF 6-10 0 - 5   Mucus PRESENT   Lipase, blood     Status: None   Collection Time: 07/25/18  2:51 PM  Result  Value Ref Range   Lipase 28 11 - 51 U/L  Comprehensive metabolic panel     Status: Abnormal   Collection Time: 07/25/18  2:51 PM  Result Value Ref Range   Sodium 135 135 - 145 mmol/L   Potassium 3.8 3.5 - 5.1 mmol/L   Chloride 100 98 - 111 mmol/L   CO2 30 22 - 32 mmol/L   Glucose, Bld 87 70 - 99 mg/dL   BUN 7 6 - 20 mg/dL   Creatinine, Ser 0.75 0.44 - 1.00 mg/dL   Calcium 8.9 8.9 - 10.3 mg/dL   Total Protein 6.0 (L) 6.5 - 8.1 g/dL   Albumin 3.7 3.5 - 5.0 g/dL   AST 14 (L) 15 - 41 U/L   ALT 12 0 - 44 U/L   Alkaline Phosphatase 81 38 - 126 U/L   Total Bilirubin 0.7 0.3 - 1.2 mg/dL   GFR calc non Af Amer >60 >60 mL/min   GFR calc Af Amer >60 >60 mL/min   Anion gap 5 5 - 15  CBC     Status: None   Collection Time: 07/25/18  2:51 PM  Result Value Ref Range   WBC 6.2 4.0 - 10.5 K/uL   RBC 4.43 3.87 - 5.11 MIL/uL   Hemoglobin 14.4 12.0 - 15.0 g/dL   HCT 43.3 36.0 - 46.0 %   MCV 97.7 78.0 - 100.0 fL   MCH 32.5 26.0 - 34.0 pg   MCHC 33.3 30.0 - 36.0 g/dL   RDW 12.4 11.5 - 15.5 %   Platelets 187 150 - 400 K/uL  I-Stat beta hCG blood, ED     Status: Abnormal   Collection Time: 07/25/18  2:57 PM  Result Value Ref Range   I-stat hCG, quantitative 12.3 (H) <5 mIU/mL   Comment 3           No results found for this or any previous visit (from the past 240 hour(s)). Creatinine: Recent Labs    07/25/18 1451  CREATININE 0.75    Impression/Assessment:  Left and ureteral calculi Left flank pain secondary to renal colic Hydronephrosis secondary to ureteral calculus  Plan:  Plan to perform cystoscopy with left ureteral stent tomorrow tentatively.  Antibiotics per internal medicine given her multiple allergies.  Would recommend 7-day course of antibiotics given her urinalysis unless her urine culture comes back negative.  Marton Redwood, III 07/25/2018, 9:17 PM

## 2018-07-25 NOTE — H&P (View-Only) (Signed)
H&P Physician requesting consult: Mitzi Hansen, MD  Chief Complaint: Multiple left ureteral calculi, left flank pain, hydronephrosis  History of Present Illness: 62 year old female with history of COPD and nephrolithiasis presents with a weeklong history of left-sided flank pain.  Her pain is unable to be controlled.  She has multiple allergies to antibiotics.  White blood cell count and creatinine are normal.  Urinalysis does show large leukocyte, rare bacteria, mucus, 21-50 WBCs.  She is afebrile with vital signs stable but still has persistent left-sided pain and desires some relief.  Pain medication has not relieved her pain enough.  Past Medical History:  Diagnosis Date  . Acid reflux   . COPD (chronic obstructive pulmonary disease) ([place])   . Kidney stones   . Medical history non-contributory   . Renal disorder    Past Surgical History:  Procedure Laterality Date  . APPENDECTOMY    . BACK SURGERY    . CERVICAL FUSION    . TOTAL HIP ARTHROPLASTY      Home Medications:   (Not in a hospital admission) Allergies:  Allergies  Allergen Reactions  . Amoxicillin Anaphylaxis and Hives  . Aspirin Anaphylaxis and Hives  . Benadryl [Diphenhydramine] Anaphylaxis and Hives  . Ciprocin-Fluocin-Procin [Fluocinolone] Anaphylaxis and Hives  . Morphine And Related Anaphylaxis and Hives  . Mushroom Extract Complex Anaphylaxis and Hives  . Penicillins Anaphylaxis and Hives    Unknown Has patient had a PCN reaction causing immediate rash, facial/tongue/throat swelling, SOB or lightheadedness with hypotension: Yes Has patient had a PCN reaction causing severe rash involving mucus membranes or skin necrosis: Yes Has patient had a PCN reaction that required hospitalization: Yes Has patient had a PCN reaction occurring within the last 10 years: Yes If all of the above answers are "NO", then may proceed with Cephalosporin use.   . Sulfa Antibiotics Anaphylaxis and Hives    Family History   Problem Relation Age of Onset  . Cancer Mother   . Heart attack Father   . Heart attack Paternal Grandmother    Social History:  reports that she has been smoking. She has been smoking about 0.25 packs per day. She has never used smokeless tobacco. She reports that she does not drink alcohol or use drugs.  ROS: A complete review of systems was performed.  All systems are negative except for pertinent findings as noted. ROS   Physical Exam:  Vital signs in last 24 hours: Temp:  [98.1 F (36.7 C)] 98.1 F (36.7 C) (09/28 1419) Pulse Rate:  [63-98] 63 (09/28 2109) Resp:  [18-20] 18 (09/28 2109) BP: (101-114)/(60-99) 111/60 (09/28 2109) SpO2:  [93 %-97 %] 93 % (09/28 2109) Weight:  [82.1 kg] 82.1 kg (09/28 1419) General:  Alert and oriented, No acute distress HEENT: Normocephalic, atraumatic Neck: No JVD or lymphadenopathy Cardiovascular: Regular rate and rhythm Lungs: Regular rate and effort Abdomen: Soft, nontender, nondistended, no abdominal masses Back: No CVA tenderness Extremities: No edema Neurologic: Grossly intact  Laboratory Data:  Results for orders placed or performed during the hospital encounter of 07/25/18 (from the past 24 hour(s))  Urinalysis, Routine w reflex microscopic     Status: Abnormal   Collection Time: 07/25/18  2:18 PM  Result Value Ref Range   Color, Urine YELLOW YELLOW   APPearance HAZY (A) CLEAR   Specific Gravity, Urine 1.009 1.005 - 1.030   pH 6.0 5.0 - 8.0   Glucose, UA NEGATIVE NEGATIVE mg/dL   Hgb urine dipstick MODERATE (A) NEGATIVE  Bilirubin Urine NEGATIVE NEGATIVE   Ketones, ur NEGATIVE NEGATIVE mg/dL   Protein, ur NEGATIVE NEGATIVE mg/dL   Nitrite NEGATIVE NEGATIVE   Leukocytes, UA LARGE (A) NEGATIVE   RBC / HPF 6-10 0 - 5 RBC/hpf   WBC, UA 21-50 0 - 5 WBC/hpf   Bacteria, UA RARE (A) NONE SEEN   Squamous Epithelial / LPF 6-10 0 - 5   Mucus PRESENT   Lipase, blood     Status: None   Collection Time: 07/25/18  2:51 PM  Result  Value Ref Range   Lipase 28 11 - 51 U/L  Comprehensive metabolic panel     Status: Abnormal   Collection Time: 07/25/18  2:51 PM  Result Value Ref Range   Sodium 135 135 - 145 mmol/L   Potassium 3.8 3.5 - 5.1 mmol/L   Chloride 100 98 - 111 mmol/L   CO2 30 22 - 32 mmol/L   Glucose, Bld 87 70 - 99 mg/dL   BUN 7 6 - 20 mg/dL   Creatinine, Ser 0.75 0.44 - 1.00 mg/dL   Calcium 8.9 8.9 - 10.3 mg/dL   Total Protein 6.0 (L) 6.5 - 8.1 g/dL   Albumin 3.7 3.5 - 5.0 g/dL   AST 14 (L) 15 - 41 U/L   ALT 12 0 - 44 U/L   Alkaline Phosphatase 81 38 - 126 U/L   Total Bilirubin 0.7 0.3 - 1.2 mg/dL   GFR calc non Af Amer >60 >60 mL/min   GFR calc Af Amer >60 >60 mL/min   Anion gap 5 5 - 15  CBC     Status: None   Collection Time: 07/25/18  2:51 PM  Result Value Ref Range   WBC 6.2 4.0 - 10.5 K/uL   RBC 4.43 3.87 - 5.11 MIL/uL   Hemoglobin 14.4 12.0 - 15.0 g/dL   HCT 43.3 36.0 - 46.0 %   MCV 97.7 78.0 - 100.0 fL   MCH 32.5 26.0 - 34.0 pg   MCHC 33.3 30.0 - 36.0 g/dL   RDW 12.4 11.5 - 15.5 %   Platelets 187 150 - 400 K/uL  I-Stat beta hCG blood, ED     Status: Abnormal   Collection Time: 07/25/18  2:57 PM  Result Value Ref Range   I-stat hCG, quantitative 12.3 (H) <5 mIU/mL   Comment 3           No results found for this or any previous visit (from the past 240 hour(s)). Creatinine: Recent Labs    07/25/18 1451  CREATININE 0.75    Impression/Assessment:  Left and ureteral calculi Left flank pain secondary to renal colic Hydronephrosis secondary to ureteral calculus  Plan:  Plan to perform cystoscopy with left ureteral stent tomorrow tentatively.  Antibiotics per internal medicine given her multiple allergies.  Would recommend 7-day course of antibiotics given her urinalysis unless her urine culture comes back negative.  Marton Redwood, III 07/25/2018, 9:17 PM

## 2018-07-25 NOTE — ED Notes (Signed)
Pt states that she already provided urine sample when asked to provide sample

## 2018-07-25 NOTE — ED Notes (Signed)
Pt ambulatory to the restroom.  

## 2018-07-25 NOTE — ED Notes (Signed)
Patient transported to CT 

## 2018-07-25 NOTE — H&P (Signed)
History and Physical    Stacey Herman CLE:[id number] DOB: 19-Nov-1955 DOA: 07/25/2018  PCP: Rogers Blocker, MD   Patient coming from: Home   Chief Complaint: Left flank pain   HPI: Stacey Herman is a 62 y.o. female with medical history significant for COPD, now presenting to the emergency department for evaluation of severe left flank pain and nausea.  Patient reports that symptoms developed approximately 1 week ago, have been persistent, severe, sharp, aching, and with some radiation around into the left groin.  She denies fevers, chills, dysuria, or gross hematuria.  Denies any recent increase in her chronic cough or dyspnea, denies chest pain or palpitations, and denies any melena or hematochezia.  ED Course: Upon arrival to the ED, patient is found to be afebrile, saturating well on room air, and with vitals otherwise normal.  Chemistry panel and CBC are unremarkable and urinalysis is notable for moderate hemoglobin and large leukocytes.  CT abdomen and pelvis is notable for at least 4 stones in the left ureter, largest measuring 12 mm, with moderate left hydroureteronephrosis, as well as several nonobstructing stones bilaterally.  Urology was consulted by the ED physician and recommends a medical admission was a long hospital for ongoing evaluation and management.  Review of Systems:  All other systems reviewed and apart from HPI, are negative.  Past Medical History:  Diagnosis Date  . Acid reflux   . COPD (chronic obstructive pulmonary disease) ([place])   . Kidney stones   . Medical history non-contributory   . Renal disorder     Past Surgical History:  Procedure Laterality Date  . APPENDECTOMY    . BACK SURGERY    . CERVICAL FUSION    . TOTAL HIP ARTHROPLASTY       reports that she has been smoking. She has been smoking about 0.25 packs per day. She has never used smokeless tobacco. She reports that she does not drink alcohol or use drugs.  Allergies  Allergen Reactions  .  Amoxicillin Anaphylaxis and Hives  . Aspirin Anaphylaxis and Hives  . Benadryl [Diphenhydramine] Anaphylaxis and Hives  . Ciprocin-Fluocin-Procin [Fluocinolone] Anaphylaxis and Hives  . Morphine And Related Anaphylaxis and Hives  . Mushroom Extract Complex Anaphylaxis and Hives  . Penicillins Anaphylaxis and Hives    Unknown Has patient had a PCN reaction causing immediate rash, facial/tongue/throat swelling, SOB or lightheadedness with hypotension: Yes Has patient had a PCN reaction causing severe rash involving mucus membranes or skin necrosis: Yes Has patient had a PCN reaction that required hospitalization: Yes Has patient had a PCN reaction occurring within the last 10 years: Yes If all of the above answers are "NO", then may proceed with Cephalosporin use.   . Sulfa Antibiotics Anaphylaxis and Hives    Family History  Problem Relation Age of Onset  . Cancer Mother   . Heart attack Father   . Heart attack Paternal Grandmother      Prior to Admission medications   Medication Sig Start Date End Date Taking? Authorizing Provider  acetaminophen (TYLENOL) 325 MG tablet Take 325-650 mg by mouth every 6 (six) hours as needed (for headaches or pain).   Yes [provider]  albuterol (PROVENTIL) (2.5 MG/3ML) 0.083% nebulizer solution Take 3 mLs (2.5 mg total) by nebulization every 6 (six) hours as needed for wheezing. 08/26/17  Yes Hosie Poisson, MD  cyclobenzaprine (FLEXERIL) 5 MG tablet Take 5 mg by mouth 3 (three) times daily as needed for muscle spasms.   Yes [provider]  Multiple Vitamin (MULTIVITAMIN WITH MINERALS) TABS tablet Take 1 tablet by mouth daily.   Yes [provider]  oxyCODONE-acetaminophen (PERCOCET) 10-325 MG tablet Take 1 tablet by mouth every 6 (six) hours as needed for pain. Patient taking differently: Take 1 tablet by mouth 3 (three) times daily.  01/30/17  Yes Orlie Dakin, MD  ipratropium-albuterol (DUONEB) 0.5-2.5 (3) MG/3ML SOLN  Take 3 mLs by nebulization every 6 (six) hours as needed. Patient not taking: Reported on 07/25/2018 08/26/17   Hosie Poisson, MD  nicotine (NICODERM CQ - DOSED IN MG/24 HOURS) 21 mg/24hr patch Place 1 patch (21 mg total) onto the skin daily. Patient not taking: Reported on 07/25/2018 08/27/17   Hosie Poisson, MD  traZODone (DESYREL) 50 MG tablet Take 1 tablet (50 mg total) by mouth at bedtime as needed for sleep. Patient not taking: Reported on 07/25/2018 07/21/17   Geradine Girt, DO    Physical Exam: Vitals:   07/25/18 1828 07/25/18 1900 07/25/18 2109 07/25/18 2240  BP: 113/77 101/69 111/60 96/68  Pulse: 78 64 63 71  Resp: 18 18 18 16   Temp:    98.3 F (36.8 C)  TempSrc:    Oral  SpO2: 95% 93% 93% 96%  Weight:      Height:         Constitutional: NAD, in apparent discomfort  Eyes: PERTLA, lids and conjunctivae normal ENMT: Mucous membranes are moist. Posterior pharynx clear of any exudate or lesions.   Neck: normal, supple, no masses, no thyromegaly Respiratory: clear to auscultation bilaterally, no wheezing, no crackles. Normal respiratory effort.   Cardiovascular: S1 & S2 heard, regular rate and rhythm. No extremity edema.   Abdomen: No distension, left flank tenderness. Bowel sounds normal.  Musculoskeletal: no clubbing / cyanosis. No joint deformity upper and lower extremities.   Skin: no significant rashes, lesions, ulcers. Warm, dry, well-perfused. Neurologic: CN 2-12 grossly intact. Sensation intact. Strength 5/5 in all 4 limbs.  Psychiatric: Alert and oriented x 3. Calm, cooperative.    Labs on Admission: I have personally reviewed following labs and imaging studies  CBC: Recent Labs  Lab 07/25/18 1451  WBC 6.2  HGB 14.4  HCT 43.3  MCV 97.7  PLT 094   Basic Metabolic Panel: Recent Labs  Lab 07/25/18 1451  NA 135  K 3.8  CL 100  CO2 30  GLUCOSE 87  BUN 7  CREATININE 0.75  CALCIUM 8.9   GFR: Estimated Creatinine Clearance: 82.3 mL/min (by C-G  formula based on SCr of 0.75 mg/dL). Liver Function Tests: Recent Labs  Lab 07/25/18 1451  AST 14*  ALT 12  ALKPHOS 81  BILITOT 0.7  PROT 6.0*  ALBUMIN 3.7   Recent Labs  Lab 07/25/18 1451  LIPASE 28   No results for input(s): AMMONIA in the last 168 hours. Coagulation Profile: No results for input(s): INR, PROTIME in the last 168 hours. Cardiac Enzymes: No results for input(s): CKTOTAL, CKMB, CKMBINDEX, TROPONINI in the last 168 hours. BNP (last 3 results) No results for input(s): PROBNP in the last 8760 hours. HbA1C: No results for input(s): HGBA1C in the last 72 hours. CBG: No results for input(s): GLUCAP in the last 168 hours. Lipid Profile: No results for input(s): CHOL, HDL, LDLCALC, TRIG, CHOLHDL, LDLDIRECT in the last 72 hours. Thyroid Function Tests: No results for input(s): TSH, T4TOTAL, FREET4, T3FREE, THYROIDAB in the last 72 hours. Anemia Panel: No results for input(s): VITAMINB12, FOLATE, FERRITIN, TIBC, IRON, RETICCTPCT in the last  72 hours. Urine analysis:    Component Value Date/Time   COLORURINE YELLOW 07/25/2018 1418   APPEARANCEUR HAZY (A) 07/25/2018 1418   LABSPEC 1.009 07/25/2018 1418   PHURINE 6.0 07/25/2018 1418   GLUCOSEU NEGATIVE 07/25/2018 1418   HGBUR MODERATE (A) 07/25/2018 1418   BILIRUBINUR NEGATIVE 07/25/2018 1418   KETONESUR NEGATIVE 07/25/2018 1418   PROTEINUR NEGATIVE 07/25/2018 1418   NITRITE NEGATIVE 07/25/2018 1418   LEUKOCYTESUR LARGE (A) 07/25/2018 1418   Sepsis Labs: @LABRCNTIP (procalcitonin:4,lacticidven:4) )No results found for this or any previous visit (from the past 240 hour(s)).   Radiological Exams on Admission: Ct Abdomen Pelvis W Contrast  Result Date: 07/25/2018 CLINICAL DATA:  Lower abdominal pain, nausea. Prior appendectomy and bilateral tubal ligation. EXAM: CT ABDOMEN AND PELVIS WITH CONTRAST TECHNIQUE: Multidetector CT imaging of the abdomen and pelvis was performed using the standard protocol following  bolus administration of intravenous contrast. CONTRAST:  172mL OMNIPAQUE IOHEXOL 300 MG/ML  SOLN COMPARISON:  05/16/2017 CT abdomen/pelvis. FINDINGS: Lower chest: No significant pulmonary nodules or acute consolidative airspace disease. Hepatobiliary: Normal size liver. Granulomatous anterior liver calcification. No liver mass. Normal gallbladder with no radiopaque cholelithiasis. No biliary ductal dilatation. Pancreas: Normal, with no mass or duct dilation. Spleen: Normal size. No mass. Adrenals/Urinary Tract: Normal adrenals. There are a total of 4 stones in the left lumbar ureter, 3 of which are clustered in the lower left lumbar ureter measuring up to 12 mm, and 1 of which is located in the proximal left lumbar ureter measuring 7 mm. Moderate left hydroureteronephrosis. No right hydronephrosis. Several nonobstructing right renal stones, largest 5 mm in the upper right kidney. Several nonobstructing stones throughout the left kidney, largest 5 mm in the lower left kidney. No renal masses. Bladder obscured by streak artifact from left hip hardware. Bladder appears normal. Stomach/Bowel: Normal non-distended stomach. Normal caliber small bowel with no small bowel wall thickening. Appendectomy. Normal large bowel with no diverticulosis, large bowel wall thickening or pericolonic fat stranding. Vascular/Lymphatic: Atherosclerotic nonaneurysmal abdominal aorta. Patent portal, splenic, hepatic and renal veins. No pathologically enlarged lymph nodes in the abdomen or pelvis. Reproductive: Grossly normal uterus with no appreciable adnexal mass, noting limited visualization of the pelvis due to streak artifact from left hip hardware. Other: No pneumoperitoneum, ascites or focal fluid collection. Musculoskeletal: No aggressive appearing focal osseous lesions. Left total hip arthroplasty. Marked lumbar spondylosis. IMPRESSION: 1. At least 4 stones in the lumbar left ureter, largest 12 mm in the lower left lumbar ureter,  with moderate left hydroureteronephrosis. 2. Several nonobstructing stones in both kidneys. 3.  Aortic Atherosclerosis (ICD10-I70.0). Electronically Signed   By: Ilona Sorrel M.D.   On: 07/25/2018 17:57    EKG: Not performed.   Assessment/Plan   1. Renal and ureteral calculi with right hydroureteronephrosis  - Presents with severe left flank pain x1 week with nausea  - CT reveals at least 4 stones in left ureter, largest measuring 12 mm, with moderate left hydroureteronephrosis  - She is not septic, renal function is preserved  - Urology is consulting and much appreciated; planning tentatively for ureteral stent placement on 9/29 and recommending empiric antibiotic  - Urine sent for culture; she reports life-threatening allergy to multiple antibiotics, will use meropenem   - Continue supportive care, empiric meropenem   2. COPD  - No wheezing or SOB  - Continue as-needed nebs     DVT prophylaxis: SCD's  Code Status: Full  Family Communication: Discussed with patient  Consults called: Urology Admission status:  Inpatient     Vianne Bulls, MD [facility] Pager [phone]  If 7PM-7AM, please contact night-coverage [email or web address] Password [email or web address]  07/25/2018, 11:11 PM

## 2018-07-25 NOTE — ED Notes (Signed)
Pt given [place] sandwich, ok by Urologist at bedside

## 2018-07-25 NOTE — ED Provider Notes (Signed)
[facility] EMERGENCY DEPARTMENT Provider Note   CSN: [id number] Arrival date & time: 07/25/18  1320     History   Chief Complaint Chief Complaint  Patient presents with  . Abdominal Pain    HPI Stacey Herman is a 62 y.o. female.  HPI   62 year old female with lower abdominal pain.  His left lower quadrant to left flank.  Onset about a week ago.  Pain is been fairly constant.  She feels like it is been progressive.  Associate with nausea.  No vomiting.  Some pelvic pressure when she tries to urinate but no dysuria.  No fevers or chills.  No unusual vaginal bleeding or discharge.  Past Medical History:  Diagnosis Date  . Acid reflux   . COPD (chronic obstructive pulmonary disease) ([place])   . Kidney stones   . Medical history non-contributory   . Renal disorder     Patient Active Problem List   Diagnosis Date Noted  . SIRS (systemic inflammatory response syndrome) ([place]) 08/24/2017  . Elevated lactic acid level 08/24/2017  . Hypokalemia 08/24/2017  . Hyperglycemia 08/24/2017  . COPD exacerbation ([place]) 08/23/2017  . Chest pain 07/20/2017    Past Surgical History:  Procedure Laterality Date  . APPENDECTOMY    . BACK SURGERY    . CERVICAL FUSION    . TOTAL HIP ARTHROPLASTY       OB History   None      Home Medications    Prior to Admission medications   Medication Sig Start Date End Date Taking? Authorizing Provider  acetaminophen (TYLENOL) 325 MG tablet Take 325-650 mg by mouth every 6 (six) hours as needed (for headaches or pain).   Yes [provider]  albuterol (PROVENTIL) (2.5 MG/3ML) 0.083% nebulizer solution Take 3 mLs (2.5 mg total) by nebulization every 6 (six) hours as needed for wheezing. 08/26/17  Yes Hosie Poisson, MD  cyclobenzaprine (FLEXERIL) 5 MG tablet Take 5 mg by mouth 3 (three) times daily as needed for muscle spasms.   Yes [provider]  Multiple Vitamin (MULTIVITAMIN WITH MINERALS) TABS tablet Take  1 tablet by mouth daily.   Yes [provider]  oxyCODONE-acetaminophen (PERCOCET) 10-325 MG tablet Take 1 tablet by mouth every 6 (six) hours as needed for pain. Patient taking differently: Take 1 tablet by mouth 3 (three) times daily.  01/30/17  Yes Orlie Dakin, MD  ipratropium-albuterol (DUONEB) 0.5-2.5 (3) MG/3ML SOLN Take 3 mLs by nebulization every 6 (six) hours as needed. Patient not taking: Reported on 07/25/2018 08/26/17   Hosie Poisson, MD  nicotine (NICODERM CQ - DOSED IN MG/24 HOURS) 21 mg/24hr patch Place 1 patch (21 mg total) onto the skin daily. Patient not taking: Reported on 07/25/2018 08/27/17   Hosie Poisson, MD  predniSONE (STERAPRED UNI-PAK 21 TAB) 10 MG (21) TBPK tablet Prednisone 40 mg daily for 3 days followed by  Prednisone 30 mg daily for 3 days followed by  Prednisone 20 mg daily for 3 days. Patient not taking: Reported on 07/25/2018 08/26/17   Hosie Poisson, MD  traZODone (DESYREL) 50 MG tablet Take 1 tablet (50 mg total) by mouth at bedtime as needed for sleep. Patient not taking: Reported on 07/25/2018 07/21/17   Geradine Girt, DO    Family History Family History  Problem Relation Age of Onset  . Cancer Mother   . Heart attack Father   . Heart attack Paternal Grandmother     Social History Social History   Tobacco  Use  . Smoking status: Current Every Day Smoker    Packs/day: 0.25  . Smokeless tobacco: Never Used  Substance Use Topics  . Alcohol use: No  . Drug use: No     Allergies   Amoxicillin; Aspirin; Benadryl [diphenhydramine]; Ciprocin-fluocin-procin [fluocinolone]; Morphine and related; Mushroom extract complex; Penicillins; and Sulfa antibiotics   Review of Systems Review of Systems  All systems reviewed and negative, other than as noted in HPI.  Physical Exam Updated Vital Signs BP 101/69   Pulse 64   Temp 98.1 F (36.7 C) (Oral)   Resp 18   Ht 5' 10.5" (1.791 m)   Wt 82.1 kg   SpO2 93%   BMI 25.60 kg/m    Physical Exam  Constitutional: She appears well-developed and well-nourished. No distress.  HENT:  Head: Normocephalic and atraumatic.  Eyes: Conjunctivae are normal. Right eye exhibits no discharge. Left eye exhibits no discharge.  Neck: Neck supple.  Cardiovascular: Normal rate, regular rhythm and normal heart sounds. Exam reveals no gallop and no friction rub.  No murmur heard. Pulmonary/Chest: Effort normal and breath sounds normal. No respiratory distress.  Abdominal: Soft. She exhibits no distension. There is tenderness.  L abdominal tenderness w/o rebound or guarding.   Musculoskeletal: She exhibits no edema or tenderness.  Neurological: She is alert.  Skin: Skin is warm and dry.  Psychiatric: She has a normal mood and affect. Her behavior is normal. Thought content normal.  Nursing note and vitals reviewed.    ED Treatments / Results  Labs (all labs ordered are listed, but only abnormal results are displayed) Labs Reviewed  COMPREHENSIVE METABOLIC PANEL - Abnormal; Notable for the following components:      Result Value   Total Protein 6.0 (*)    AST 14 (*)    All other components within normal limits  URINALYSIS, ROUTINE W REFLEX MICROSCOPIC - Abnormal; Notable for the following components:   APPearance HAZY (*)    Hgb urine dipstick MODERATE (*)    Leukocytes, UA LARGE (*)    Bacteria, UA RARE (*)    All other components within normal limits  I-STAT BETA HCG BLOOD, ED ([facility], [facility], [facility] ONLY) - Abnormal; Notable for the following components:   I-stat hCG, quantitative 12.3 (*)    All other components within normal limits  LIPASE, BLOOD  CBC    EKG None  Radiology Ct Abdomen Pelvis W Contrast  Result Date: 07/25/2018 CLINICAL DATA:  Lower abdominal pain, nausea. Prior appendectomy and bilateral tubal ligation. EXAM: CT ABDOMEN AND PELVIS WITH CONTRAST TECHNIQUE: Multidetector CT imaging of the abdomen and pelvis was performed using the standard protocol following  bolus administration of intravenous contrast. CONTRAST:  110mL OMNIPAQUE IOHEXOL 300 MG/ML  SOLN COMPARISON:  05/16/2017 CT abdomen/pelvis. FINDINGS: Lower chest: No significant pulmonary nodules or acute consolidative airspace disease. Hepatobiliary: Normal size liver. Granulomatous anterior liver calcification. No liver mass. Normal gallbladder with no radiopaque cholelithiasis. No biliary ductal dilatation. Pancreas: Normal, with no mass or duct dilation. Spleen: Normal size. No mass. Adrenals/Urinary Tract: Normal adrenals. There are a total of 4 stones in the left lumbar ureter, 3 of which are clustered in the lower left lumbar ureter measuring up to 12 mm, and 1 of which is located in the proximal left lumbar ureter measuring 7 mm. Moderate left hydroureteronephrosis. No right hydronephrosis. Several nonobstructing right renal stones, largest 5 mm in the upper right kidney. Several nonobstructing stones throughout the left kidney, largest 5 mm in the lower  left kidney. No renal masses. Bladder obscured by streak artifact from left hip hardware. Bladder appears normal. Stomach/Bowel: Normal non-distended stomach. Normal caliber small bowel with no small bowel wall thickening. Appendectomy. Normal large bowel with no diverticulosis, large bowel wall thickening or pericolonic fat stranding. Vascular/Lymphatic: Atherosclerotic nonaneurysmal abdominal aorta. Patent portal, splenic, hepatic and renal veins. No pathologically enlarged lymph nodes in the abdomen or pelvis. Reproductive: Grossly normal uterus with no appreciable adnexal mass, noting limited visualization of the pelvis due to streak artifact from left hip hardware. Other: No pneumoperitoneum, ascites or focal fluid collection. Musculoskeletal: No aggressive appearing focal osseous lesions. Left total hip arthroplasty. Marked lumbar spondylosis. IMPRESSION: 1. At least 4 stones in the lumbar left ureter, largest 12 mm in the lower left lumbar ureter,  with moderate left hydroureteronephrosis. 2. Several nonobstructing stones in both kidneys. 3.  Aortic Atherosclerosis (ICD10-I70.0). Electronically Signed   By: Ilona Sorrel M.D.   On: 07/25/2018 17:57    Procedures Procedures (including critical care time)  Medications Ordered in ED Medications  0.9 %  sodium chloride infusion ( Intravenous New Bag/Given 07/25/18 1625)  oxyCODONE-acetaminophen (PERCOCET/ROXICET) 5-325 MG per tablet 1 tablet (1 tablet Oral Given 07/25/18 1616)  ondansetron (ZOFRAN) injection 4 mg (4 mg Intravenous Given 07/25/18 1625)  iohexol (OMNIPAQUE) 300 MG/ML solution 100 mL (100 mLs Intravenous Contrast Given 07/25/18 1713)  ketorolac (TORADOL) 15 MG/ML injection 15 mg (15 mg Intravenous Given 07/25/18 1824)  HYDROmorphone (DILAUDID) injection 0.5 mg (0.5 mg Intravenous Given 07/25/18 1826)     Initial Impression / Assessment and Plan / ED Course  I have reviewed the triage vital signs and the nursing notes.  Pertinent labs & imaging results that were available during my care of the patient were reviewed by me and considered in my medical decision making (see chart for details).     62 year old female with left-sided abdominal pain.  Imaging significant for four left ureteral stones up to 12 mm.  She is afebrile.  No leukocytosis.  Renal function is okay..  There is no medical emergency here but she has had symptoms ongoing for about a week, her pain is poorly controlled and she is realistically going to need intervention at some point.  Will discuss with urology.   Final Clinical Impressions(s) / ED Diagnoses   Final diagnoses:  Ureteral stone  Other hydronephrosis    ED Discharge Orders    None       Virgel Manifold, MD 08/04/18 [phone]

## 2018-07-25 NOTE — ED Notes (Signed)
ED Provider at bedside. 

## 2018-07-25 NOTE — H&P (View-Only) (Signed)
H&P Physician requesting consult: Mitzi Hansen, MD  Chief Complaint: Multiple left ureteral calculi, left flank pain, hydronephrosis  History of Present Illness: 62 year old female with history of COPD and nephrolithiasis presents with a weeklong history of left-sided flank pain.  Her pain is unable to be controlled.  She has multiple allergies to antibiotics.  White blood cell count and creatinine are normal.  Urinalysis does show large leukocyte, rare bacteria, mucus, 21-50 WBCs.  She is afebrile with vital signs stable but still has persistent left-sided pain and desires some relief.  Pain medication has not relieved her pain enough.  Past Medical History:  Diagnosis Date  . Acid reflux   . COPD (chronic obstructive pulmonary disease) ([place])   . Kidney stones   . Medical history non-contributory   . Renal disorder    Past Surgical History:  Procedure Laterality Date  . APPENDECTOMY    . BACK SURGERY    . CERVICAL FUSION    . TOTAL HIP ARTHROPLASTY      Home Medications:   (Not in a hospital admission) Allergies:  Allergies  Allergen Reactions  . Amoxicillin Anaphylaxis and Hives  . Aspirin Anaphylaxis and Hives  . Benadryl [Diphenhydramine] Anaphylaxis and Hives  . Ciprocin-Fluocin-Procin [Fluocinolone] Anaphylaxis and Hives  . Morphine And Related Anaphylaxis and Hives  . Mushroom Extract Complex Anaphylaxis and Hives  . Penicillins Anaphylaxis and Hives    Unknown Has patient had a PCN reaction causing immediate rash, facial/tongue/throat swelling, SOB or lightheadedness with hypotension: Yes Has patient had a PCN reaction causing severe rash involving mucus membranes or skin necrosis: Yes Has patient had a PCN reaction that required hospitalization: Yes Has patient had a PCN reaction occurring within the last 10 years: Yes If all of the above answers are "NO", then may proceed with Cephalosporin use.   . Sulfa Antibiotics Anaphylaxis and Hives    Family History   Problem Relation Age of Onset  . Cancer Mother   . Heart attack Father   . Heart attack Paternal Grandmother    Social History:  reports that she has been smoking. She has been smoking about 0.25 packs per day. She has never used smokeless tobacco. She reports that she does not drink alcohol or use drugs.  ROS: A complete review of systems was performed.  All systems are negative except for pertinent findings as noted. ROS   Physical Exam:  Vital signs in last 24 hours: Temp:  [98.1 F (36.7 C)] 98.1 F (36.7 C) (09/28 1419) Pulse Rate:  [63-98] 63 (09/28 2109) Resp:  [18-20] 18 (09/28 2109) BP: (101-114)/(60-99) 111/60 (09/28 2109) SpO2:  [93 %-97 %] 93 % (09/28 2109) Weight:  [82.1 kg] 82.1 kg (09/28 1419) General:  Alert and oriented, No acute distress HEENT: Normocephalic, atraumatic Neck: No JVD or lymphadenopathy Cardiovascular: Regular rate and rhythm Lungs: Regular rate and effort Abdomen: Soft, nontender, nondistended, no abdominal masses Back: No CVA tenderness Extremities: No edema Neurologic: Grossly intact  Laboratory Data:  Results for orders placed or performed during the hospital encounter of 07/25/18 (from the past 24 hour(s))  Urinalysis, Routine w reflex microscopic     Status: Abnormal   Collection Time: 07/25/18  2:18 PM  Result Value Ref Range   Color, Urine YELLOW YELLOW   APPearance HAZY (A) CLEAR   Specific Gravity, Urine 1.009 1.005 - 1.030   pH 6.0 5.0 - 8.0   Glucose, UA NEGATIVE NEGATIVE mg/dL   Hgb urine dipstick MODERATE (A) NEGATIVE  Bilirubin Urine NEGATIVE NEGATIVE   Ketones, ur NEGATIVE NEGATIVE mg/dL   Protein, ur NEGATIVE NEGATIVE mg/dL   Nitrite NEGATIVE NEGATIVE   Leukocytes, UA LARGE (A) NEGATIVE   RBC / HPF 6-10 0 - 5 RBC/hpf   WBC, UA 21-50 0 - 5 WBC/hpf   Bacteria, UA RARE (A) NONE SEEN   Squamous Epithelial / LPF 6-10 0 - 5   Mucus PRESENT   Lipase, blood     Status: None   Collection Time: 07/25/18  2:51 PM  Result  Value Ref Range   Lipase 28 11 - 51 U/L  Comprehensive metabolic panel     Status: Abnormal   Collection Time: 07/25/18  2:51 PM  Result Value Ref Range   Sodium 135 135 - 145 mmol/L   Potassium 3.8 3.5 - 5.1 mmol/L   Chloride 100 98 - 111 mmol/L   CO2 30 22 - 32 mmol/L   Glucose, Bld 87 70 - 99 mg/dL   BUN 7 6 - 20 mg/dL   Creatinine, Ser 0.75 0.44 - 1.00 mg/dL   Calcium 8.9 8.9 - 10.3 mg/dL   Total Protein 6.0 (L) 6.5 - 8.1 g/dL   Albumin 3.7 3.5 - 5.0 g/dL   AST 14 (L) 15 - 41 U/L   ALT 12 0 - 44 U/L   Alkaline Phosphatase 81 38 - 126 U/L   Total Bilirubin 0.7 0.3 - 1.2 mg/dL   GFR calc non Af Amer >60 >60 mL/min   GFR calc Af Amer >60 >60 mL/min   Anion gap 5 5 - 15  CBC     Status: None   Collection Time: 07/25/18  2:51 PM  Result Value Ref Range   WBC 6.2 4.0 - 10.5 K/uL   RBC 4.43 3.87 - 5.11 MIL/uL   Hemoglobin 14.4 12.0 - 15.0 g/dL   HCT 43.3 36.0 - 46.0 %   MCV 97.7 78.0 - 100.0 fL   MCH 32.5 26.0 - 34.0 pg   MCHC 33.3 30.0 - 36.0 g/dL   RDW 12.4 11.5 - 15.5 %   Platelets 187 150 - 400 K/uL  I-Stat beta hCG blood, ED     Status: Abnormal   Collection Time: 07/25/18  2:57 PM  Result Value Ref Range   I-stat hCG, quantitative 12.3 (H) <5 mIU/mL   Comment 3           No results found for this or any previous visit (from the past 240 hour(s)). Creatinine: Recent Labs    07/25/18 1451  CREATININE 0.75    Impression/Assessment:  Left and ureteral calculi Left flank pain secondary to renal colic Hydronephrosis secondary to ureteral calculus  Plan:  Plan to perform cystoscopy with left ureteral stent tomorrow tentatively.  Antibiotics per internal medicine given her multiple allergies.  Would recommend 7-day course of antibiotics given her urinalysis unless her urine culture comes back negative.  Marton Redwood, III 07/25/2018, 9:17 PM

## 2018-07-25 NOTE — ED Triage Notes (Signed)
Pt reports lower abd pain x1 week, saw her pcp who she states did not do anything except send her to the ER. Reports some nausea, denies any pain with urination.

## 2018-07-26 ENCOUNTER — Other Ambulatory Visit: Payer: Self-pay

## 2018-07-26 ENCOUNTER — Encounter (HOSPITAL_COMMUNITY): Payer: Self-pay | Admitting: Anesthesiology

## 2018-07-26 ENCOUNTER — Inpatient Hospital Stay (HOSPITAL_COMMUNITY): Payer: Medicare Other

## 2018-07-26 ENCOUNTER — Inpatient Hospital Stay (HOSPITAL_COMMUNITY): Payer: Medicare Other | Admitting: Anesthesiology

## 2018-07-26 ENCOUNTER — Encounter (HOSPITAL_COMMUNITY)
Admission: EM | Disposition: A | Discharge status: Home / Self Care | Payer: Self-pay | Source: Home / Self Care | Attending: Internal Medicine

## 2018-07-26 DIAGNOSIS — R531 Weakness: Secondary | ICD-10-CM | POA: Diagnosis not present

## 2018-07-26 DIAGNOSIS — J449 Chronic obstructive pulmonary disease, unspecified: Secondary | ICD-10-CM | POA: Diagnosis not present

## 2018-07-26 DIAGNOSIS — K219 Gastro-esophageal reflux disease without esophagitis: Secondary | ICD-10-CM | POA: Diagnosis not present

## 2018-07-26 DIAGNOSIS — N132 Hydronephrosis with renal and ureteral calculous obstruction: Principal | ICD-10-CM

## 2018-07-26 DIAGNOSIS — R109 Unspecified abdominal pain: Secondary | ICD-10-CM | POA: Diagnosis not present

## 2018-07-26 HISTORY — PX: CYSTOSCOPY WITH STENT PLACEMENT: SHX5790

## 2018-07-26 LAB — BASIC METABOLIC PANEL
ANION GAP: 5 (ref 5–15)
BUN: 9 mg/dL (ref 6–20)
CO2: 33 mmol/L — ABNORMAL HIGH (ref 22–32)
Calcium: 8.9 mg/dL (ref 8.9–10.3)
Chloride: 102 mmol/L (ref 98–111)
Creatinine, Ser: 0.7 mg/dL (ref 0.44–1.00)
GFR calc Af Amer: >60 mL/min (ref >60)
GFR calc non Af Amer: >60 mL/min (ref >60)
GLUCOSE: 93 mg/dL (ref 70–99)
POTASSIUM: 4.3 mmol/L (ref 3.5–5.1)
Sodium: 140 mmol/L (ref 135–145)

## 2018-07-26 LAB — CBC
HEMATOCRIT: 43.5 % (ref 36.0–46.0)
HEMOGLOBIN: 14.2 g/dL (ref 12.0–15.0)
MCH: 32 pg (ref 26.0–34.0)
MCHC: 32.6 g/dL (ref 30.0–36.0)
MCV: 98 fL (ref 78.0–100.0)
Platelets: 195 10*3/uL (ref 150–400)
RBC: 4.44 MIL/uL (ref 3.87–5.11)
RDW: 13.1 % (ref 11.5–15.5)
WBC: 5.9 10*3/uL (ref 4.0–10.5)

## 2018-07-26 LAB — SURGICAL PCR SCREEN
MRSA, PCR: NEGATIVE
Staphylococcus aureus: NEGATIVE

## 2018-07-26 LAB — GLUCOSE, CAPILLARY: Glucose-Capillary: 83 mg/dL (ref 70–99)

## 2018-07-26 SURGERY — CYSTOSCOPY, WITH STENT INSERTION
Anesthesia: General | Site: Ureter | Laterality: Left

## 2018-07-26 MED ORDER — HYDROMORPHONE HCL 1 MG/ML IJ SOLN: 0.2500 mg | INTRAMUSCULAR | Status: DC | PRN

## 2018-07-26 MED ORDER — LACTATED RINGERS IV SOLN
INTRAVENOUS | Status: DC | PRN
  Administered 2018-07-26: 09:00:00 via INTRAVENOUS

## 2018-07-26 MED ORDER — LIDOCAINE 2% (20 MG/ML) 5 ML SYRINGE
INTRAMUSCULAR | Status: DC | PRN
  Administered 2018-07-26: 50 mg via INTRAVENOUS

## 2018-07-26 MED ORDER — PROMETHAZINE HCL 25 MG/ML IJ SOLN: 6.2500 mg | INTRAMUSCULAR | Status: DC | PRN

## 2018-07-26 MED ORDER — MIDAZOLAM HCL 5 MG/5ML IJ SOLN
INTRAMUSCULAR | Status: DC | PRN
  Administered 2018-07-26: 2 mg via INTRAVENOUS

## 2018-07-26 MED ORDER — EPHEDRINE 5 MG/ML INJ
INTRAVENOUS | Status: AC
  Filled 2018-07-26: qty 10

## 2018-07-26 MED ORDER — PROPOFOL 10 MG/ML IV BOLUS
INTRAVENOUS | Status: DC | PRN
  Administered 2018-07-26: 150 mg via INTRAVENOUS

## 2018-07-26 MED ORDER — PROPOFOL 10 MG/ML IV BOLUS
INTRAVENOUS | Status: AC
  Filled 2018-07-26: qty 20

## 2018-07-26 MED ORDER — IOHEXOL 300 MG/ML  SOLN
INTRAMUSCULAR | Status: DC | PRN
  Administered 2018-07-26: 40 mL via URETHRAL

## 2018-07-26 MED ORDER — FENTANYL CITRATE (PF) 100 MCG/2ML IJ SOLN
INTRAMUSCULAR | Status: AC
  Filled 2018-07-26: qty 2

## 2018-07-26 MED ORDER — FENTANYL CITRATE (PF) 100 MCG/2ML IJ SOLN
INTRAMUSCULAR | Status: DC | PRN
  Administered 2018-07-26 (×4): 25 ug via INTRAVENOUS

## 2018-07-26 MED ORDER — LIDOCAINE 2% (20 MG/ML) 5 ML SYRINGE
INTRAMUSCULAR | Status: AC
  Filled 2018-07-26: qty 5

## 2018-07-26 MED ORDER — ONDANSETRON HCL 4 MG/2ML IJ SOLN
INTRAMUSCULAR | Status: AC
  Filled 2018-07-26: qty 2

## 2018-07-26 MED ORDER — SODIUM CHLORIDE 0.9 % IV SOLN
INTRAVENOUS | Status: DC | PRN
  Administered 2018-07-26: 1000 mL via INTRAVENOUS

## 2018-07-26 MED ORDER — LACTATED RINGERS IV SOLN: INTRAVENOUS | Status: DC

## 2018-07-26 MED ORDER — ONDANSETRON HCL 4 MG/2ML IJ SOLN
INTRAMUSCULAR | Status: DC | PRN
  Administered 2018-07-26: 4 mg via INTRAVENOUS

## 2018-07-26 MED ORDER — SODIUM CHLORIDE 0.9 % IR SOLN
Status: DC | PRN
  Administered 2018-07-26: 3000 mL

## 2018-07-26 MED ORDER — DEXAMETHASONE SODIUM PHOSPHATE 10 MG/ML IJ SOLN
INTRAMUSCULAR | Status: AC
  Filled 2018-07-26: qty 1

## 2018-07-26 MED ORDER — MEPERIDINE HCL 50 MG/ML IJ SOLN: 6.2500 mg | INTRAMUSCULAR | Status: DC | PRN

## 2018-07-26 MED ORDER — MUPIROCIN 2 % EX OINT
TOPICAL_OINTMENT | CUTANEOUS | Status: AC
  Administered 2018-07-26: 12:00:00
  Filled 2018-07-26: qty 22

## 2018-07-26 MED ORDER — MIDAZOLAM HCL 2 MG/2ML IJ SOLN
INTRAMUSCULAR | Status: AC
  Filled 2018-07-26: qty 2

## 2018-07-26 MED ORDER — EPHEDRINE SULFATE-NACL 50-0.9 MG/10ML-% IV SOSY
PREFILLED_SYRINGE | INTRAVENOUS | Status: DC | PRN
  Administered 2018-07-26: 5 mg via INTRAVENOUS

## 2018-07-26 MED ORDER — PHENYLEPHRINE 40 MCG/ML (10ML) SYRINGE FOR IV PUSH (FOR BLOOD PRESSURE SUPPORT)
PREFILLED_SYRINGE | INTRAVENOUS | Status: AC
  Filled 2018-07-26: qty 10

## 2018-07-26 MED ORDER — PHENYLEPHRINE 40 MCG/ML (10ML) SYRINGE FOR IV PUSH (FOR BLOOD PRESSURE SUPPORT)
PREFILLED_SYRINGE | INTRAVENOUS | Status: DC | PRN
  Administered 2018-07-26: 80 ug via INTRAVENOUS
  Administered 2018-07-26: 40 ug via INTRAVENOUS

## 2018-07-26 SURGICAL SUPPLY — 13 items
BAG URO CATCHER STRL LF (MISCELLANEOUS) ×3 IMPLANT
CATH INTERMIT  6FR 70CM (CATHETERS) ×3 IMPLANT
CATH URET 5FR 28IN OPEN ENDED (CATHETERS) ×2 IMPLANT
CLOTH BEACON ORANGE TIMEOUT ST (SAFETY) ×3 IMPLANT
GLOVE BIO SURGEON STRL SZ7.5 (GLOVE) ×3 IMPLANT
GOWN STRL REUS W/TWL LRG LVL3 (GOWN DISPOSABLE) ×6 IMPLANT
GUIDEWIRE STR DUAL SENSOR (WIRE) ×3 IMPLANT
GUIDEWIRE ZIPWRE .038 STRAIGHT (WIRE) ×2 IMPLANT
MANIFOLD NEPTUNE II (INSTRUMENTS) ×3 IMPLANT
PACK CYSTO (CUSTOM PROCEDURE TRAY) ×3 IMPLANT
STENT CONTOUR 6FRX26X.038 (STENTS) ×2 IMPLANT
TUBING CONNECTING 10 (TUBING) ×2 IMPLANT
TUBING CONNECTING 10' (TUBING) ×1

## 2018-07-26 NOTE — Anesthesia Preprocedure Evaluation (Addendum)
Anesthesia Evaluation  Patient identified by MRN, date of birth, ID band Patient awake    Reviewed: Allergy & Precautions, NPO status , Patient's Chart, lab work & pertinent test results  Airway Mallampati: I  TM Distance: >3 FB Neck ROM: Full    Dental  (+) Edentulous Upper, Edentulous Lower   Pulmonary COPD,  COPD inhaler, Current Smoker,     + decreased breath sounds      Cardiovascular negative cardio ROS   Rhythm:Regular Rate:Normal     Neuro/Psych negative neurological ROS  negative psych ROS   GI/Hepatic Neg liver ROS, GERD  ,  Endo/Other  negative endocrine ROS  Renal/GU Renal disease     Musculoskeletal negative musculoskeletal ROS (+)   Abdominal (+) + obese,   Peds  Hematology negative hematology ROS (+)   Anesthesia Other Findings   Reproductive/Obstetrics                            Lab Results  Component Value Date   WBC 5.9 07/26/2018   HGB 14.2 07/26/2018   HCT 43.5 07/26/2018   MCV 98.0 07/26/2018   PLT 195 07/26/2018   Lab Results  Component Value Date   CREATININE 0.70 07/26/2018   BUN 9 07/26/2018   NA 140 07/26/2018   K 4.3 07/26/2018   CL 102 07/26/2018   CO2 33 (H) 07/26/2018   No results found for: INR, PROTIME  EKG: sinus tachycardia.  Anesthesia Physical Anesthesia Plan  ASA: II and emergent  Anesthesia Plan: General   Post-op Pain Management:    Induction: Intravenous  PONV Risk Score and Plan: 3 and Ondansetron, Dexamethasone and Midazolam  Airway Management Planned: LMA  Additional Equipment: None  Intra-op Plan:   Post-operative Plan: Extubation in OR  Informed Consent: I have reviewed the patients History and Physical, chart, labs and discussed the procedure including the risks, benefits and alternatives for the proposed anesthesia with the patient or authorized representative who has indicated his/her understanding and  acceptance.   Dental advisory given  Plan Discussed with: CRNA  Anesthesia Plan Comments:        Anesthesia Quick Evaluation

## 2018-07-26 NOTE — Op Note (Signed)
Operative Note  Preoperative diagnosis:  1.  Left ureteral calculus  Post operative diagnosis: 1.  Left ureteral calculus  Procedure(s): 1.  Cystoscopy with left retrograde pyelogram and left ureteral stent placement  Surgeon: Link Snuffer, MD  Assistants: None  Anesthesia: General  Complications: None immediate  EBL: Minimal  Specimens: 1.  None  Drains/Catheters: 1.  6 X 26 double-J ureteral stent  Intraoperative findings: 1.  Normal urethra and bladder 2.  Left retrograde pyelogram revealed a filling defect at the level of the stones which were radiopaque with upstream hydroureteronephrosis.  There was severe hydronephrosis.  She had a very large renal pelvis.  The large dural calculi were very difficult to pass.  The proximal curl was in the very distal portion of the renal pelvis but I could not navigate it any further.  Indication: 62 year old female found to have multiple large mid ureteral calculi on the left.  She also had a urinalysis consistent with possible infection as evidenced by large leukocyte, rare bacteria, 21-50 WBCs.  Given this finding and her continued flank pain, we elected to undergo the above operation.  Description of procedure:  The patient was identified and consent was obtained.  The patient was taken to the operating room and placed in the supine position.  The patient was placed under general anesthesia.  Perioperative antibiotics were administered.  The patient was placed in dorsal lithotomy.  Patient was prepped and draped in a standard sterile fashion and a timeout was performed.  A 21 French rigid cystoscope was advanced into the urethra and into the bladder.  The left distal most portion of the ureter was cannulated with an open-ended ureteral catheter.  Retrograde pyelogram was performed and no contrast extended past the stones.  I advanced a sensor wire up the ureter under continuous fluoroscopic guidance but was unable to pass the wire past the  stone.  I advanced the open-ended ureteral catheter up to the stone and tried to use this to help navigate the sensor wire but this was unsuccessful.  I exchanged this for a Glidewire which I was finally able to navigate past the stone and into the kidney.  I tried to advance the open-ended ureteral catheter passed the stones but there was too much resistance.  I was unable to pass it.  I therefore remove the open-ended ureteral catheter.  I then advanced a 6 x 26 double-J ureteral stent over the wire.  This was difficult the past but I was able to get a pass the stones.  I then withdrew the wire and the stent migrated down and appeared to be low.  It was however passed the stones.  Given that it was low, I pulled the stent just beyond the urethral meatus and advanced the sensor wire up to the kidney.  I remove the stent.  I then advanced the 5 [place] open-ended ureteral catheter over the wire which was able to pass the stones and then I remove the wire and shot a retrograde pyelogram which showed severe hydronephrosis.  I replaced the sensor wire and remove the open-ended ureteral catheter.  I backloaded the wire onto a rigid cystoscope and advanced that into the bladder.  I then advanced a 6 x 26 double-J ureteral stent and remove the wire.  This time, the stent was also very difficult to advance past the stones but I was able to navigate it passed the stones but the proximal portion of the stent could only reach the very distal  portion of the renal pelvis.  The wire was withdrawn.  Large amount of E flux was seen coming from the stent.  The bladder was drained and the scope withdrawn.  This concluded the operation.  Patient tolerated procedure well and was stable postoperatively.  Plan: Continue antibiotics.  Perhaps she can be discharged with Macrobid for 7 days.  We are very limited in oral agents that she can take.  She should return to my office in 1 to 2 weeks for a urine culture and to set up  surgery.

## 2018-07-26 NOTE — Transfer of Care (Signed)
Immediate Anesthesia Transfer of Care Note  Patient: Stacey Herman  Procedure(s) Performed: Procedure(s): CYSTOSCOPY, LEFT RETROGRADE, WITH LEFT URETERAL STENT PLACEMENT (Left)  Patient Location: PACU  Anesthesia Type:General  Level of Consciousness:  sedated, patient cooperative and responds to stimulation  Airway & Oxygen Therapy:Patient Spontanous Breathing and Patient connected to face mask oxgen  Post-op Assessment:  Report given to PACU RN and Post -op Vital signs reviewed and stable  Post vital signs:  Reviewed and stable  Last Vitals:  Vitals:   07/25/18 2240 07/26/18 0614  BP: 96/68 99/66  Pulse: 71 68  Resp: 16 17  Temp: 36.8 C 36.8 C  SpO2: 01% 49%    Complications: No apparent anesthesia complications

## 2018-07-26 NOTE — Anesthesia Postprocedure Evaluation (Signed)
Anesthesia Post Note  Patient: Stacey Herman  Procedure(s) Performed: CYSTOSCOPY, LEFT RETROGRADE, WITH LEFT URETERAL STENT PLACEMENT (Left Ureter)     Patient location during evaluation: PACU Anesthesia Type: General Level of consciousness: awake and alert Pain management: pain level controlled Vital Signs Assessment: post-procedure vital signs reviewed and stable Respiratory status: spontaneous breathing, nonlabored ventilation, respiratory function stable and patient connected to nasal cannula oxygen Cardiovascular status: blood pressure returned to baseline and stable Postop Assessment: no apparent nausea or vomiting Anesthetic complications: no    Last Vitals:  Vitals:   07/26/18 1000 07/26/18 1020  BP: 120/69 116/82  Pulse: 84 78  Resp: 12 18  Temp: 36.6 C (!) 36.4 C  SpO2: 99% 95%    Last Pain:  Vitals:   07/26/18 1031  TempSrc:   PainSc: [place] Janos Shampine

## 2018-07-26 NOTE — Progress Notes (Signed)
Pharmacy Antibiotic Note  Raine Blodgett is a 62 y.o. female admitted on 07/25/2018 with UTI.  Pharmacy has been consulted for Meropenem dosing.  Plan: Meropenem 1gm iv q8hr  Height: 5' 10.5" (179.1 cm) Weight: 181 lb (82.1 kg) IBW/kg (Calculated) : 69.65  Temp (24hrs), Avg:98.2 F (36.8 C), Min:98.1 F (36.7 C), Max:98.3 F (36.8 C)  Recent Labs  Lab 07/25/18 1451  WBC 6.2  CREATININE 0.75    Estimated Creatinine Clearance: 82.3 mL/min (by C-G formula based on SCr of 0.75 mg/dL).    Allergies  Allergen Reactions  . Amoxicillin Anaphylaxis and Hives  . Aspirin Anaphylaxis and Hives  . Benadryl [Diphenhydramine] Anaphylaxis and Hives  . Ciprocin-Fluocin-Procin [Fluocinolone] Anaphylaxis and Hives  . Ciprofloxacin Anaphylaxis  . Morphine And Related Anaphylaxis and Hives  . Mushroom Extract Complex Anaphylaxis and Hives  . Penicillins Anaphylaxis and Hives    Unknown Has patient had a PCN reaction causing immediate rash, facial/tongue/throat swelling, SOB or lightheadedness with hypotension: Yes Has patient had a PCN reaction causing severe rash involving mucus membranes or skin necrosis: Yes Has patient had a PCN reaction that required hospitalization: Yes Has patient had a PCN reaction occurring within the last 10 years: Yes If all of the above answers are "NO", then may proceed with Cephalosporin use.   . Sulfa Antibiotics Anaphylaxis and Hives    Antimicrobials this admission: Meropenem  07/25/2018 >>  Dose adjustments this admission: -  Microbiology results: -  Thank you for allowing pharmacy to be a part of this patient's care.  Nani Skillern Crowford 07/26/2018 3:32 AM

## 2018-07-26 NOTE — Interval H&P Note (Signed)
History and Physical Interval Note:  07/26/2018 8:34 AM  Rayburn Felt  has presented today for surgery, with the diagnosis of left ureteral stone  The various methods of treatment have been discussed with the patient and family. After consideration of risks, benefits and other options for treatment, the patient has consented to  Procedure(s): [place] (Left) as a surgical intervention .  The patient's history has been reviewed, patient examined, no change in status, stable for surgery.  I have reviewed the patient's chart and labs.  Questions were answered to the patient's satisfaction.     Marton Redwood, III

## 2018-07-26 NOTE — Anesthesia Procedure Notes (Signed)
Procedure Name: LMA Insertion Date/Time: 07/26/2018 8:50 AM Performed by: Anne Fu, CRNA Pre-anesthesia Checklist: Patient identified, Emergency Drugs available, Suction available, Patient being monitored and Timeout performed Patient Re-evaluated:Patient Re-evaluated prior to induction Oxygen Delivery Method: Circle system utilized Preoxygenation: Pre-oxygenation with 100% oxygen Induction Type: IV induction Ventilation: Mask ventilation without difficulty LMA: LMA inserted LMA Size: 4.0 Number of attempts: 1 Placement Confirmation: positive ETCO2 and breath sounds checked- equal and bilateral Tube secured with: Tape

## 2018-07-26 NOTE — Progress Notes (Signed)
Patient sent to OR. Pre-OP checklist done, 1 CHG bath done, MRSA PCR sent. First treatment of Mupirocin done while awaiting results. All jewelry/teeth and undergarments removed. Pt voided. Pt called daughter and notified her she was going to surgery. Consent not signed- OR aware and stated they would get down there. Pt A&O, VSS.

## 2018-07-26 NOTE — Progress Notes (Signed)
PROGRESS NOTE    Stacey Herman  XTG:[id number] DOB: 08-16-1956 DOA: 07/25/2018 PCP: Rogers Blocker, MD    Brief Narrative:   62 y.o. female with medical history significant for COPD, now presenting to the emergency department for evaluation of severe left flank pain and nausea.  Patient reports that symptoms developed approximately 1 week ago, have been persistent, severe, sharp, aching, and with some radiation around into the left groin.  She denies fevers, chills, dysuria, or gross hematuria.  Denies any recent increase in her chronic cough or dyspnea, denies chest pain or palpitations, and denies any melena or hematochezia.  ED Course: Upon arrival to the ED, patient is found to be afebrile, saturating well on room air, and with vitals otherwise normal.  Chemistry panel and CBC are unremarkable and urinalysis is notable for moderate hemoglobin and large leukocytes.  CT abdomen and pelvis is notable for at least 4 stones in the left ureter, largest measuring 12 mm, with moderate left hydroureteronephrosis, as well as several nonobstructing stones bilaterally.  Urology was consulted by the ED physician and recommends a medical admission was a long hospital for ongoing evaluation and management  Assessment & Plan:   Principal Problem:   Hydronephrosis with renal and ureteral calculous obstruction Active Problems:   COPD (chronic obstructive pulmonary disease) ([place])  1. Renal and ureteral calculi with right hydroureteronephrosis  - Presents with severe left flank pain x1 week with nausea  - CT reveals at least 4 stones in left ureter, largest measuring 12 mm, with moderate left hydroureteronephrosis  - Urology consulted -Pt now s/p ureteral stent placement 9/29. Reports some post-op discomfort - Urine cx pending. On meropenem presently. Per Urology possible transition to keflex on discharge   - Continue supportive care, empiric meropenem   2. COPD  - No wheezing or SOB  - Continue  as-needed nebs   -on minimal O2 support  3. Weakness -ambulates with walker at baseline -Will consult PT  DVT prophylaxis: SCD's Code Status: Full Family Communication: Pt in room, family not at bedside Disposition Plan: Possible d/c home in 24hrs  Consultants:   Urology  Procedures:   Ureteral stent placement 9/29  Antimicrobials: Anti-infectives (From admission, onward)   Start     Dose/Rate Route Frequency Ordered Stop   07/25/18 2330  meropenem (MERREM) 1 g in sodium chloride 0.9 % 100 mL IVPB     1 g 200 mL/hr over 30 Minutes Intravenous Every 8 hours 07/25/18 2325     07/25/18 2315  ciprofloxacin (CIPRO) IVPB 400 mg  Status:  Discontinued     400 mg 200 mL/hr over 60 Minutes Intravenous 2 times daily 07/25/18 2313 07/25/18 2315       Subjective: Complaining of flank soreness s/p ureteral stent placeement  Objective: Vitals:   07/26/18 0945 07/26/18 1000 07/26/18 1020 07/26/18 1245  BP: 113/75 120/69 116/82 107/76  Pulse: 84 84 78 92  Resp: 12 12 18 16   Temp:  97.9 F (36.6 C) (!) 97.5 F (36.4 C) 98 F (36.7 C)  TempSrc:   Oral Oral  SpO2: 96% 99% 95% 92%  Weight:      Height:        Intake/Output Summary (Last 24 hours) at 07/26/2018 1519 Last data filed at 07/26/2018 1500 Gross per 24 hour  Intake 1102.49 ml  Output 855 ml  Net 247.49 ml   Filed Weights   07/25/18 1419 07/25/18 2250  Weight: 82.1 kg 87.1 kg    Examination:  General exam: Appears calm and comfortable  Respiratory system: Clear to auscultation. Respiratory effort normal. Cardiovascular system: S1 & S2 heard, RRR. Gastrointestinal system: Abdomen is nondistended, soft and nontender. No organomegaly or masses felt. Normal bowel sounds heard. Central nervous system: Alert and oriented. No focal neurological deficits. Extremities: Symmetric 5 x 5 power. Skin: No rashes, lesions Psychiatry: Judgement and insight appear normal. Mood & affect appropriate.   Data Reviewed: I  have personally reviewed following labs and imaging studies  CBC: Recent Labs  Lab 07/25/18 1451 07/26/18 0545  WBC 6.2 5.9  HGB 14.4 14.2  HCT 43.3 43.5  MCV 97.7 98.0  PLT 187 924   Basic Metabolic Panel: Recent Labs  Lab 07/25/18 1451 07/26/18 0545  NA 135 140  K 3.8 4.3  CL 100 102  CO2 30 33*  GLUCOSE 87 93  BUN 7 9  CREATININE 0.75 0.70  CALCIUM 8.9 8.9   GFR: Estimated Creatinine Clearance: 89.6 mL/min (by C-G formula based on SCr of 0.7 mg/dL). Liver Function Tests: Recent Labs  Lab 07/25/18 1451  AST 14*  ALT 12  ALKPHOS 81  BILITOT 0.7  PROT 6.0*  ALBUMIN 3.7   Recent Labs  Lab 07/25/18 1451  LIPASE 28   No results for input(s): AMMONIA in the last 168 hours. Coagulation Profile: No results for input(s): INR, PROTIME in the last 168 hours. Cardiac Enzymes: No results for input(s): CKTOTAL, CKMB, CKMBINDEX, TROPONINI in the last 168 hours. BNP (last 3 results) No results for input(s): PROBNP in the last 8760 hours. HbA1C: No results for input(s): HGBA1C in the last 72 hours. CBG: Recent Labs  Lab 07/26/18 0613  GLUCAP 83   Lipid Profile: No results for input(s): CHOL, HDL, LDLCALC, TRIG, CHOLHDL, LDLDIRECT in the last 72 hours. Thyroid Function Tests: No results for input(s): TSH, T4TOTAL, FREET4, T3FREE, THYROIDAB in the last 72 hours. Anemia Panel: No results for input(s): VITAMINB12, FOLATE, FERRITIN, TIBC, IRON, RETICCTPCT in the last 72 hours. Sepsis Labs: No results for input(s): PROCALCITON, LATICACIDVEN in the last 168 hours.  Recent Results (from the past 240 hour(s))  Surgical pcr screen     Status: None   Collection Time: 07/26/18  7:59 AM  Result Value Ref Range Status   MRSA, PCR NEGATIVE NEGATIVE Final   Staphylococcus aureus NEGATIVE NEGATIVE Final    Comment: (NOTE) The Xpert SA Assay (FDA approved for NASAL specimens in patients 45 years of age and older), is one component of a comprehensive surveillance program.  It is not intended to diagnose infection nor to guide or monitor treatment. Performed at [facility], [place] [address], [place], [place] [address]      Radiology Studies: Ct Abdomen Pelvis W Contrast  Result Date: 07/25/2018 CLINICAL DATA:  Lower abdominal pain, nausea. Prior appendectomy and bilateral tubal ligation. EXAM: CT ABDOMEN AND PELVIS WITH CONTRAST TECHNIQUE: Multidetector CT imaging of the abdomen and pelvis was performed using the standard protocol following bolus administration of intravenous contrast. CONTRAST:  153mL OMNIPAQUE IOHEXOL 300 MG/ML  SOLN COMPARISON:  05/16/2017 CT abdomen/pelvis. FINDINGS: Lower chest: No significant pulmonary nodules or acute consolidative airspace disease. Hepatobiliary: Normal size liver. Granulomatous anterior liver calcification. No liver mass. Normal gallbladder with no radiopaque cholelithiasis. No biliary ductal dilatation. Pancreas: Normal, with no mass or duct dilation. Spleen: Normal size. No mass. Adrenals/Urinary Tract: Normal adrenals. There are a total of 4 stones in the left lumbar ureter, 3 of which are clustered in the lower left lumbar  ureter measuring up to 12 mm, and 1 of which is located in the proximal left lumbar ureter measuring 7 mm. Moderate left hydroureteronephrosis. No right hydronephrosis. Several nonobstructing right renal stones, largest 5 mm in the upper right kidney. Several nonobstructing stones throughout the left kidney, largest 5 mm in the lower left kidney. No renal masses. Bladder obscured by streak artifact from left hip hardware. Bladder appears normal. Stomach/Bowel: Normal non-distended stomach. Normal caliber small bowel with no small bowel wall thickening. Appendectomy. Normal large bowel with no diverticulosis, large bowel wall thickening or pericolonic fat stranding. Vascular/Lymphatic: Atherosclerotic nonaneurysmal abdominal aorta. Patent portal, splenic, hepatic and renal veins. No  pathologically enlarged lymph nodes in the abdomen or pelvis. Reproductive: Grossly normal uterus with no appreciable adnexal mass, noting limited visualization of the pelvis due to streak artifact from left hip hardware. Other: No pneumoperitoneum, ascites or focal fluid collection. Musculoskeletal: No aggressive appearing focal osseous lesions. Left total hip arthroplasty. Marked lumbar spondylosis. IMPRESSION: 1. At least 4 stones in the lumbar left ureter, largest 12 mm in the lower left lumbar ureter, with moderate left hydroureteronephrosis. 2. Several nonobstructing stones in both kidneys. 3.  Aortic Atherosclerosis (ICD10-I70.0). Electronically Signed   By: Ilona Sorrel M.D.   On: 07/25/2018 17:57   Dg C-arm 1-60 Min-no Report  Result Date: 07/26/2018 Fluoroscopy was utilized by the requesting physician.  No radiographic interpretation.    Scheduled Meds: . multivitamin with minerals  1 tablet Oral Daily   Continuous Infusions: . sodium chloride Stopped (07/26/18 1438)  . meropenem (MERREM) IV 200 mL/hr at 07/26/18 1500     LOS: 1 day   Marylu Lund, MD [facility] Pager On Amion  If 7PM-7AM, please contact night-coverage 07/26/2018, 3:19 PM

## 2018-07-27 ENCOUNTER — Encounter (HOSPITAL_COMMUNITY): Payer: Self-pay | Admitting: Urology

## 2018-07-27 DIAGNOSIS — N132 Hydronephrosis with renal and ureteral calculous obstruction: Secondary | ICD-10-CM | POA: Diagnosis not present

## 2018-07-27 DIAGNOSIS — R109 Unspecified abdominal pain: Secondary | ICD-10-CM | POA: Diagnosis not present

## 2018-07-27 LAB — URINE CULTURE: CULTURE: NO GROWTH

## 2018-07-27 LAB — GLUCOSE, CAPILLARY: Glucose-Capillary: 116 mg/dL — ABNORMAL HIGH (ref 70–99)

## 2018-07-27 MED ORDER — OXYCODONE HCL 5 MG PO TABS
5.0000 mg | ORAL_TABLET | Freq: Three times a day (TID) | ORAL | Status: DC
  Administered 2018-07-27: 5 mg via ORAL
  Filled 2018-07-27: qty 1

## 2018-07-27 MED ORDER — NITROFURANTOIN MONOHYD MACRO 100 MG PO CAPS: 100.0000 mg | ORAL_CAPSULE | Freq: Two times a day (BID) | ORAL | 0 refills | Status: AC

## 2018-07-27 MED ORDER — OXYCODONE-ACETAMINOPHEN 10-325 MG PO TABS: 1.0000 | ORAL_TABLET | Freq: Three times a day (TID) | ORAL | Status: DC

## 2018-07-27 MED ORDER — OXYCODONE-ACETAMINOPHEN 5-325 MG PO TABS
1.0000 | ORAL_TABLET | Freq: Three times a day (TID) | ORAL | Status: DC
  Administered 2018-07-27: 1 via ORAL
  Filled 2018-07-27: qty 1

## 2018-07-27 NOTE — Discharge Summary (Signed)
Physician Discharge Summary  Arrielle Mcginn XBD:[id number] DOB: 23-Jul-1956 DOA: 07/25/2018  PCP: Rogers Blocker, MD  Admit date: 07/25/2018 Discharge date: 07/27/2018  Admitted From: Home Disposition:  Home  Recommendations for Outpatient Follow-up:  1. Follow up with PCP in 2-3 weeks 2. Follow up with Urology as scheduled   Discharge Condition:Stable CODE STATUS:Full Diet recommendation: Regular   Brief/Interim Summary: 62 y.o.femalewith medical history significant forCOPD, now presenting to the emergency department for evaluation of severe left flank pain and nausea. Patient reports that symptoms developed approximately 1 week ago, have been persistent, severe, sharp, aching, and with some radiation around into the left groin. She denies fevers, chills, dysuria, or gross hematuria. Denies any recentincrease in her chronic cough or dyspnea, denies chest pain or palpitations, and denies any melena or hematochezia.  ED Course:Upon arrival to the ED, patient is found to be afebrile, saturating well on room air, and with vitals otherwise normal. Chemistry panel and CBC are unremarkable and urinalysis is notable for moderate hemoglobin and large leukocytes. CT abdomen and pelvis is notable for at least 4 stones in the left ureter, largest measuring 12 mm, with moderate left hydroureteronephrosis,as well as several nonobstructing stones bilaterally. Urology was consulted by the ED physician and recommends a medical admission was a long hospital for ongoing evaluation and management  Discharge Diagnoses:  Principal Problem:   Hydronephrosis with renal and ureteral calculous obstruction Active Problems:   COPD (chronic obstructive pulmonary disease) ([place])  1.Renal and ureteral calculi with right hydroureteronephrosis -Presents with severe left flank pain x1 week with nausea  - CT reveals at least 4 stones in left ureter, largest measuring 12 mm, with moderate left  hydroureteronephrosis -Urology consulted -Pt now s/p ureteral stent placement 9/29. Reports some post-op discomfort -Urine cx pending. On meropenem presently. Per Urology possible transition to macrobid on dischargex 7 days  2.COPD -No wheezing or SOB -Continue as-needed nebs -on minimal O2 support  3. Weakness -ambulates with walker at baseline -Consulted PT with no PT needs   Discharge Instructions   Allergies as of 07/27/2018      Reactions   Amoxicillin Anaphylaxis, Hives   Aspirin Anaphylaxis, Hives   Benadryl [diphenhydramine] Anaphylaxis, Hives   Ciprocin-fluocin-procin [fluocinolone] Anaphylaxis, Hives   Ciprofloxacin Anaphylaxis   Morphine And Related Anaphylaxis, Hives   Mushroom Extract Complex Anaphylaxis, Hives   Penicillins Anaphylaxis, Hives   Unknown Has patient had a PCN reaction causing immediate rash, facial/tongue/throat swelling, SOB or lightheadedness with hypotension: Yes Has patient had a PCN reaction causing severe rash involving mucus membranes or skin necrosis: Yes Has patient had a PCN reaction that required hospitalization: Yes Has patient had a PCN reaction occurring within the last 10 years: Yes If all of the above answers are "NO", then may proceed with Cephalosporin use.   Sulfa Antibiotics Anaphylaxis, Hives      Medication List    TAKE these medications   acetaminophen 325 MG tablet Commonly known as:  TYLENOL Take 325-650 mg by mouth every 6 (six) hours as needed (for headaches or pain).   albuterol (2.5 MG/3ML) 0.083% nebulizer solution Commonly known as:  PROVENTIL Take 3 mLs (2.5 mg total) by nebulization every 6 (six) hours as needed for wheezing.   cyclobenzaprine 5 MG tablet Commonly known as:  FLEXERIL Take 5 mg by mouth 3 (three) times daily as needed for muscle spasms.   ipratropium-albuterol 0.5-2.5 (3) MG/3ML Soln Commonly known as:  DUONEB Take 3 mLs by nebulization every 6 (six) hours  as needed.    multivitamin with minerals Tabs tablet Take 1 tablet by mouth daily.   nicotine 21 mg/24hr patch Commonly known as:  NICODERM CQ - dosed in mg/24 hours Place 1 patch (21 mg total) onto the skin daily.   oxyCODONE-acetaminophen 10-325 MG tablet Commonly known as:  PERCOCET Take 1 tablet by mouth every 6 (six) hours as needed for pain. What changed:  when to take this   traZODone 50 MG tablet Commonly known as:  DESYREL Take 1 tablet (50 mg total) by mouth at bedtime as needed for sleep.      Follow-up Information    Rogers Blocker, MD. Schedule an appointment as soon as possible for a visit in 2 week(s).   Specialty:  Internal Medicine Contact information: [place] [address] [phone]        Lucas Mallow, MD. Schedule an appointment as soon as possible for a visit in 2 week(s).   Specialty:  Urology Contact information: [place] [phone] [phone]          Allergies  Allergen Reactions  . Amoxicillin Anaphylaxis and Hives  . Aspirin Anaphylaxis and Hives  . Benadryl [Diphenhydramine] Anaphylaxis and Hives  . Ciprocin-Fluocin-Procin [Fluocinolone] Anaphylaxis and Hives  . Ciprofloxacin Anaphylaxis  . Morphine And Related Anaphylaxis and Hives  . Mushroom Extract Complex Anaphylaxis and Hives  . Penicillins Anaphylaxis and Hives    Unknown Has patient had a PCN reaction causing immediate rash, facial/tongue/throat swelling, SOB or lightheadedness with hypotension: Yes Has patient had a PCN reaction causing severe rash involving mucus membranes or skin necrosis: Yes Has patient had a PCN reaction that required hospitalization: Yes Has patient had a PCN reaction occurring within the last 10 years: Yes If all of the above answers are "NO", then may proceed with Cephalosporin use.   . Sulfa Antibiotics Anaphylaxis and Hives    Consultations:  Urology  Procedures/Studies: Ct Abdomen Pelvis W  Contrast  Result Date: 07/25/2018 CLINICAL DATA:  Lower abdominal pain, nausea. Prior appendectomy and bilateral tubal ligation. EXAM: CT ABDOMEN AND PELVIS WITH CONTRAST TECHNIQUE: Multidetector CT imaging of the abdomen and pelvis was performed using the standard protocol following bolus administration of intravenous contrast. CONTRAST:  170mL OMNIPAQUE IOHEXOL 300 MG/ML  SOLN COMPARISON:  05/16/2017 CT abdomen/pelvis. FINDINGS: Lower chest: No significant pulmonary nodules or acute consolidative airspace disease. Hepatobiliary: Normal size liver. Granulomatous anterior liver calcification. No liver mass. Normal gallbladder with no radiopaque cholelithiasis. No biliary ductal dilatation. Pancreas: Normal, with no mass or duct dilation. Spleen: Normal size. No mass. Adrenals/Urinary Tract: Normal adrenals. There are a total of 4 stones in the left lumbar ureter, 3 of which are clustered in the lower left lumbar ureter measuring up to 12 mm, and 1 of which is located in the proximal left lumbar ureter measuring 7 mm. Moderate left hydroureteronephrosis. No right hydronephrosis. Several nonobstructing right renal stones, largest 5 mm in the upper right kidney. Several nonobstructing stones throughout the left kidney, largest 5 mm in the lower left kidney. No renal masses. Bladder obscured by streak artifact from left hip hardware. Bladder appears normal. Stomach/Bowel: Normal non-distended stomach. Normal caliber small bowel with no small bowel wall thickening. Appendectomy. Normal large bowel with no diverticulosis, large bowel wall thickening or pericolonic fat stranding. Vascular/Lymphatic: Atherosclerotic nonaneurysmal abdominal aorta. Patent portal, splenic, hepatic and renal veins. No pathologically enlarged lymph nodes in the abdomen or pelvis. Reproductive: Grossly normal uterus  with no appreciable adnexal mass, noting limited visualization of the pelvis due to streak artifact from left hip hardware. Other:  No pneumoperitoneum, ascites or focal fluid collection. Musculoskeletal: No aggressive appearing focal osseous lesions. Left total hip arthroplasty. Marked lumbar spondylosis. IMPRESSION: 1. At least 4 stones in the lumbar left ureter, largest 12 mm in the lower left lumbar ureter, with moderate left hydroureteronephrosis. 2. Several nonobstructing stones in both kidneys. 3.  Aortic Atherosclerosis (ICD10-I70.0). Electronically Signed   By: Ilona Sorrel M.D.   On: 07/25/2018 17:57   Dg C-arm 1-60 Min-no Report  Result Date: 07/26/2018 Fluoroscopy was utilized by the requesting physician.  No radiographic interpretation.     Subjective: Eager to go home  Discharge Exam: Vitals:   07/26/18 2111 07/27/18 0442  BP: (!) 103/56 (!) 111/57  Pulse: 78 66  Resp: 17 16  Temp: 97.9 F (36.6 C) 97.9 F (36.6 C)  SpO2: 96% 93%   Vitals:   07/26/18 1020 07/26/18 1245 07/26/18 2111 07/27/18 0442  BP: 116/82 107/76 (!) 103/56 (!) 111/57  Pulse: 78 92 78 66  Resp: 18 16 17 16   Temp: (!) 97.5 F (36.4 C) 98 F (36.7 C) 97.9 F (36.6 C) 97.9 F (36.6 C)  TempSrc: Oral Oral Oral Oral  SpO2: 95% 92% 96% 93%  Weight:      Height:        General: Pt is alert, awake, not in acute distress Cardiovascular: RRR, S1/S2 +, no rubs, no gallops Respiratory: CTA bilaterally, no wheezing, no rhonchi Abdominal: Soft, NT, ND, bowel sounds + Extremities: no edema, no cyanosis   The results of significant diagnostics from this hospitalization (including imaging, microbiology, ancillary and laboratory) are listed below for reference.     Microbiology: Recent Results (from the past 240 hour(s))  Culture, Urine     Status: Abnormal   Collection Time: 07/25/18  9:22 PM  Result Value Ref Range Status   Specimen Description URINE, RANDOM  Final   Special Requests   Final    NONE Performed at [facility], [address], [place], [place] [address]    Culture MULTIPLE SPECIES PRESENT, SUGGEST  RECOLLECTION (A)  Final   Report Status 07/27/2018 FINAL  Final  Surgical pcr screen     Status: None   Collection Time: 07/26/18  7:59 AM  Result Value Ref Range Status   MRSA, PCR NEGATIVE NEGATIVE Final   Staphylococcus aureus NEGATIVE NEGATIVE Final    Comment: (NOTE) The Xpert SA Assay (FDA approved for NASAL specimens in patients 19 years of age and older), is one component of a comprehensive surveillance program. It is not intended to diagnose infection nor to guide or monitor treatment. Performed at [facility], [place] [address], [place], [place] [address]   Culture, Urine     Status: None   Collection Time: 07/26/18  8:09 AM  Result Value Ref Range Status   Specimen Description   Final    URINE, RANDOM Performed at [facility] [address] [place], [place] [address]    Special Requests   Final    NONE Performed at [facility], [place] [address] [place], [place] [address]    Culture   Final    NO GROWTH Performed at [facility], [place] [address] [place], [place] [address]    Report Status 07/27/2018 FINAL  Final     Labs: BNP (last 3 results) Recent Labs  08/23/17 1440  BNP 44.8   Basic Metabolic Panel: Recent Labs  Lab 07/25/18 1451 07/26/18 0545  NA 135 140  K 3.8 4.3  CL 100 102  CO2 30 33*  GLUCOSE 87 93  BUN 7 9  CREATININE 0.75 0.70  CALCIUM 8.9 8.9   Liver Function Tests: Recent Labs  Lab 07/25/18 1451  AST 14*  ALT 12  ALKPHOS 81  BILITOT 0.7  PROT 6.0*  ALBUMIN 3.7   Recent Labs  Lab 07/25/18 1451  LIPASE 28   No results for input(s): AMMONIA in the last 168 hours. CBC: Recent Labs  Lab 07/25/18 1451 07/26/18 0545  WBC 6.2 5.9  HGB 14.4 14.2  HCT 43.3 43.5  MCV 97.7 98.0  PLT 187 195   Cardiac Enzymes: No results for input(s): CKTOTAL, CKMB, CKMBINDEX, TROPONINI in the last 168 hours. BNP: Invalid input(s): POCBNP CBG: Recent Labs  Lab  07/26/18 0613 07/27/18 0735  GLUCAP 83 116*   D-Dimer No results for input(s): DDIMER in the last 72 hours. Hgb A1c No results for input(s): HGBA1C in the last 72 hours. Lipid Profile No results for input(s): CHOL, HDL, LDLCALC, TRIG, CHOLHDL, LDLDIRECT in the last 72 hours. Thyroid function studies No results for input(s): TSH, T4TOTAL, T3FREE, THYROIDAB in the last 72 hours.  Invalid input(s): FREET3 Anemia work up No results for input(s): VITAMINB12, FOLATE, FERRITIN, TIBC, IRON, RETICCTPCT in the last 72 hours. Urinalysis    Component Value Date/Time   COLORURINE YELLOW 07/25/2018 1418   APPEARANCEUR HAZY (A) 07/25/2018 1418   LABSPEC 1.009 07/25/2018 1418   PHURINE 6.0 07/25/2018 1418   GLUCOSEU NEGATIVE 07/25/2018 1418   HGBUR MODERATE (A) 07/25/2018 1418   BILIRUBINUR NEGATIVE 07/25/2018 1418   KETONESUR NEGATIVE 07/25/2018 1418   PROTEINUR NEGATIVE 07/25/2018 1418   NITRITE NEGATIVE 07/25/2018 1418   LEUKOCYTESUR LARGE (A) 07/25/2018 1418   Sepsis Labs Invalid input(s): PROCALCITONIN,  WBC,  LACTICIDVEN Microbiology Recent Results (from the past 240 hour(s))  Culture, Urine     Status: Abnormal   Collection Time: 07/25/18  9:22 PM  Result Value Ref Range Status   Specimen Description URINE, RANDOM  Final   Special Requests   Final    NONE Performed at [facility], [place] [address], [place], [place] [address]    Culture MULTIPLE SPECIES PRESENT, SUGGEST RECOLLECTION (A)  Final   Report Status 07/27/2018 FINAL  Final  Surgical pcr screen     Status: None   Collection Time: 07/26/18  7:59 AM  Result Value Ref Range Status   MRSA, PCR NEGATIVE NEGATIVE Final   Staphylococcus aureus NEGATIVE NEGATIVE Final    Comment: (NOTE) The Xpert SA Assay (FDA approved for NASAL specimens in patients 63 years of age and older), is one component of a comprehensive surveillance program. It is not intended to diagnose infection nor to guide or monitor  treatment. Performed at [facility], [place] [address] [place], [place] [address]   Culture, Urine     Status: None   Collection Time: 07/26/18  8:09 AM  Result Value Ref Range Status   Specimen Description   Final    URINE, RANDOM Performed at [place] [address] [place], [place] [address]    Special Requests   Final    NONE Performed at [facility], [place] [address], [place], [place] [address]    Culture   Final    NO GROWTH Performed at [facility]  [facility], [place] [address], [place], [place] [address]    Report Status 07/27/2018 FINAL  Final   Time spent: 30 min  SIGNED:   Marylu Lund, MD  [facility] 07/27/2018, 12:11 PM  If 7PM-7AM, please contact night-coverage

## 2018-07-27 NOTE — Progress Notes (Signed)
Urology Inpatient Progress Report  stomach pain  Procedure(s): CYSTOSCOPY, LEFT RETROGRADE, WITH LEFT URETERAL STENT PLACEMENT  1 Day Post-Op   Intv/Subj: No acute events overnight. Patient is without complaint. Filling much improved.  Occasional flank pain consistent with stent colic.  Principal Problem:   Hydronephrosis with renal and ureteral calculous obstruction Active Problems:   COPD (chronic obstructive pulmonary disease) ([facility])  Current Facility-Administered Medications  Medication Dose Route Frequency Provider Last Rate Last Dose  . 0.9 %  sodium chloride infusion   Intravenous PRN Donne Hazel, MD   Stopped at 07/26/18 1438  . acetaminophen (TYLENOL) tablet 650 mg  650 mg Oral Q6H PRN Opyd, Ilene Qua, MD   650 mg at 07/26/18 0544   Or  . acetaminophen (TYLENOL) suppository 650 mg  650 mg Rectal Q6H PRN Opyd, Ilene Qua, MD      . albuterol (PROVENTIL) (2.5 MG/3ML) 0.083% nebulizer solution 2.5 mg  2.5 mg Nebulization Q6H PRN Opyd, Ilene Qua, MD      . cyclobenzaprine (FLEXERIL) tablet 5 mg  5 mg Oral TID PRN Opyd, Ilene Qua, MD      . HYDROmorphone (DILAUDID) injection 0.5-1 mg  0.5-1 mg Intravenous Q4H PRN Opyd, Ilene Qua, MD   1 mg at 07/27/18 0927  . meropenem (MERREM) 1 g in sodium chloride 0.9 % 100 mL IVPB  1 g Intravenous Q8H Opyd, Ilene Qua, MD 200 mL/hr at 07/27/18 0533 1 g at 07/27/18 0533  . multivitamin with minerals tablet 1 tablet  1 tablet Oral Daily Opyd, Ilene Qua, MD   1 tablet at 07/27/18 6578  . ondansetron (ZOFRAN) injection 4 mg  4 mg Intravenous Q6H PRN Opyd, Ilene Qua, MD      . oxyCODONE-acetaminophen (PERCOCET/ROXICET) 5-325 MG per tablet 1 tablet  1 tablet Oral TID Donne Hazel, MD   1 tablet at 07/27/18 1014   And  . oxyCODONE (Oxy IR/ROXICODONE) immediate release tablet 5 mg  5 mg Oral TID Donne Hazel, MD   5 mg at 07/27/18 1014  . senna-docusate (Senokot-S) tablet 1 tablet  1 tablet Oral QHS PRN Vianne Bulls, MD          Objective: Vital: Vitals:   07/26/18 1020 07/26/18 1245 07/26/18 2111 07/27/18 0442  BP: 116/82 107/76 (!) 103/56 (!) 111/57  Pulse: 78 92 78 66  Resp: 18 16 17 16   Temp: (!) 97.5 F (36.4 C) 98 F (36.7 C) 97.9 F (36.6 C) 97.9 F (36.6 C)  TempSrc: Oral Oral Oral Oral  SpO2: 95% 92% 96% 93%  Weight:      Height:       I/Os: I/O last 3 completed shifts: In: 1915.6 [P.O.:590; I.V.:700.1; IV Piggyback:625.5] Out: 2630 [Urine:2625; Blood:5]  Physical Exam:  General: Patient is in no apparent distress Lungs: Normal respiratory effort, chest expands symmetrically. GI: The abdomen is soft and nontender without mass. Ext: lower extremities symmetric  Lab Results: Recent Labs    07/25/18 1451 07/26/18 0545  WBC 6.2 5.9  HGB 14.4 14.2  HCT 43.3 43.5   Recent Labs    07/25/18 1451 07/26/18 0545  NA 135 140  K 3.8 4.3  CL 100 102  CO2 30 33*  GLUCOSE 87 93  BUN 7 9  CREATININE 0.75 0.70  CALCIUM 8.9 8.9   No results for input(s): LABPT, INR in the last 72 hours. No results for input(s): LABURIN in the last 72 hours. Results for orders placed or performed during  the hospital encounter of 07/25/18  Culture, Urine     Status: Abnormal   Collection Time: 07/25/18  9:22 PM  Result Value Ref Range Status   Specimen Description URINE, RANDOM  Final   Special Requests   Final    NONE Performed at [facility], [address], [place], [place] [address]    Culture MULTIPLE SPECIES PRESENT, SUGGEST RECOLLECTION (A)  Final   Report Status 07/27/2018 FINAL  Final  Surgical pcr screen     Status: None   Collection Time: 07/26/18  7:59 AM  Result Value Ref Range Status   MRSA, PCR NEGATIVE NEGATIVE Final   Staphylococcus aureus NEGATIVE NEGATIVE Final    Comment: (NOTE) The Xpert SA Assay (FDA approved for NASAL specimens in patients 69 years of age and older), is one component of a comprehensive surveillance program. It is not intended to diagnose  infection nor to guide or monitor treatment. Performed at [facility], [place] [address], [place], [place] [address]   Culture, Urine     Status: None   Collection Time: 07/26/18  8:09 AM  Result Value Ref Range Status   Specimen Description   Final    URINE, RANDOM Performed at [facility] [address], [place], [place] [address]    Special Requests   Final    NONE Performed at [facility], [place] [address] [place], [place] [address]    Culture   Final    NO GROWTH Performed at [facility], [place] [address] [place],  [address]    Report Status 07/27/2018 FINAL  Final    Studies/Results: Ct Abdomen Pelvis W Contrast  Result Date: 07/25/2018 CLINICAL DATA:  Lower abdominal pain, nausea. Prior appendectomy and bilateral tubal ligation. EXAM: CT ABDOMEN AND PELVIS WITH CONTRAST TECHNIQUE: Multidetector CT imaging of the abdomen and pelvis was performed using the standard protocol following bolus administration of intravenous contrast. CONTRAST:  160mL OMNIPAQUE IOHEXOL 300 MG/ML  SOLN COMPARISON:  05/16/2017 CT abdomen/pelvis. FINDINGS: Lower chest: No significant pulmonary nodules or acute consolidative airspace disease. Hepatobiliary: Normal size liver. Granulomatous anterior liver calcification. No liver mass. Normal gallbladder with no radiopaque cholelithiasis. No biliary ductal dilatation. Pancreas: Normal, with no mass or duct dilation. Spleen: Normal size. No mass. Adrenals/Urinary Tract: Normal adrenals. There are a total of 4 stones in the left lumbar ureter, 3 of which are clustered in the lower left lumbar ureter measuring up to 12 mm, and 1 of which is located in the proximal left lumbar ureter measuring 7 mm. Moderate left hydroureteronephrosis. No right hydronephrosis. Several nonobstructing right renal stones, largest 5 mm in the upper right kidney. Several nonobstructing stones throughout the left kidney,  largest 5 mm in the lower left kidney. No renal masses. Bladder obscured by streak artifact from left hip hardware. Bladder appears normal. Stomach/Bowel: Normal non-distended stomach. Normal caliber small bowel with no small bowel wall thickening. Appendectomy. Normal large bowel with no diverticulosis, large bowel wall thickening or pericolonic fat stranding. Vascular/Lymphatic: Atherosclerotic nonaneurysmal abdominal aorta. Patent portal, splenic, hepatic and renal veins. No pathologically enlarged lymph nodes in the abdomen or pelvis. Reproductive: Grossly normal uterus with no appreciable adnexal mass, noting limited visualization of the pelvis due to streak artifact from left hip hardware. Other: No pneumoperitoneum, ascites or focal fluid collection. Musculoskeletal: No aggressive appearing focal osseous lesions. Left total hip arthroplasty. Marked lumbar spondylosis. IMPRESSION: 1. At least 4 stones in the lumbar left  ureter, largest 12 mm in the lower left lumbar ureter, with moderate left hydroureteronephrosis. 2. Several nonobstructing stones in both kidneys. 3.  Aortic Atherosclerosis (ICD10-I70.0). Electronically Signed   By: Ilona Sorrel M.D.   On: 07/25/2018 17:57   Dg C-arm 1-60 Min-no Report  Result Date: 07/26/2018 Fluoroscopy was utilized by the requesting physician.  No radiographic interpretation.    Assessment: Left ureteral calculi Flank pain  Procedure(s): CYSTOSCOPY, LEFT RETROGRADE, WITH LEFT URETERAL STENT PLACEMENT, 1 Day Post-Op  doing well.  Plan: She needs to be set up for left retrograde pyelogram, left ureteroscopy with laser lithotripsy, ureteral stent placement.  I will arrange for this. Recommend sending home with 7 days of antibiotics such as Macrobid given her multiple allergies   Link Snuffer, MD Urology 07/27/2018, 12:40 PM

## 2018-07-27 NOTE — Care Management Note (Signed)
Case Management Note  Patient Details  Name: Stacey Herman MRN: [id number] Date of Birth: [birth year]  Subjective/Objective:No CM needs.                    Action/Plan:dc home.   Expected Discharge Date:  07/27/18               Expected Discharge Plan:  Home/Self Care  In-House Referral:     Discharge planning Services  CM Consult  Post Acute Care Choice:    Choice offered to:     DME Arranged:    DME Agency:     HH Arranged:    HH Agency:     Status of Service:  Completed, signed off  If discussed at [employer] of Stay Meetings, dates discussed:    Additional Comments:  Dessa Phi, RN 07/27/2018, 12:34 PM

## 2018-07-27 NOTE — Evaluation (Signed)
Physical Therapy Evaluation-1x Patient Details Name: Shila Kruczek MRN: [id number] DOB: 1956/04/17 Today's Date: 07/27/2018   History of Present Illness  62 yo female s/p cystoscopy, L ureteral stent placement 07/24/18  Clinical Impression  Pt was Mod Ind with mobility. No acute PT needs. 1x eval.     Follow Up Recommendations No PT follow up    Equipment Recommendations  None recommended by PT    Recommendations for Other Services       Precautions / Restrictions Precautions Precautions: None Restrictions Weight Bearing Restrictions: No      Mobility  Bed Mobility Overal bed mobility: Modified Independent                Transfers Overall transfer level: Modified independent                  Ambulation/Gait Ambulation/Gait assistance: Modified independent (Device/Increase time) Gait Distance (Feet): 200 Feet Assistive device: 4-wheeled walker Gait Pattern/deviations: Step-through pattern        Stairs            Wheelchair Mobility    Modified Rankin (Stroke Patients Only)       Balance Overall balance assessment: Mild deficits observed, not formally tested                                           Pertinent Vitals/Pain Pain Assessment: 0-10 Pain Score: 7  Pain Descriptors / Indicators: Sore    Home Living Family/patient expects to be discharged to:: Private residence Living Arrangements: Children Available Help at Discharge: Family Type of Home: House Home Access: Stairs to enter Entrance Stairs-Rails: Chemical engineer of Steps: 4 Home Layout: Two level;Bed/bath upstairs Home Equipment: Environmental consultant - 4 wheels      Prior Function Level of Independence: Independent with assistive device(s)         Comments: uses rollator     Hand Dominance        Extremity/Trunk Assessment   Upper Extremity Assessment Upper Extremity Assessment: Overall WFL for tasks assessed    Lower  Extremity Assessment Lower Extremity Assessment: Generalized weakness    Cervical / Trunk Assessment Cervical / Trunk Assessment: Normal  Communication   Communication: No difficulties  Cognition Arousal/Alertness: Awake/alert Behavior During Therapy: WFL for tasks assessed/performed Overall Cognitive Status: Within Functional Limits for tasks assessed                                        General Comments      Exercises     Assessment/Plan    PT Assessment Patent does not need any further PT services  PT Problem List         PT Treatment Interventions      PT Goals (Current goals can be found in the Care Plan section)  Acute Rehab PT Goals Patient Stated Goal: home PT Goal Formulation: All assessment and education complete, DC therapy    Frequency     Barriers to discharge        Co-evaluation               AM-PAC PT "6 Clicks" Daily Activity  Outcome Measure Difficulty turning over in bed (including adjusting bedclothes, sheets and blankets)?: None Difficulty moving from lying on back to sitting on the side  of the bed? : None Difficulty sitting down on and standing up from a chair with arms (e.g., wheelchair, bedside commode, etc,.)?: None Help needed moving to and from a bed to chair (including a wheelchair)?: None Help needed walking in hospital room?: None Help needed climbing 3-5 steps with a railing? : A Little 6 Click Score: 23    End of Session   Activity Tolerance: Patient tolerated treatment well Patient left: in bed;with call bell/phone within reach        Time: 1027-1045 PT Time Calculation (min) (ACUTE ONLY): 18 min   Charges:   PT Evaluation $PT Eval Low Complexity: [place], PT Acute Rehabilitation Services Pager: [phone] Office: [phone]

## 2018-07-30 ENCOUNTER — Other Ambulatory Visit: Payer: Self-pay | Admitting: Urology

## 2018-07-31 ENCOUNTER — Encounter (HOSPITAL_BASED_OUTPATIENT_CLINIC_OR_DEPARTMENT_OTHER): Payer: Self-pay

## 2018-08-03 ENCOUNTER — Encounter (HOSPITAL_BASED_OUTPATIENT_CLINIC_OR_DEPARTMENT_OTHER): Payer: Self-pay

## 2018-08-03 ENCOUNTER — Other Ambulatory Visit: Payer: Self-pay

## 2018-08-03 NOTE — Progress Notes (Signed)
Spoke with:  Stacey Herman NPO:  After Midnight, no gum, candy, or mints, NO smoking Arrival time:  [id number] Labs: Istat 4 (EKG 08/23/2017 chart/epic) AM medications: None Pre op orders: Yes Ride home:  Stacey Herman (daughter) will bring daughter's number when she arrives unsure of number at the moment.

## 2018-08-05 ENCOUNTER — Ambulatory Visit (HOSPITAL_BASED_OUTPATIENT_CLINIC_OR_DEPARTMENT_OTHER): Payer: Medicare Other | Admitting: Anesthesiology

## 2018-08-05 ENCOUNTER — Encounter (HOSPITAL_BASED_OUTPATIENT_CLINIC_OR_DEPARTMENT_OTHER): Payer: Self-pay | Admitting: Anesthesiology

## 2018-08-05 ENCOUNTER — Encounter (HOSPITAL_BASED_OUTPATIENT_CLINIC_OR_DEPARTMENT_OTHER)
Admission: RE | Disposition: A | Discharge status: Home / Self Care | Payer: Self-pay | Source: Ambulatory Visit | Attending: Urology

## 2018-08-05 ENCOUNTER — Other Ambulatory Visit: Payer: Self-pay

## 2018-08-05 ENCOUNTER — Ambulatory Visit (HOSPITAL_BASED_OUTPATIENT_CLINIC_OR_DEPARTMENT_OTHER)
Admission: RE | Admit: 2018-08-05 | Discharge: 2018-08-05 | Disposition: A | Discharge status: Home / Self Care | Payer: Medicare Other | Source: Ambulatory Visit | Attending: Urology | Admitting: Urology

## 2018-08-05 DIAGNOSIS — Z79899 Other long term (current) drug therapy: Secondary | ICD-10-CM | POA: Diagnosis not present

## 2018-08-05 DIAGNOSIS — Z886 Allergy status to analgesic agent status: Secondary | ICD-10-CM | POA: Diagnosis not present

## 2018-08-05 DIAGNOSIS — Z880 Allergy status to penicillin: Secondary | ICD-10-CM | POA: Insufficient documentation

## 2018-08-05 DIAGNOSIS — K219 Gastro-esophageal reflux disease without esophagitis: Secondary | ICD-10-CM | POA: Diagnosis not present

## 2018-08-05 DIAGNOSIS — F419 Anxiety disorder, unspecified: Secondary | ICD-10-CM | POA: Diagnosis not present

## 2018-08-05 DIAGNOSIS — F17210 Nicotine dependence, cigarettes, uncomplicated: Secondary | ICD-10-CM | POA: Insufficient documentation

## 2018-08-05 DIAGNOSIS — N132 Hydronephrosis with renal and ureteral calculous obstruction: Secondary | ICD-10-CM | POA: Insufficient documentation

## 2018-08-05 DIAGNOSIS — Z888 Allergy status to other drugs, medicaments and biological substances status: Secondary | ICD-10-CM | POA: Diagnosis not present

## 2018-08-05 DIAGNOSIS — M1990 Unspecified osteoarthritis, unspecified site: Secondary | ICD-10-CM | POA: Diagnosis not present

## 2018-08-05 DIAGNOSIS — Z981 Arthrodesis status: Secondary | ICD-10-CM | POA: Diagnosis not present

## 2018-08-05 DIAGNOSIS — Z96649 Presence of unspecified artificial hip joint: Secondary | ICD-10-CM | POA: Diagnosis not present

## 2018-08-05 DIAGNOSIS — Z882 Allergy status to sulfonamides status: Secondary | ICD-10-CM | POA: Insufficient documentation

## 2018-08-05 DIAGNOSIS — M47816 Spondylosis without myelopathy or radiculopathy, lumbar region: Secondary | ICD-10-CM | POA: Insufficient documentation

## 2018-08-05 DIAGNOSIS — J449 Chronic obstructive pulmonary disease, unspecified: Secondary | ICD-10-CM | POA: Insufficient documentation

## 2018-08-05 DIAGNOSIS — Z87442 Personal history of urinary calculi: Secondary | ICD-10-CM | POA: Diagnosis not present

## 2018-08-05 DIAGNOSIS — Z885 Allergy status to narcotic agent status: Secondary | ICD-10-CM | POA: Insufficient documentation

## 2018-08-05 HISTORY — DX: Personal history of other diseases of the respiratory system: Z87.09

## 2018-08-05 HISTORY — DX: Personal history of urinary calculi: Z87.442

## 2018-08-05 HISTORY — DX: Other complications of anesthesia, initial encounter: T88.59XA

## 2018-08-05 HISTORY — DX: Atherosclerosis of aorta: I70.0

## 2018-08-05 HISTORY — DX: Headache: R51

## 2018-08-05 HISTORY — DX: Other intervertebral disc degeneration, lumbar region: M51.36

## 2018-08-05 HISTORY — PX: CYSTOSCOPY/URETEROSCOPY/HOLMIUM LASER/STENT PLACEMENT: SHX6546

## 2018-08-05 HISTORY — DX: Presence of dental prosthetic device (complete) (partial): Z97.2

## 2018-08-05 HISTORY — DX: Spondylosis without myelopathy or radiculopathy, lumbar region: M47.816

## 2018-08-05 HISTORY — DX: Headache, unspecified: R51.9

## 2018-08-05 HISTORY — DX: Adverse effect of unspecified anesthetic, initial encounter: T41.45XA

## 2018-08-05 HISTORY — DX: Other intervertebral disc degeneration, lumbar region without mention of lumbar back pain or lower extremity pain: M51.369

## 2018-08-05 HISTORY — DX: Presence of spectacles and contact lenses: Z97.3

## 2018-08-05 HISTORY — DX: Claustrophobia: F40.240

## 2018-08-05 HISTORY — DX: Personal history of diseases of the blood and blood-forming organs and certain disorders involving the immune mechanism: Z86.2

## 2018-08-05 LAB — POCT I-STAT 4, (NA,K, GLUC, HGB,HCT)
GLUCOSE: 92 mg/dL (ref 70–99)
HEMATOCRIT: 47 % — AB (ref 36.0–46.0)
Hemoglobin: 16 g/dL — ABNORMAL HIGH (ref 12.0–15.0)
Potassium: 4.2 mmol/L (ref 3.5–5.1)
Sodium: 139 mmol/L (ref 135–145)

## 2018-08-05 SURGERY — CYSTOSCOPY/URETEROSCOPY/HOLMIUM LASER/STENT PLACEMENT
Anesthesia: General | Laterality: Left

## 2018-08-05 MED ORDER — FENTANYL CITRATE (PF) 100 MCG/2ML IJ SOLN
INTRAMUSCULAR | Status: AC
  Filled 2018-08-05: qty 2

## 2018-08-05 MED ORDER — PHENYLEPHRINE 40 MCG/ML (10ML) SYRINGE FOR IV PUSH (FOR BLOOD PRESSURE SUPPORT)
PREFILLED_SYRINGE | INTRAVENOUS | Status: AC
  Filled 2018-08-05: qty 10

## 2018-08-05 MED ORDER — PROPOFOL 10 MG/ML IV BOLUS
INTRAVENOUS | Status: AC
  Filled 2018-08-05: qty 20

## 2018-08-05 MED ORDER — PHENYLEPHRINE 40 MCG/ML (10ML) SYRINGE FOR IV PUSH (FOR BLOOD PRESSURE SUPPORT)
PREFILLED_SYRINGE | INTRAVENOUS | Status: DC | PRN
  Administered 2018-08-05: 80 ug via INTRAVENOUS

## 2018-08-05 MED ORDER — DEXAMETHASONE SODIUM PHOSPHATE 10 MG/ML IJ SOLN
INTRAMUSCULAR | Status: AC
  Filled 2018-08-05: qty 1

## 2018-08-05 MED ORDER — ONDANSETRON HCL 4 MG/2ML IJ SOLN
INTRAMUSCULAR | Status: DC | PRN
  Administered 2018-08-05: 4 mg via INTRAVENOUS

## 2018-08-05 MED ORDER — DEXAMETHASONE SODIUM PHOSPHATE 4 MG/ML IJ SOLN
INTRAMUSCULAR | Status: DC | PRN
  Administered 2018-08-05: 8 mg via INTRAVENOUS

## 2018-08-05 MED ORDER — PROPOFOL 10 MG/ML IV BOLUS
INTRAVENOUS | Status: DC | PRN
  Administered 2018-08-05: 100 mg via INTRAVENOUS

## 2018-08-05 MED ORDER — IOHEXOL 300 MG/ML  SOLN
INTRAMUSCULAR | Status: DC | PRN
  Administered 2018-08-05: 10 mL via URETHRAL

## 2018-08-05 MED ORDER — MIDAZOLAM HCL 5 MG/5ML IJ SOLN
INTRAMUSCULAR | Status: DC | PRN
  Administered 2018-08-05: 2 mg via INTRAVENOUS

## 2018-08-05 MED ORDER — EPHEDRINE SULFATE-NACL 50-0.9 MG/10ML-% IV SOSY
PREFILLED_SYRINGE | INTRAVENOUS | Status: DC | PRN
  Administered 2018-08-05: 10 mg via INTRAVENOUS

## 2018-08-05 MED ORDER — SODIUM CHLORIDE 0.9 % IR SOLN
Status: DC | PRN
  Administered 2018-08-05: 3000 mL via INTRAVESICAL

## 2018-08-05 MED ORDER — SODIUM CHLORIDE 0.9 % IV SOLN
1.0000 g | Freq: Three times a day (TID) | INTRAVENOUS | Status: DC
  Administered 2018-08-05: 1 g via INTRAVENOUS
  Filled 2018-08-05 (×2): qty 1

## 2018-08-05 MED ORDER — FENTANYL CITRATE (PF) 100 MCG/2ML IJ SOLN
25.0000 ug | INTRAMUSCULAR | Status: DC | PRN
  Filled 2018-08-05: qty 1

## 2018-08-05 MED ORDER — LIDOCAINE HCL (CARDIAC) PF 100 MG/5ML IV SOSY
PREFILLED_SYRINGE | INTRAVENOUS | Status: AC
  Filled 2018-08-05: qty 5

## 2018-08-05 MED ORDER — FENTANYL CITRATE (PF) 100 MCG/2ML IJ SOLN
INTRAMUSCULAR | Status: DC | PRN
  Administered 2018-08-05: 25 ug via INTRAVENOUS
  Administered 2018-08-05: 50 ug via INTRAVENOUS
  Administered 2018-08-05: 25 ug via INTRAVENOUS

## 2018-08-05 MED ORDER — ONDANSETRON HCL 4 MG/2ML IJ SOLN
INTRAMUSCULAR | Status: AC
  Filled 2018-08-05: qty 2

## 2018-08-05 MED ORDER — HYDROCODONE-ACETAMINOPHEN 7.5-325 MG PO TABS
1.0000 | ORAL_TABLET | Freq: Once | ORAL | Status: DC | PRN
  Filled 2018-08-05: qty 1

## 2018-08-05 MED ORDER — METOCLOPRAMIDE HCL 5 MG/ML IJ SOLN
10.0000 mg | Freq: Once | INTRAMUSCULAR | Status: DC | PRN
  Filled 2018-08-05: qty 2

## 2018-08-05 MED ORDER — MIDAZOLAM HCL 2 MG/2ML IJ SOLN
INTRAMUSCULAR | Status: AC
  Filled 2018-08-05: qty 2

## 2018-08-05 MED ORDER — LIDOCAINE 2% (20 MG/ML) 5 ML SYRINGE
INTRAMUSCULAR | Status: DC | PRN
  Administered 2018-08-05: 80 mg via INTRAVENOUS

## 2018-08-05 MED ORDER — MEPERIDINE HCL 25 MG/ML IJ SOLN
6.2500 mg | INTRAMUSCULAR | Status: DC | PRN
  Filled 2018-08-05: qty 1

## 2018-08-05 MED ORDER — EPHEDRINE 5 MG/ML INJ
INTRAVENOUS | Status: AC
  Filled 2018-08-05: qty 10

## 2018-08-05 MED ORDER — LACTATED RINGERS IV SOLN
INTRAVENOUS | Status: DC
  Administered 2018-08-05 (×2): 1000 mL via INTRAVENOUS
  Filled 2018-08-05: qty 1000

## 2018-08-05 SURGICAL SUPPLY — 20 items
BAG DRAIN URO-CYSTO SKYTR STRL (DRAIN) ×3 IMPLANT
CATH INTERMIT  6FR 70CM (CATHETERS) ×3 IMPLANT
CLOTH BEACON ORANGE TIMEOUT ST (SAFETY) ×3 IMPLANT
FIBER LASER FLEXIVA 365 (UROLOGICAL SUPPLIES) IMPLANT
FIBER LASER TRAC TIP (UROLOGICAL SUPPLIES) ×3 IMPLANT
GLOVE BIO SURGEON STRL SZ7.5 (GLOVE) ×3 IMPLANT
GOWN STRL REUS W/TWL XL LVL3 (GOWN DISPOSABLE) ×3 IMPLANT
GUIDEWIRE STR DUAL SENSOR (WIRE) ×3 IMPLANT
INFUSOR MANOMETER BAG 3000ML (MISCELLANEOUS) ×3 IMPLANT
IV NS 1000ML (IV SOLUTION) ×2
IV NS 1000ML BAXH (IV SOLUTION) ×1 IMPLANT
IV NS IRRIG 3000ML ARTHROMATIC (IV SOLUTION) ×3 IMPLANT
KIT TURNOVER CYSTO (KITS) ×3 IMPLANT
MANIFOLD NEPTUNE II (INSTRUMENTS) ×3 IMPLANT
NS IRRIG 500ML POUR BTL (IV SOLUTION) ×6 IMPLANT
PACK CYSTO (CUSTOM PROCEDURE TRAY) ×3 IMPLANT
STENT URET 6FRX26 CONTOUR (STENTS) ×3 IMPLANT
TUBE CONNECTING 12'X1/4 (SUCTIONS) ×1
TUBE CONNECTING 12X1/4 (SUCTIONS) ×2 IMPLANT
TUBING UROLOGY SET (TUBING) ×3 IMPLANT

## 2018-08-05 NOTE — Anesthesia Preprocedure Evaluation (Signed)
Anesthesia Evaluation  Patient identified by MRN, date of birth, ID band Patient awake    Reviewed: Allergy & Precautions, NPO status , Patient's Chart, lab work & pertinent test results  History of Anesthesia Complications (+) Emergence Delirium and history of anesthetic complications  Airway Mallampati: I  TM Distance: >3 FB Neck ROM: Full    Dental  (+) Edentulous Upper, Edentulous Lower   Pulmonary COPD,  COPD inhaler, Current Smoker,    breath sounds clear to auscultation + decreased breath sounds      Cardiovascular negative cardio ROS   Rhythm:Regular Rate:Normal     Neuro/Psych  Headaches, Anxiety    GI/Hepatic Neg liver ROS, GERD  Medicated and Controlled,  Endo/Other  negative endocrine ROS  Renal/GU Renal diseaseLeft ureteral calculus  negative genitourinary   Musculoskeletal  (+) Arthritis , Osteoarthritis,  Spondylolysis Lumbar spine   Abdominal (+) - obese,   Peds  Hematology negative hematology ROS (+)   Anesthesia Other Findings   Reproductive/Obstetrics                             Lab Results  Component Value Date   WBC 5.9 07/26/2018   HGB 14.2 07/26/2018   HCT 43.5 07/26/2018   MCV 98.0 07/26/2018   PLT 195 07/26/2018   Lab Results  Component Value Date   CREATININE 0.70 07/26/2018   BUN 9 07/26/2018   NA 140 07/26/2018   K 4.3 07/26/2018   CL 102 07/26/2018   CO2 33 (H) 07/26/2018   No results found for: INR, PROTIME  EKG: sinus tachycardia.  Anesthesia Physical  Anesthesia Plan  ASA: II and emergent  Anesthesia Plan: General   Post-op Pain Management:    Induction: Intravenous  PONV Risk Score and Plan: 3 and Ondansetron, Dexamethasone, Midazolam and Treatment may vary due to age or medical condition  Airway Management Planned: LMA  Additional Equipment:   Intra-op Plan:   Post-operative Plan: Extubation in OR  Informed Consent: I  have reviewed the patients History and Physical, chart, labs and discussed the procedure including the risks, benefits and alternatives for the proposed anesthesia with the patient or authorized representative who has indicated his/her understanding and acceptance.   Dental advisory given  Plan Discussed with: CRNA and Surgeon  Anesthesia Plan Comments:         Anesthesia Quick Evaluation

## 2018-08-05 NOTE — Progress Notes (Signed)
Pharmacy Antibiotic Note  Stacey Herman is a 62 y.o. female admitted on 08/05/2018 with empiric therapy.  Recent urine culture (9/30) = NG (final).  Cystoscopy with stent placement scheduled for today.  Pharmacy has been consulted for Meropenem dosing.  Plan: Meropenem 1gm IV q8h F/U length of therapy  Height: 5\' 10"  (177.8 cm) Weight: 191 lb 8 oz (86.9 kg) IBW/kg (Calculated) : 68.5  Temp (24hrs), Avg:98.3 F (36.8 C), Min:98.3 F (36.8 C), Max:98.3 F (36.8 C)  No results for input(s): WBC, CREATININE, LATICACIDVEN, VANCOTROUGH, VANCOPEAK, VANCORANDOM, GENTTROUGH, GENTPEAK, GENTRANDOM, TOBRATROUGH, TOBRAPEAK, TOBRARND, AMIKACINPEAK, AMIKACINTROU, AMIKACIN in the last 168 hours.  Estimated Creatinine Clearance: 89.6 mL/min (by C-G formula based on SCr of 0.7 mg/dL).    Allergies  Allergen Reactions  . Amoxicillin Anaphylaxis and Hives  . Aspirin Anaphylaxis and Hives  . Benadryl [Diphenhydramine] Anaphylaxis and Hives  . Ciprocin-Fluocin-Procin [Fluocinolone] Anaphylaxis and Hives  . Ciprofloxacin Anaphylaxis  . Morphine And Related Anaphylaxis and Hives  . Mushroom Extract Complex Anaphylaxis and Hives  . Penicillins Anaphylaxis and Hives    Unknown Has patient had a PCN reaction causing immediate rash, facial/tongue/throat swelling, SOB or lightheadedness with hypotension: Yes Has patient had a PCN reaction causing severe rash involving mucus membranes or skin necrosis: Yes Has patient had a PCN reaction that required hospitalization: Yes Has patient had a PCN reaction occurring within the last 10 years: Yes If all of the above answers are "NO", then may proceed with Cephalosporin use.   . Sulfa Antibiotics Anaphylaxis and Hives    Antimicrobials this admission: 08/05/18 Meropenem >>      Dose adjustments this admission:    Microbiology results: 9/30 UCx: NG (final)   Thank you for allowing pharmacy to be a part of this patient's care.  Everette Rank,  PharmD 08/05/2018 8:16 AM

## 2018-08-05 NOTE — Discharge Instructions (Addendum)
[facility] °[phone] °Post Ureteroscopy With or Without Stent Instructions ° °Definitions: ° °Ureter: The duct that transports urine from the kidney to the bladder. °Stent:   A plastic hollow tube that is placed into the ureter, from the kidney to the                 bladder to prevent the ureter from swelling shut. ° °GENERAL INSTRUCTIONS: ° °Despite the fact that no skin incisions were used, the area around the ureter and bladder is raw and irritated. The stent is a foreign body which will further irritate the bladder wall. This irritation is manifested by increased frequency of urination, both day and night, and by an increase in the urge to urinate. In some, the urge to urinate is present almost always. Sometimes the urge is strong enough that you may not be able to stop yourself from urinating. The only real cure is to remove the stent and then give time for the bladder wall to heal which can't be done until the danger of the ureter swelling shut has passed, which varies. ° °You may see some blood in your urine while the stent is in place and a few days afterwards. Do not be alarmed, even if the urine was clear for a while. Get off your feet and drink lots of fluids until clearing occurs. If you start to pass clots or don't improve, call us. ° °DIET: °You may return to your normal diet immediately. Because of the raw surface of your bladder, alcohol, spicy foods, acid type foods and drinks with caffeine may cause irritation or frequency and should be used in moderation. To keep your urine flowing freely and to avoid constipation, drink plenty of fluids during the day ( 8-10 glasses ). °Tip: Avoid cranberry juice because it is very acidic. ° °ACTIVITY: °Your physical activity doesn't need to be restricted. However, if you are very active, you may see some blood in your urine. We suggest that you reduce your activity under these circumstances until the bleeding has stopped. ° °BOWELS: °It is  important to keep your bowels regular during the postoperative period. Straining with bowel movements can cause bleeding. A bowel movement every other day is reasonable. Use a mild laxative if needed, such as Milk of Magnesia 2-3 tablespoons, or 2 Dulcolax tablets. Call if you continue to have problems. If you have been taking narcotics for pain, before, during or after your surgery, you may be constipated. Take a laxative if necessary. ° ° °MEDICATION: °You should resume your pre-surgery medications unless told not to. °You may take oxybutynin or flomax if prescribed for bladder spasms or discomfort from the stent °Take pain medication as directed for pain refractory to conservative management ° °PROBLEMS YOU SHOULD REPORT TO US: °· Fevers over 100.5 Fahrenheit. °· Heavy bleeding, or clots ( See above notes about blood in urine ). °· Inability to urinate. °· Drug reactions ( hives, rash, nausea, vomiting, diarrhea ). °· Severe burning or pain with urination that is not improving. ° ° °Post Anesthesia Home Care Instructions ° °Activity: °Get plenty of rest for the remainder of the day. A responsible individual must stay with you for 24 hours following the procedure.  °For the next 24 hours, DO NOT: °-Drive a car °-Operate machinery °-Drink alcoholic beverages °-Take any medication unless instructed by your physician °-Make any legal decisions or sign important papers. ° °Meals: °Start with liquid foods such as gelatin or soup. Progress to regular foods   as tolerated. Avoid greasy, spicy, heavy foods. If nausea and/or vomiting occur, drink only clear liquids until the nausea and/or vomiting subsides. Call your physician if vomiting continues. ° °Special Instructions/Symptoms: °Your throat may feel dry or sore from the anesthesia or the breathing tube placed in your throat during surgery. If this causes discomfort, gargle with warm salt water. The discomfort should disappear within 24 hours. ° °If you had a scopolamine  patch placed behind your ear for the management of post- operative nausea and/or vomiting: ° °1. The medication in the patch is effective for 72 hours, after which it should be removed.  Wrap patch in a tissue and discard in the trash. Wash hands thoroughly with soap and water. °2. You may remove the patch earlier than 72 hours if you experience unpleasant side effects which may include dry mouth, dizziness or visual disturbances. °3. Avoid touching the patch. Wash your hands with soap and water after contact with the patch. °  ° ° °

## 2018-08-05 NOTE — Op Note (Signed)
Operative Note  Preoperative diagnosis:  1.  Left ureteral calculi  Postoperative diagnosis: 1.  Left ureteral calculi  Procedure(s): 1.  Cystoscopy with left retrograde pyelogram, left ureteroscopy with laser lithotripsy and ureteral stent exchange  Surgeon: Link Snuffer, MD  Assistants: None  Anesthesia: General  Complications: None immediate  EBL: Minimal  Specimens: 1.  None  Drains/Catheters: 1.  6 x 26 double-J ureteral stent  Intraoperative findings: 1.  Normal urethra and bladder 2.  Persistent left-sided hydronephrosis on retrograde pyelogram.  There was significant ureteral edema and inflammation and scar tissue at the mid ureter at the level of stone impaction indicating a long-standing stone at that area.  All stone was fragmented to tiny fragments in the ureter.  Indication: 62 year old female with a history of left ureteral calculi status post stent placement presents for definitive management of the ureteral calculi  Description of procedure:  The patient was identified and consent was obtained.  The patient was taken to the operating room and placed in the supine position.  The patient was placed under general anesthesia.  Perioperative antibiotics were administered.  The patient was placed in dorsal lithotomy.  Patient was prepped and draped in a standard sterile fashion and a timeout was performed.  A 21 French rigid cystoscope was advanced into the urethra and into the bladder.  The stent was grasped and pulled just beyond the urethral meatus.  A sensor wire was advanced up the stent and into the kidney under fluoroscopic guidance.  The stent was withdrawn.  A semirigid ureteroscope was advanced alongside the wire up to the stacked stones of interest which were fragmented to tiny fragments.  I advanced the scope up to the renal pelvis and no other ureteral calculi were seen.  I then shot a retrograde pyelogram through the scope.  Findings are noted above.  I then  carefully withdrew the scope and noted the area of inflammation and edema at the level of stone impaction.  Other than that, there was no significant trauma or ureteral calculi seen besides that point.  I then backloaded the wire onto a rigid cystoscope which was advanced into the bladder and a 6 x 26 double-J ureteral stent was placed in a standard fashion followed by removal of the wire.  Fluoroscopy confirmed proximal placement and direct visualization confirmed a good coil within the bladder.  I drained the bladder and withdrew the scope.  This concluded the operation.  The patient tolerated procedure well was stable postoperatively.  Plan: Follow-up in 1 week for ureteral stent removal.

## 2018-08-05 NOTE — Transfer of Care (Signed)
   Last Vitals:  Vitals Value Taken Time  BP 114/75 08/05/2018 10:07 AM  Temp    Pulse 83 08/05/2018 10:11 AM  Resp 14 08/05/2018 10:11 AM  SpO2 93 % 08/05/2018 10:11 AM  Vitals shown include unvalidated device data.  Last Pain:  Vitals:   08/05/18 0836  TempSrc:   PainSc: 5       Patients Stated Pain Goal: 7 (08/05/18 8185) Immediate Anesthesia Transfer of Care Note  Patient: Stacey Herman  Procedure(s) Performed: Procedure(s) (LRB): CYSTOSCOPY LEFT /URETEROSCOPY/HOLMIUM LASER/STENT EXCHANGE (Left)  Patient Location: PACU  Anesthesia Type: General  Level of Consciousness: awake, alert  and oriented  Airway & Oxygen Therapy: Patient Spontanous Breathing and Patient connected to nasal cannula oxygen  Post-op Assessment: Report given to PACU RN and Post -op Vital signs reviewed and stable  Post vital signs: Reviewed and stable  Complications: No apparent anesthesia complications

## 2018-08-05 NOTE — Anesthesia Procedure Notes (Signed)
Procedure Name: LMA Insertion Date/Time: 08/05/2018 9:34 AM Performed by: Josephine Igo, MD Pre-anesthesia Checklist: Patient identified, Emergency Drugs available, Suction available and Patient being monitored Patient Re-evaluated:Patient Re-evaluated prior to induction Oxygen Delivery Method: Circle system utilized Preoxygenation: Pre-oxygenation with 100% oxygen Induction Type: IV induction Ventilation: Mask ventilation without difficulty LMA: LMA inserted LMA Size: 4.0 Number of attempts: 1 Airway Equipment and Method: Bite block Placement Confirmation: positive ETCO2 Tube secured with: Tape Dental Injury: Teeth and Oropharynx as per pre-operative assessment

## 2018-08-05 NOTE — Interval H&P Note (Signed)
History and Physical Interval Note:  08/05/2018 8:21 AM  Stacey Herman  has presented today for surgery, with the diagnosis of LEFT URETERAL STONE  The various methods of treatment have been discussed with the patient and family. After consideration of risks, benefits and other options for treatment, the patient has consented to  Procedure(s): CYSTOSCOPY LEFT /URETEROSCOPY/HOLMIUM LASER/STENT PLACEMENT (Left) as a surgical intervention .  The patient's history has been reviewed, patient examined, no change in status, stable for surgery.  I have reviewed the patient's chart and labs.  Questions were answered to the patient's satisfaction.     Marton Redwood, III

## 2018-08-05 NOTE — Anesthesia Postprocedure Evaluation (Signed)
Anesthesia Post Note  Patient: Rayburn Felt  Procedure(s) Performed: CYSTOSCOPY LEFT /URETEROSCOPY/HOLMIUM LASER/STENT EXCHANGE (Left )     Patient location during evaluation: PACU Anesthesia Type: General Level of consciousness: awake and alert and oriented Pain management: pain level controlled Vital Signs Assessment: post-procedure vital signs reviewed and stable Respiratory status: spontaneous breathing, nonlabored ventilation and respiratory function stable Cardiovascular status: blood pressure returned to baseline and stable Postop Assessment: no apparent nausea or vomiting Anesthetic complications: no    Last Vitals:  Vitals:   08/05/18 1035 08/05/18 1036  BP:  (!) 117/53  Pulse: 85 76  Resp: 14 12  Temp:    SpO2: 99% 98%    Last Pain:  Vitals:   08/05/18 1007  TempSrc:   PainSc: Asleep                 Gershom Brobeck A.

## 2018-08-06 ENCOUNTER — Encounter (HOSPITAL_BASED_OUTPATIENT_CLINIC_OR_DEPARTMENT_OTHER): Payer: Self-pay | Admitting: Urology

## 2018-10-12 ENCOUNTER — Encounter (HOSPITAL_COMMUNITY): Payer: Self-pay | Admitting: Emergency Medicine

## 2018-10-12 ENCOUNTER — Emergency Department (HOSPITAL_COMMUNITY): Payer: Medicare Other

## 2018-10-12 ENCOUNTER — Emergency Department (HOSPITAL_COMMUNITY)
Admission: EM | Admit: 2018-10-12 | Discharge: 2018-10-12 | Disposition: A | Discharge status: Home / Self Care | Payer: Medicare Other | Attending: Emergency Medicine | Admitting: Emergency Medicine

## 2018-10-12 DIAGNOSIS — F17210 Nicotine dependence, cigarettes, uncomplicated: Secondary | ICD-10-CM | POA: Diagnosis not present

## 2018-10-12 DIAGNOSIS — R0789 Other chest pain: Secondary | ICD-10-CM | POA: Insufficient documentation

## 2018-10-12 DIAGNOSIS — J449 Chronic obstructive pulmonary disease, unspecified: Secondary | ICD-10-CM | POA: Diagnosis not present

## 2018-10-12 DIAGNOSIS — Z79899 Other long term (current) drug therapy: Secondary | ICD-10-CM | POA: Diagnosis not present

## 2018-10-12 LAB — BASIC METABOLIC PANEL
Anion gap: 11 (ref 5–15)
BUN: 5 mg/dL — ABNORMAL LOW (ref 6–20)
CO2: 27 mmol/L (ref 22–32)
Calcium: 8.8 mg/dL — ABNORMAL LOW (ref 8.9–10.3)
Chloride: 99 mmol/L (ref 98–111)
Creatinine, Ser: 0.67 mg/dL (ref 0.44–1.00)
GFR calc Af Amer: >60 mL/min (ref >60)
GFR calc non Af Amer: >60 mL/min (ref >60)
Glucose, Bld: 94 mg/dL (ref 70–99)
Potassium: 3.6 mmol/L (ref 3.5–5.1)
Sodium: 137 mmol/L (ref 135–145)

## 2018-10-12 LAB — CBC
HCT: 44.3 % (ref 36.0–46.0)
Hemoglobin: 13.7 g/dL (ref 12.0–15.0)
MCH: 30.4 pg (ref 26.0–34.0)
MCHC: 30.9 g/dL (ref 30.0–36.0)
MCV: 98.2 fL (ref 80.0–100.0)
PLATELETS: 223 10*3/uL (ref 150–400)
RBC: 4.51 MIL/uL (ref 3.87–5.11)
RDW: 12.5 % (ref 11.5–15.5)
WBC: 6.5 10*3/uL (ref 4.0–10.5)
nRBC: 0 % (ref 0.0–0.2)

## 2018-10-12 LAB — I-STAT TROPONIN, ED
Troponin i, poc: 0 ng/mL (ref 0.00–0.08)
Troponin i, poc: 0 ng/mL (ref 0.00–0.08)

## 2018-10-12 LAB — I-STAT BETA HCG BLOOD, ED (MC, WL, AP ONLY): I-stat hCG, quantitative: 7.7 m[IU]/mL — ABNORMAL HIGH (ref <5)

## 2018-10-12 MED ORDER — ACETAMINOPHEN 500 MG PO TABS
1000.0000 mg | ORAL_TABLET | Freq: Once | ORAL | Status: AC
  Administered 2018-10-12: 1000 mg via ORAL
  Filled 2018-10-12: qty 2

## 2018-10-12 MED ORDER — OXYCODONE-ACETAMINOPHEN 5-325 MG PO TABS
2.0000 | ORAL_TABLET | Freq: Once | ORAL | Status: AC
  Administered 2018-10-12: 2 via ORAL
  Filled 2018-10-12: qty 2

## 2018-10-12 NOTE — ED Notes (Addendum)
Pt instructed to use call light for nursing staff assistance when finished using bedside commode. Minimal assistance needed.

## 2018-10-12 NOTE — ED Triage Notes (Addendum)
Pt arrives by [facility]- with chest pain that started at 230am- feels like something is sitting on her chest. No cardiac history per pt. Pt has had 1 sl nitro with no relief.   allergic to aspirin  Ems vitals  119/67 HR 76 RR 18 02 sats : 96%

## 2018-10-12 NOTE — Discharge Instructions (Addendum)
Test showed no heart evidence of a heart attack.  Take your Percocet for pain.  Follow-up with your primary care doctor tomorrow for recheck.  He can evaluate all the tests that were done here today.

## 2018-10-13 NOTE — ED Provider Notes (Signed)
[facility] EMERGENCY DEPARTMENT Provider Note   CSN: [id number] Arrival date & time: 10/12/18  1241     History   Chief Complaint Chief Complaint  Patient presents with  . Chest Pain    HPI Stacey Herman is a 62 y.o. female.  Left-sided chest pain for approximately 24 hours without dyspnea, diaphoresis, nausea.  No known history of cardiac disease.  No prolonged travel or immobilization.  Chest is tender to palpation.  Severity is moderate.  Nothing makes symptoms better or worse.  Patient took one nitroglycerin which did not help.     Past Medical History:  Diagnosis Date  . Acid reflux   . Aortic atherosclerosis ([place])   . Claustrophobia   . Claustrophobia    fears anesthesia and unable to have MRI unless open.  cannot tolerate head being confined.  . Complication of anesthesia    "fights against anesthesia"  . COPD (chronic obstructive pulmonary disease) ([place])   . DDD (degenerative disc disease), lumbar   . Headache    occ  . History of bronchitis   . History of iron deficiency anemia   . History of kidney stones   . Kidney stones   . Lumbar spondylosis   . Medical history non-contributory   . Renal disorder   . Wears dentures    upper and lower  . Wears glasses     Patient Active Problem List   Diagnosis Date Noted  . Hydronephrosis with renal and ureteral calculus obstruction 07/25/2018  . Hydronephrosis with renal and ureteral calculous obstruction 07/25/2018  . SIRS (systemic inflammatory response syndrome) ([place]) 08/24/2017  . Elevated lactic acid level 08/24/2017  . Hypokalemia 08/24/2017  . Hyperglycemia 08/24/2017  . COPD (chronic obstructive pulmonary disease) ([place]) 08/23/2017  . Chest pain 07/20/2017    Past Surgical History:  Procedure Laterality Date  . APPENDECTOMY    . BACK SURGERY     x2  . CERVICAL FUSION     x3  . CYSTOSCOPY WITH STENT PLACEMENT Left 07/26/2018   Procedure: CYSTOSCOPY, LEFT RETROGRADE, WITH  LEFT URETERAL STENT PLACEMENT;  Surgeon: Lucas Mallow, MD;  Location: [facility];  Service: Urology;  Laterality: Left;  . CYSTOSCOPY/URETEROSCOPY/HOLMIUM LASER/STENT PLACEMENT Left 08/05/2018   Procedure: CYSTOSCOPY LEFT /URETEROSCOPY/HOLMIUM LASER/STENT EXCHANGE;  Surgeon: Lucas Mallow, MD;  Location: [facility];  Service: Urology;  Laterality: Left;  . TOTAL HIP ARTHROPLASTY Left      OB History   No obstetric history on file.      Home Medications    Prior to Admission medications   Medication Sig Start Date End Date Taking? Authorizing Provider  acetaminophen (TYLENOL) 325 MG tablet Take 325-650 mg by mouth every 6 (six) hours as needed (for headaches or pain).   Yes [provider]  albuterol (PROVENTIL HFA;VENTOLIN HFA) 108 (90 Base) MCG/ACT inhaler Inhale 2 puffs into the lungs every 6 (six) hours as needed for wheezing or shortness of breath.   Yes [provider]  albuterol (PROVENTIL) (2.5 MG/3ML) 0.083% nebulizer solution Take 3 mLs (2.5 mg total) by nebulization every 6 (six) hours as needed for wheezing. 08/26/17  Yes Hosie Poisson, MD  cyclobenzaprine (FLEXERIL) 5 MG tablet Take 5 mg by mouth 3 (three) times daily as needed for muscle spasms.   Yes [provider]  esomeprazole (NEXIUM) 20 MG capsule Take 20 mg by mouth daily as needed (acid reflux).    Yes [provider]  Melatonin 3  MG CAPS Take 3 mg by mouth at bedtime as needed (sleep).    Yes [provider]  Multiple Vitamin (MULTIVITAMIN WITH MINERALS) TABS tablet Take 1 tablet by mouth daily.   Yes [provider]  oxyCODONE-acetaminophen (PERCOCET) 10-325 MG tablet Take 1 tablet by mouth every 6 (six) hours as needed for pain. Patient taking differently: Take 1 tablet by mouth 3 (three) times daily.  01/30/17  Yes Orlie Dakin, MD  traZODone (DESYREL) 50 MG tablet Take 1 tablet (50 mg total) by mouth at bedtime as needed for sleep. Patient  not taking: Reported on 07/25/2018 07/21/17   Geradine Girt, DO    Family History Family History  Problem Relation Age of Onset  . Cancer Mother   . Heart attack Father   . Heart attack Paternal Grandmother     Social History Social History   Tobacco Use  . Smoking status: Current Every Day Smoker    Packs/day: 0.25    Years: 46.00    Pack years: 11.50    Types: Cigarettes  . Smokeless tobacco: Never Used  Substance Use Topics  . Alcohol use: Yes    Comment: rare  . Drug use: No     Allergies   Amoxicillin; Aspirin; Benadryl [diphenhydramine]; Ciprocin-fluocin-procin [fluocinolone]; Ciprofloxacin; Morphine and related; Mushroom extract complex; Penicillins; Sulfa antibiotics; and Bactrim [sulfamethoxazole-trimethoprim]   Review of Systems Review of Systems  All other systems reviewed and are negative.    Physical Exam Updated Vital Signs BP 125/89 (BP Location: Right Arm)   Pulse 68   Temp 98.1 F (36.7 C) (Oral)   Resp 14   Ht 5' 10.5" (1.791 m)   Wt 80.7 kg   SpO2 95%   BMI 25.18 kg/m   Physical Exam Vitals signs and nursing note reviewed.  Constitutional:      Appearance: She is well-developed.  HENT:     Head: Normocephalic and atraumatic.  Eyes:     Conjunctiva/sclera: Conjunctivae normal.  Neck:     Musculoskeletal: Neck supple.  Cardiovascular:     Rate and Rhythm: Normal rate and regular rhythm.  Pulmonary:     Effort: Pulmonary effort is normal.     Breath sounds: Normal breath sounds.     Comments: Left chest wall tender to palpation Abdominal:     General: Bowel sounds are normal.     Palpations: Abdomen is soft.  Musculoskeletal: Normal range of motion.  Skin:    General: Skin is warm and dry.  Neurological:     Mental Status: She is alert and oriented to person, place, and time.  Psychiatric:        Behavior: Behavior normal.      ED Treatments / Results  Labs (all labs ordered are listed, but only abnormal results are  displayed) Labs Reviewed  BASIC METABOLIC PANEL - Abnormal; Notable for the following components:      Result Value   BUN 5 (*)    Calcium 8.8 (*)    All other components within normal limits  I-STAT BETA HCG BLOOD, ED ([facility], [facility], [facility] ONLY) - Abnormal; Notable for the following components:   I-stat hCG, quantitative 7.7 (*)    All other components within normal limits  CBC  I-STAT TROPONIN, ED  I-STAT TROPONIN, ED    EKG  Date: 10/13/2018  Rate: 84  Rhythm: normal sinus rhythm  QRS Axis: normal  Intervals: normal  ST/T Wave abnormalities: normal  Conduction Disutrbances: none  Narrative Interpretation: unremarkable  Radiology Dg Chest 2 View  Result Date: 10/12/2018 CLINICAL DATA:  Chest pain. EXAM: CHEST - 2 VIEW COMPARISON:  August 23, 2017 FINDINGS: The heart size and mediastinal contours are within normal limits. Both lungs are clear. The visualized skeletal structures are unremarkable. IMPRESSION: No active cardiopulmonary disease. Electronically Signed   By: Dorise Bullion III M.D   On: 10/12/2018 13:11    Procedures Procedures (including critical care time)  Medications Ordered in ED Medications  acetaminophen (TYLENOL) tablet 1,000 mg (1,000 mg Oral Given 10/12/18 1606)  oxyCODONE-acetaminophen (PERCOCET/ROXICET) 5-325 MG per tablet 2 tablet (2 tablets Oral Given 10/12/18 1738)     Initial Impression / Assessment and Plan / ED Course  I have reviewed the triage vital signs and the nursing notes.  Pertinent labs & imaging results that were available during my care of the patient were reviewed by me and considered in my medical decision making (see chart for details).     Patient presents with left-sided chest pain for 24 hours.  EKG and chest x-ray are normal.  Delta troponin negative.  Suspect chest wall pain.  Patient will follow-up with her primary care doctor.  Final Clinical Impressions(s) / ED Diagnoses   Final diagnoses:  Chest wall pain     ED Discharge Orders    None       Nat Christen, MD 10/13/18 1904

## 2018-11-14 ENCOUNTER — Other Ambulatory Visit: Payer: Self-pay

## 2018-11-14 ENCOUNTER — Emergency Department (HOSPITAL_COMMUNITY): Payer: Medicare Other

## 2018-11-14 ENCOUNTER — Emergency Department (HOSPITAL_COMMUNITY)
Admission: EM | Admit: 2018-11-14 | Discharge: 2018-11-14 | Disposition: A | Discharge status: Home / Self Care | Payer: Medicare Other | Attending: Emergency Medicine | Admitting: Emergency Medicine

## 2018-11-14 DIAGNOSIS — J441 Chronic obstructive pulmonary disease with (acute) exacerbation: Secondary | ICD-10-CM

## 2018-11-14 DIAGNOSIS — F17210 Nicotine dependence, cigarettes, uncomplicated: Secondary | ICD-10-CM | POA: Diagnosis not present

## 2018-11-14 DIAGNOSIS — R0602 Shortness of breath: Secondary | ICD-10-CM | POA: Diagnosis present

## 2018-11-14 DIAGNOSIS — Z79899 Other long term (current) drug therapy: Secondary | ICD-10-CM | POA: Diagnosis not present

## 2018-11-14 DIAGNOSIS — Z96642 Presence of left artificial hip joint: Secondary | ICD-10-CM | POA: Insufficient documentation

## 2018-11-14 DIAGNOSIS — J449 Chronic obstructive pulmonary disease, unspecified: Secondary | ICD-10-CM | POA: Insufficient documentation

## 2018-11-14 LAB — CBC
HCT: 46.8 % — ABNORMAL HIGH (ref 36.0–46.0)
Hemoglobin: 15.3 g/dL — ABNORMAL HIGH (ref 12.0–15.0)
MCH: 31.7 pg (ref 26.0–34.0)
MCHC: 32.7 g/dL (ref 30.0–36.0)
MCV: 96.9 fL (ref 80.0–100.0)
Platelets: 130 10*3/uL — ABNORMAL LOW (ref 150–400)
RBC: 4.83 MIL/uL (ref 3.87–5.11)
RDW: 12.7 % (ref 11.5–15.5)
WBC: 4.1 10*3/uL (ref 4.0–10.5)
nRBC: 0 % (ref 0.0–0.2)

## 2018-11-14 LAB — HEPATIC FUNCTION PANEL
ALBUMIN: 3.8 g/dL (ref 3.5–5.0)
ALT: 22 U/L (ref 0–44)
AST: 20 U/L (ref 15–41)
Alkaline Phosphatase: 61 U/L (ref 38–126)
Bilirubin, Direct: 0.1 mg/dL (ref 0.0–0.2)
Indirect Bilirubin: 0.4 mg/dL (ref 0.3–0.9)
Total Bilirubin: 0.5 mg/dL (ref 0.3–1.2)
Total Protein: 6.8 g/dL (ref 6.5–8.1)

## 2018-11-14 LAB — BASIC METABOLIC PANEL
Anion gap: 8 (ref 5–15)
BUN: 8 mg/dL (ref 6–20)
CHLORIDE: 98 mmol/L (ref 98–111)
CO2: 29 mmol/L (ref 22–32)
Calcium: 8.8 mg/dL — ABNORMAL LOW (ref 8.9–10.3)
Creatinine, Ser: 0.66 mg/dL (ref 0.44–1.00)
GFR calc Af Amer: >60 mL/min (ref >60)
GFR calc non Af Amer: >60 mL/min (ref >60)
Glucose, Bld: 101 mg/dL — ABNORMAL HIGH (ref 70–99)
POTASSIUM: 3.5 mmol/L (ref 3.5–5.1)
Sodium: 135 mmol/L (ref 135–145)

## 2018-11-14 LAB — I-STAT BETA HCG BLOOD, ED (MC, WL, AP ONLY): I-stat hCG, quantitative: 14 m[IU]/mL — ABNORMAL HIGH (ref <5)

## 2018-11-14 LAB — LIPASE, BLOOD: Lipase: 25 U/L (ref 11–51)

## 2018-11-14 LAB — I-STAT TROPONIN, ED: Troponin i, poc: 0 ng/mL (ref 0.00–0.08)

## 2018-11-14 LAB — D-DIMER, QUANTITATIVE: D-Dimer, Quant: 0.37 ug/mL-FEU (ref 0.00–0.50)

## 2018-11-14 MED ORDER — SODIUM CHLORIDE 0.9% FLUSH: 3.0000 mL | Freq: Once | INTRAVENOUS | Status: DC

## 2018-11-14 MED ORDER — ALBUTEROL SULFATE (2.5 MG/3ML) 0.083% IN NEBU
5.0000 mg | INHALATION_SOLUTION | Freq: Once | RESPIRATORY_TRACT | Status: AC
  Administered 2018-11-14: 5 mg via RESPIRATORY_TRACT
  Filled 2018-11-14: qty 6

## 2018-11-14 MED ORDER — METHYLPREDNISOLONE SODIUM SUCC 125 MG IJ SOLR
125.0000 mg | Freq: Once | INTRAMUSCULAR | Status: AC
  Administered 2018-11-14: 125 mg via INTRAVENOUS
  Filled 2018-11-14: qty 2

## 2018-11-14 NOTE — ED Notes (Signed)
Patient transported to X-ray 

## 2018-11-14 NOTE — ED Notes (Signed)
Patient verbalizes understanding of discharge instructions. Opportunity for questioning and answers were provided. Armband removed by staff, pt discharged from ED ambulatory.   

## 2018-11-14 NOTE — ED Triage Notes (Signed)
Pt c/o CP/SOB X1 day.

## 2018-11-14 NOTE — ED Provider Notes (Signed)
[facility] EMERGENCY DEPARTMENT Provider Note   CSN: [id number] Arrival date & time: 11/14/18  1932     History   Chief Complaint Chief Complaint  Patient presents with  . Shortness of Breath    HPI Stacey Herman is a 63 y.o. female.  Patient is a 63 year old female with a history of COPD who presents with shortness of breath.  She states she has been treated for pneumonia since around Christmas.  She initially took a Z-Pak and currently she is finishing up a course of Levaquin.  She is also on prednisone 10 mg daily.  She states that she is continuing a cough and continuing to have shortness of breath.  She also feels like when she takes a deep breath she feels congestion or blockage in her lower chest/epigastrium.  She denies any other abdominal pain.  No nausea or vomiting.  She does have a decreased appetite.  No known fevers.  No leg swelling or pain.  No history of VTE.  She has been using her nebulizer machines at home with some improvement in symptoms.     Past Medical History:  Diagnosis Date  . Acid reflux   . Aortic atherosclerosis ([place])   . Claustrophobia   . Claustrophobia    fears anesthesia and unable to have MRI unless open.  cannot tolerate head being confined.  . Complication of anesthesia    "fights against anesthesia"  . COPD (chronic obstructive pulmonary disease) ([place])   . DDD (degenerative disc disease), lumbar   . Headache    occ  . History of bronchitis   . History of iron deficiency anemia   . History of kidney stones   . Kidney stones   . Lumbar spondylosis   . Medical history non-contributory   . Renal disorder   . Wears dentures    upper and lower  . Wears glasses     Patient Active Problem List   Diagnosis Date Noted  . Hydronephrosis with renal and ureteral calculus obstruction 07/25/2018  . Hydronephrosis with renal and ureteral calculous obstruction 07/25/2018  . SIRS (systemic inflammatory response syndrome)  ([place]) 08/24/2017  . Elevated lactic acid level 08/24/2017  . Hypokalemia 08/24/2017  . Hyperglycemia 08/24/2017  . COPD (chronic obstructive pulmonary disease) ([place]) 08/23/2017  . Chest pain 07/20/2017    Past Surgical History:  Procedure Laterality Date  . APPENDECTOMY    . BACK SURGERY     x2  . CERVICAL FUSION     x3  . CYSTOSCOPY WITH STENT PLACEMENT Left 07/26/2018   Procedure: CYSTOSCOPY, LEFT RETROGRADE, WITH LEFT URETERAL STENT PLACEMENT;  Surgeon: Lucas Mallow, MD;  Location: [facility];  Service: Urology;  Laterality: Left;  . CYSTOSCOPY/URETEROSCOPY/HOLMIUM LASER/STENT PLACEMENT Left 08/05/2018   Procedure: CYSTOSCOPY LEFT /URETEROSCOPY/HOLMIUM LASER/STENT EXCHANGE;  Surgeon: Lucas Mallow, MD;  Location: [facility];  Service: Urology;  Laterality: Left;  . TOTAL HIP ARTHROPLASTY Left      OB History   No obstetric history on file.      Home Medications    Prior to Admission medications   Medication Sig Start Date End Date Taking? Authorizing Provider  acetaminophen (TYLENOL) 325 MG tablet Take 325-650 mg by mouth every 6 (six) hours as needed (for headaches or pain).    [provider]  albuterol (PROVENTIL HFA;VENTOLIN HFA) 108 (90 Base) MCG/ACT inhaler Inhale 2 puffs into the lungs every 6 (six) hours as needed for wheezing or shortness of  breath.    [provider]  albuterol (PROVENTIL) (2.5 MG/3ML) 0.083% nebulizer solution Take 3 mLs (2.5 mg total) by nebulization every 6 (six) hours as needed for wheezing. 08/26/17   Hosie Poisson, MD  cyclobenzaprine (FLEXERIL) 5 MG tablet Take 5 mg by mouth 3 (three) times daily as needed for muscle spasms.    [provider]  esomeprazole (NEXIUM) 20 MG capsule Take 20 mg by mouth daily as needed (acid reflux).     [provider]  Melatonin 3 MG CAPS Take 3 mg by mouth at bedtime as needed (sleep).     [provider]  Multiple Vitamin (MULTIVITAMIN WITH  MINERALS) TABS tablet Take 1 tablet by mouth daily.    [provider]  oxyCODONE-acetaminophen (PERCOCET) 10-325 MG tablet Take 1 tablet by mouth every 6 (six) hours as needed for pain. Patient taking differently: Take 1 tablet by mouth 3 (three) times daily.  01/30/17   Orlie Dakin, MD  traZODone (DESYREL) 50 MG tablet Take 1 tablet (50 mg total) by mouth at bedtime as needed for sleep. Patient not taking: Reported on 07/25/2018 07/21/17   Geradine Girt, DO    Family History Family History  Problem Relation Age of Onset  . Cancer Mother   . Heart attack Father   . Heart attack Paternal Grandmother     Social History Social History   Tobacco Use  . Smoking status: Current Every Day Smoker    Packs/day: 0.25    Years: 46.00    Pack years: 11.50    Types: Cigarettes  . Smokeless tobacco: Never Used  Substance Use Topics  . Alcohol use: Yes    Comment: rare  . Drug use: No     Allergies   Amoxicillin; Aspirin; Benadryl [diphenhydramine]; Ciprocin-fluocin-procin [fluocinolone]; Ciprofloxacin; Morphine and related; Mushroom extract complex; Penicillins; Sulfa antibiotics; and Bactrim [sulfamethoxazole-trimethoprim]   Review of Systems Review of Systems  Constitutional: Negative for chills, diaphoresis, fatigue and fever.  HENT: Negative for congestion, rhinorrhea and sneezing.   Eyes: Negative.   Respiratory: Positive for cough, shortness of breath and wheezing. Negative for chest tightness.   Cardiovascular: Positive for chest pain (Epigastric pain). Negative for leg swelling.  Gastrointestinal: Negative for abdominal pain, blood in stool, diarrhea, nausea and vomiting.  Genitourinary: Negative for difficulty urinating, flank pain, frequency and hematuria.  Musculoskeletal: Negative for arthralgias and back pain.  Skin: Negative for rash.  Neurological: Negative for dizziness, speech difficulty, weakness, numbness and headaches.     Physical Exam Updated  Vital Signs BP 106/79   Pulse 81   Temp 98.3 F (36.8 C) (Oral)   Resp 18   SpO2 99%   Physical Exam Constitutional:      Appearance: She is well-developed.  HENT:     Head: Normocephalic and atraumatic.  Eyes:     Pupils: Pupils are equal, round, and reactive to light.  Neck:     Musculoskeletal: Normal range of motion and neck supple.  Cardiovascular:     Rate and Rhythm: Normal rate and regular rhythm.     Heart sounds: Normal heart sounds.  Pulmonary:     Effort: Pulmonary effort is normal. No respiratory distress.     Breath sounds: Wheezing present. No rales.  Chest:     Chest wall: No tenderness.  Abdominal:     General: Bowel sounds are normal.     Palpations: Abdomen is soft.     Tenderness: There is abdominal tenderness in the epigastric  area. There is no guarding or rebound.  Musculoskeletal: Normal range of motion.     Comments: No edema or leg tenderness  Lymphadenopathy:     Cervical: No cervical adenopathy.  Skin:    General: Skin is warm and dry.     Findings: No rash.  Neurological:     Mental Status: She is alert and oriented to person, place, and time.      ED Treatments / Results  Labs (all labs ordered are listed, but only abnormal results are displayed) Labs Reviewed  BASIC METABOLIC PANEL - Abnormal; Notable for the following components:      Result Value   Glucose, Bld 101 (*)    Calcium 8.8 (*)    All other components within normal limits  CBC - Abnormal; Notable for the following components:   Hemoglobin 15.3 (*)    HCT 46.8 (*)    Platelets 130 (*)    All other components within normal limits  I-STAT BETA HCG BLOOD, ED ([facility], [facility], [facility] ONLY) - Abnormal; Notable for the following components:   I-stat hCG, quantitative 14.0 (*)    All other components within normal limits  LIPASE, BLOOD  D-DIMER, QUANTITATIVE (NOT AT [facility])  HEPATIC FUNCTION PANEL  I-STAT TROPONIN, ED    EKG EKG Interpretation  Date/Time:  Saturday November 14 2018  19:44:10 EST Ventricular Rate:  90 PR Interval:  168 QRS Duration: 88 QT Interval:  360 QTC Calculation: 440 R Axis:   61 Text Interpretation:  Normal sinus rhythm Septal infarct , age undetermined Possible Lateral infarct , age undetermined Abnormal ECG since last tracing no significant change Confirmed by Malvin Johns [phone]) on 11/14/2018 8:22:24 PM   Radiology Dg Chest 2 View  Result Date: 11/14/2018 CLINICAL DATA:  Chest pain and shortness of breath. History of pneumonia. Smoker. EXAM: CHEST - 2 VIEW COMPARISON:  10/12/2018 FINDINGS: Hyperinflation. Patient rotated minimally left on the frontal. Midline trachea. Normal heart size and mediastinal contours. No pleural effusion or pneumothorax. Upper lobe predominant interstitial thickening, without lobar consolidation. IMPRESSION: COPD/chronic bronchitis. No acute superimposed process. Electronically Signed   By: Abigail Miyamoto M.D.   On: 11/14/2018 20:24    Procedures Procedures (including critical care time)  Medications Ordered in ED Medications  sodium chloride flush (NS) 0.9 % injection 3 mL (3 mLs Intravenous Not Given 11/14/18 2045)  albuterol (PROVENTIL) (2.5 MG/3ML) 0.083% nebulizer solution 5 mg (5 mg Nebulization Given 11/14/18 2023)  methylPREDNISolone sodium succinate (SOLU-MEDROL) 125 mg/2 mL injection 125 mg (125 mg Intravenous Given 11/14/18 2056)     Initial Impression / Assessment and Plan / ED Course  I have reviewed the triage vital signs and the nursing notes.  Pertinent labs & imaging results that were available during my care of the patient were reviewed by me and considered in my medical decision making (see chart for details).     Patient is a 63 year old female who presents with wheezing and shortness of breath.  Chest x-ray shows no acute abnormality.  No evidence of pneumonia.  She was given nebulizer treatment here and a dose of Solu-Medrol.  She has had no hypoxia.  Her breathing has improved.  Her  wheezing had improved after the nebulizer treatment.  She has had some discomfort to her epigastrium which she describes as a muscle spasm type pain.  This may be related to the coughing that she has been having.  Her EKG does not show any ischemic changes.  Her troponin is negative.  She does not have other symptoms that sound more concerning for ACS.  I check some labs including a lipase and a CMP which shows no concerning findings for pancreatitis or liver inflammation.  She does not have any pain over her gallbladder.  She has no other abdominal pain.  I do not feel that she needs imaging currently.  She will continue her course of prednisone and Levaquin that was prescribed by her PCP.  She was encouraged to have close follow-up with her PCP.  Return precautions were given.  Of note, she was very upset about moving out to the hallway after her evaluation in her room.  She made a lot of complaints about that and said that she was 63 years old and deserves to be in her room.  She does have privacy curtains around her bed but she was concerned about being exposed everyone.  I did try to explain that unfortunately at times we have to complete her evaluation in the hallway and I tried to assure her that we were giving her the same care as if she were in her room.  Final Clinical Impressions(s) / ED Diagnoses   Final diagnoses:  COPD exacerbation [facility])    ED Discharge Orders    None       Malvin Johns, MD 11/14/18 2236

## 2018-11-14 NOTE — ED Notes (Signed)
Pt complaining loudly about being in the hallway while on the phone with her daughter. This RN went to explain process again to pt who asked this RN to speak with her daughter on the phone. Explained to daughter the protocol, assured her the patient's care is not changing, HIPAA is not being violated, and offered to read her the policy or opportunity to speak with the charge nurse. Pt's daughter declined those offers and said she would call the superintendent of the hospital.

## 2019-05-19 ENCOUNTER — Emergency Department (HOSPITAL_COMMUNITY): Payer: Medicare Other

## 2019-05-19 ENCOUNTER — Emergency Department (HOSPITAL_COMMUNITY)
Admission: EM | Admit: 2019-05-19 | Discharge: 2019-05-19 | Disposition: A | Discharge status: Home / Self Care | Payer: Medicare Other | Attending: Emergency Medicine | Admitting: Emergency Medicine

## 2019-05-19 ENCOUNTER — Other Ambulatory Visit: Payer: Self-pay

## 2019-05-19 DIAGNOSIS — R51 Headache: Secondary | ICD-10-CM | POA: Diagnosis present

## 2019-05-19 DIAGNOSIS — J449 Chronic obstructive pulmonary disease, unspecified: Secondary | ICD-10-CM | POA: Insufficient documentation

## 2019-05-19 DIAGNOSIS — Z96642 Presence of left artificial hip joint: Secondary | ICD-10-CM | POA: Diagnosis not present

## 2019-05-19 DIAGNOSIS — F17210 Nicotine dependence, cigarettes, uncomplicated: Secondary | ICD-10-CM | POA: Insufficient documentation

## 2019-05-19 DIAGNOSIS — R519 Headache, unspecified: Secondary | ICD-10-CM

## 2019-05-19 DIAGNOSIS — R599 Enlarged lymph nodes, unspecified: Secondary | ICD-10-CM

## 2019-05-19 LAB — DIFFERENTIAL
Abs Immature Granulocytes: 0.02 10*3/uL (ref 0.00–0.07)
Basophils Absolute: 0 10*3/uL (ref 0.0–0.1)
Basophils Relative: 0 %
Eosinophils Absolute: 0.1 10*3/uL (ref 0.0–0.5)
Eosinophils Relative: 2 %
Immature Granulocytes: 0 %
Lymphocytes Relative: 23 %
Lymphs Abs: 1.6 10*3/uL (ref 0.7–4.0)
Monocytes Absolute: 0.5 10*3/uL (ref 0.1–1.0)
Monocytes Relative: 8 %
Neutro Abs: 4.5 10*3/uL (ref 1.7–7.7)
Neutrophils Relative %: 67 %

## 2019-05-19 LAB — CBC
HCT: 47.1 % — ABNORMAL HIGH (ref 36.0–46.0)
Hemoglobin: 15.8 g/dL — ABNORMAL HIGH (ref 12.0–15.0)
MCH: 33.7 pg (ref 26.0–34.0)
MCHC: 33.5 g/dL (ref 30.0–36.0)
MCV: 100.4 fL — ABNORMAL HIGH (ref 80.0–100.0)
Platelets: 185 10*3/uL (ref 150–400)
RBC: 4.69 MIL/uL (ref 3.87–5.11)
RDW: 12.8 % (ref 11.5–15.5)
WBC: 6.8 10*3/uL (ref 4.0–10.5)
nRBC: 0 % (ref 0.0–0.2)

## 2019-05-19 LAB — COMPREHENSIVE METABOLIC PANEL
ALT: 14 U/L (ref 0–44)
AST: 16 U/L (ref 15–41)
Albumin: 3.8 g/dL (ref 3.5–5.0)
Alkaline Phosphatase: 81 U/L (ref 38–126)
Anion gap: 10 (ref 5–15)
BUN: 7 mg/dL — ABNORMAL LOW (ref 8–23)
CO2: 27 mmol/L (ref 22–32)
Calcium: 9 mg/dL (ref 8.9–10.3)
Chloride: 98 mmol/L (ref 98–111)
Creatinine, Ser: 0.73 mg/dL (ref 0.44–1.00)
GFR calc Af Amer: >60 mL/min (ref >60)
GFR calc non Af Amer: >60 mL/min (ref >60)
Glucose, Bld: 109 mg/dL — ABNORMAL HIGH (ref 70–99)
Potassium: 3.8 mmol/L (ref 3.5–5.1)
Sodium: 135 mmol/L (ref 135–145)
Total Bilirubin: 0.5 mg/dL (ref 0.3–1.2)
Total Protein: 6.6 g/dL (ref 6.5–8.1)

## 2019-05-19 LAB — PROTIME-INR
INR: 1 (ref 0.8–1.2)
Prothrombin Time: 13.3 seconds (ref 11.4–15.2)

## 2019-05-19 LAB — APTT: aPTT: 31 seconds (ref 24–36)

## 2019-05-19 MED ORDER — SODIUM CHLORIDE 0.9% FLUSH
3.0000 mL | Freq: Once | INTRAVENOUS | Status: DC
Start: 2019-05-19 — End: 2019-05-20

## 2019-05-19 MED ORDER — KETOROLAC TROMETHAMINE 30 MG/ML IJ SOLN
30.0000 mg | Freq: Once | INTRAMUSCULAR | Status: AC
  Administered 2019-05-19: 30 mg via INTRAVENOUS
  Filled 2019-05-19: qty 1

## 2019-05-19 MED ORDER — PROCHLORPERAZINE EDISYLATE 10 MG/2ML IJ SOLN
10.0000 mg | Freq: Once | INTRAMUSCULAR | Status: AC
  Administered 2019-05-19: 10 mg via INTRAVENOUS
  Filled 2019-05-19: qty 2

## 2019-05-19 MED ORDER — IOHEXOL 350 MG/ML SOLN
75.0000 mL | Freq: Once | INTRAVENOUS | Status: AC | PRN
  Administered 2019-05-19: 20:00:00 75 mL via INTRAVENOUS

## 2019-05-19 NOTE — ED Notes (Signed)
To ct now.

## 2019-05-19 NOTE — ED Triage Notes (Signed)
Pt reports headache to R side, R side facial numbness, blurry vision to R eye, and fatigue since Saturday. Pt denies weakness to one side of body. Pt also reports feeling a lump in her throat, difficulty swallowing, has been eating soft foods. Denies sore throat.

## 2019-05-19 NOTE — ED Notes (Signed)
The pt has a knot on her rt neck for several days  It is getting bigger and she has rt face pain   Rt face is numb and she has a headache  No temp

## 2019-05-19 NOTE — Discharge Instructions (Addendum)
Your CT scan today showed enlargement of the lymph node on your neck that is concerning for possible cancer.  We discussed the case with our ear nose and throat doctor, Dr. Benjamine Mola.  He recommended that you follow-up in his office in the next week or so so that you can get a biopsy of this lymph node.  Please call his office tomorrow to schedule an appointment for follow-up.  If you have any symptoms in the meantime including difficulty swallowing, throat swelling, shortness of breath, chest pain, severe headaches, numbness/weakness to one side please return to the emergency department immediately.

## 2019-05-19 NOTE — ED Provider Notes (Signed)
[facility] EMERGENCY DEPARTMENT Provider Note   CSN: [id number] Arrival date & time: 05/19/19  1632    History   Chief Complaint Chief Complaint  Patient presents with   Numbness   Headache    HPI Stacey Herman is a 63 y.o. female.     HPI   63 year old female with a history of GERD aortic atherosclerosis, COPD, DDD, nephrolithiasis, who presents to the emergency department today complaining of headache.  States she began to have right-sided headache that started gradually yesterday.  It has worsened since onset.  Pain is severe and constant in nature.  Describes it as a throbbing pain.  She has tried Tylenol and Percocet as well which did not improve her symptoms.  She is complaining of blurred vision to the right eye and numbness to the right side of the face.  No jaw claudication.  Does have some sensitivity to the light.  Reports she has a "knot "to the right side of her neck.  Denies any sore throat, difficulty breathing or swallowing.  No fevers.  No unilateral numbness/weakness.  No increased cough, shortness of breath or chest pain.  No recent falls or trauma.  Past Medical History:  Diagnosis Date   Acid reflux    Aortic atherosclerosis ([place])    Claustrophobia    Claustrophobia    fears anesthesia and unable to have MRI unless open.  cannot tolerate head being confined.   Complication of anesthesia    "fights against anesthesia"   COPD (chronic obstructive pulmonary disease) (HCC)    DDD (degenerative disc disease), lumbar    Headache    occ   History of bronchitis    History of iron deficiency anemia    History of kidney stones    Kidney stones    Lumbar spondylosis    Medical history non-contributory    Renal disorder    Wears dentures    upper and lower   Wears glasses     Patient Active Problem List   Diagnosis Date Noted   Hydronephrosis with renal and ureteral calculus obstruction 07/25/2018    Hydronephrosis with renal and ureteral calculous obstruction 07/25/2018   SIRS (systemic inflammatory response syndrome) ([place]) 08/24/2017   Elevated lactic acid level 08/24/2017   Hypokalemia 08/24/2017   Hyperglycemia 08/24/2017   COPD (chronic obstructive pulmonary disease) ([place]) 08/23/2017   Chest pain 07/20/2017    Past Surgical History:  Procedure Laterality Date   APPENDECTOMY     BACK SURGERY     x2   CERVICAL FUSION     x3   CYSTOSCOPY WITH STENT PLACEMENT Left 07/26/2018   Procedure: CYSTOSCOPY, LEFT RETROGRADE, WITH LEFT URETERAL STENT PLACEMENT;  Surgeon: Lucas Mallow, MD;  Location: [facility];  Service: Urology;  Laterality: Left;   CYSTOSCOPY/URETEROSCOPY/HOLMIUM LASER/STENT PLACEMENT Left 08/05/2018   Procedure: CYSTOSCOPY LEFT /URETEROSCOPY/HOLMIUM LASER/STENT EXCHANGE;  Surgeon: Lucas Mallow, MD;  Location: [facility];  Service: Urology;  Laterality: Left;   TOTAL HIP ARTHROPLASTY Left      OB History   No obstetric history on file.      Home Medications    Prior to Admission medications   Medication Sig Start Date End Date Taking? Authorizing Provider  acetaminophen (TYLENOL) 325 MG tablet Take 325-650 mg by mouth every 6 (six) hours as needed (for headaches or pain).   Yes [provider]  albuterol (PROVENTIL HFA;VENTOLIN HFA) 108 (90 Base) MCG/ACT inhaler Inhale 2 puffs into the  lungs every 6 (six) hours as needed for wheezing or shortness of breath.   Yes [provider]  albuterol (PROVENTIL) (2.5 MG/3ML) 0.083% nebulizer solution Take 3 mLs (2.5 mg total) by nebulization every 6 (six) hours as needed for wheezing. 08/26/17  Yes Hosie Poisson, MD  esomeprazole (NEXIUM) 20 MG capsule Take 20 mg by mouth daily as needed (acid reflux).    Yes [provider]  Multiple Vitamin (MULTIVITAMIN WITH MINERALS) TABS tablet Take 1 tablet by mouth daily.   Yes [provider]    oxyCODONE-acetaminophen (PERCOCET) 10-325 MG tablet Take 1 tablet by mouth every 6 (six) hours as needed for pain. Patient taking differently: Take 1 tablet by mouth 3 (three) times daily.  01/30/17  Yes Orlie Dakin, MD    Family History Family History  Problem Relation Age of Onset   Cancer Mother    Heart attack Father    Heart attack Paternal Grandmother     Social History Social History   Tobacco Use   Smoking status: Current Every Day Smoker    Packs/day: 0.25    Years: 46.00    Pack years: 11.50    Types: Cigarettes   Smokeless tobacco: Never Used  Substance Use Topics   Alcohol use: Yes    Comment: rare   Drug use: No     Allergies   Amoxicillin, Aspirin, Benadryl [diphenhydramine], Ciprocin-fluocin-procin [fluocinolone], Ciprofloxacin, Morphine and related, Mushroom extract complex, Penicillins, Sulfa antibiotics, and Bactrim [sulfamethoxazole-trimethoprim]   Review of Systems Review of Systems  Constitutional: Negative for chills and fever.  HENT: Negative for ear pain and sore throat.   Eyes: Positive for photophobia and visual disturbance. Negative for pain.  Respiratory: Negative for cough and shortness of breath.   Cardiovascular: Negative for chest pain.  Gastrointestinal: Negative for abdominal pain, constipation, diarrhea, nausea and vomiting.  Genitourinary: Negative for dysuria and hematuria.  Musculoskeletal: Negative for back pain and neck pain.  Skin: Negative for rash.  Neurological: Positive for headaches. Negative for dizziness, weakness, light-headedness and numbness.  All other systems reviewed and are negative.    Physical Exam Updated Vital Signs BP 131/83    Pulse 75    Temp 98.5 F (36.9 C) (Oral)    Resp 19    SpO2 94%   Physical Exam Vitals signs and nursing note reviewed.  Constitutional:      General: She is not in acute distress.    Appearance: She is well-developed. She is not ill-appearing or toxic-appearing.   HENT:     Head: Normocephalic and atraumatic.  Eyes:     Conjunctiva/sclera: Conjunctivae normal.  Neck:     Musculoskeletal: Neck supple.     Comments: Negative carotid bruits bilaterally Cardiovascular:     Rate and Rhythm: Normal rate and regular rhythm.     Heart sounds: No murmur.  Pulmonary:     Effort: Pulmonary effort is normal. No respiratory distress.     Breath sounds: Normal breath sounds. No wheezing, rhonchi or rales.  Abdominal:     General: Bowel sounds are normal. There is no distension.     Palpations: Abdomen is soft.     Tenderness: There is no abdominal tenderness. There is no guarding.  Lymphadenopathy:     Cervical: Cervical adenopathy (right) present.  Skin:    General: Skin is warm and dry.  Neurological:     Mental Status: She is alert.     Comments: Mental Status:  Alert, thought content appropriate, able to  give a coherent history. Speech fluent without evidence of aphasia. Able to follow 2 step commands without difficulty.  Cranial Nerves:  II:  pupils equal, round, reactive to light III,IV, VI: ptosis not present, extra-ocular motions intact bilaterally  V,VII: smile symmetric, decreased sensation to the right side of the face VIII: hearing grossly normal to voice  X: uvula elevates symmetrically  XI: bilateral shoulder shrug symmetric and strong XII: midline tongue extension without fassiculations Motor:  Normal tone. 5/5 strength of BUE and BLE major muscle groups including strong and equal grip strength and dorsiflexion/plantar flexion Sensory: decreased sensation to the RUE, normal sensation throughout Cerebellar: normal finger-to-nose with bilateral upper extremities Gait: normal gait and balance. Able to walk on toes and heels with ease.  CV: 2+ radial and DP pulses      ED Treatments / Results  Labs (all labs ordered are listed, but only abnormal results are displayed) Labs Reviewed  CBC - Abnormal; Notable for the following  components:      Result Value   Hemoglobin 15.8 (*)    HCT 47.1 (*)    MCV 100.4 (*)    All other components within normal limits  COMPREHENSIVE METABOLIC PANEL - Abnormal; Notable for the following components:   Glucose, Bld 109 (*)    BUN 7 (*)    All other components within normal limits  PROTIME-INR  APTT  DIFFERENTIAL    EKG EKG Interpretation  Date/Time:  Wednesday May 19 2019 17:19:41 EDT Ventricular Rate:  90 PR Interval:  176 QRS Duration: 90 QT Interval:  348 QTC Calculation: 425 R Axis:   72 Text Interpretation:  Normal sinus rhythm Right atrial enlargement Borderline ECG No significant change since last tracing Confirmed by Deno Etienne [phone]) on 05/19/2019 5:52:59 PM   Radiology Ct Angio Head W Or Wo Contrast  Result Date: 05/19/2019 CLINICAL DATA:  Right-sided headache. Right facial numbness. Rule out stroke. EXAM: CT ANGIOGRAPHY HEAD AND NECK TECHNIQUE: Multidetector CT imaging of the head and neck was performed using the standard protocol during bolus administration of intravenous contrast. Multiplanar CT image reconstructions and MIPs were obtained to evaluate the vascular anatomy. Carotid stenosis measurements (when applicable) are obtained utilizing NASCET criteria, using the distal internal carotid diameter as the denominator. CONTRAST:  35mL OMNIPAQUE IOHEXOL 350 MG/ML SOLN COMPARISON:  CT head 05/19/2019 FINDINGS: CTA NECK FINDINGS Aortic arch: Minimal atherosclerotic disease in the aortic arch. Proximal great vessels widely patent. Right carotid system: Right common carotid artery widely patent. Mild atherosclerotic calcification right carotid bifurcation without significant stenosis. Left carotid system: Left common carotid artery widely patent. Mild atherosclerotic disease left carotid bifurcation without significant stenosis. Vertebral arteries: Both vertebral arteries patent to the basilar without significant stenosis. Skeleton: ACDF with solid fusion C3  through C6. No acute skeletal abnormality. Other neck: Enlarged lymph nodes right neck. Level 2 lymph node with heterogeneous enhancement and central necrosis. Lymph node measures 20 x 29 mm and is suspicious for metastatic disease. Mild enlargement of the tonsils bilaterally with bilateral tonsilliths. No definite pharyngeal mucosal mass however direct mucosal evaluation is recommended. Upper chest: Lung apices clear bilaterally. Review of the MIP images confirms the above findings CTA HEAD FINDINGS Anterior circulation: Cavernous carotid widely patent bilaterally without stenosis. Anterior and middle cerebral arteries widely patent bilaterally. Posterior circulation: Both vertebral arteries patent to the basilar. PICA patent bilaterally. Basilar widely patent. AICA, superior cerebellar, and posterior cerebral arteries patent bilaterally without significant stenosis. Venous sinuses: Patent Anatomic variants: None Review  of the MIP images confirms the above findings IMPRESSION: 1. Negative for emergent intracranial large vessel occlusion. No significant intracranial stenosis 2. Mild atherosclerotic disease carotid bifurcation without significant stenosis. Both vertebral arteries widely patent. 3. 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 Recommend ENT consultation. Electronically Signed   By: Franchot Gallo M.D.   On: 05/19/2019 20:42   Ct Head Wo Contrast  Result Date: 05/19/2019 CLINICAL DATA:  Severe right-sided headache with right facial numbness and right eye blurry vision since Saturday. EXAM: CT HEAD WITHOUT CONTRAST TECHNIQUE: Contiguous axial images were obtained from the base of the skull through the vertex without intravenous contrast. COMPARISON:  None. FINDINGS: Brain: No evidence of acute infarction, hemorrhage, hydrocephalus, extra-axial collection or mass lesion/mass effect. Vascular: No hyperdense vessel or unexpected calcification. Skull: Normal.  Negative for fracture or focal lesion. Sinuses/Orbits: No acute finding. Other: None. IMPRESSION: 1.  No acute intracranial abnormality. Electronically Signed   By: Titus Dubin M.D.   On: 05/19/2019 17:58   Ct Angio Neck W And/or Wo Contrast  Result Date: 05/19/2019 CLINICAL DATA:  Right-sided headache. Right facial numbness. Rule out stroke. EXAM: CT ANGIOGRAPHY HEAD AND NECK TECHNIQUE: Multidetector CT imaging of the head and neck was performed using the standard protocol during bolus administration of intravenous contrast. Multiplanar CT image reconstructions and MIPs were obtained to evaluate the vascular anatomy. Carotid stenosis measurements (when applicable) are obtained utilizing NASCET criteria, using the distal internal carotid diameter as the denominator. CONTRAST:  9mL OMNIPAQUE IOHEXOL 350 MG/ML SOLN COMPARISON:  CT head 05/19/2019 FINDINGS: CTA NECK FINDINGS Aortic arch: Minimal atherosclerotic disease in the aortic arch. Proximal great vessels widely patent. Right carotid system: Right common carotid artery widely patent. Mild atherosclerotic calcification right carotid bifurcation without significant stenosis. Left carotid system: Left common carotid artery widely patent. Mild atherosclerotic disease left carotid bifurcation without significant stenosis. Vertebral arteries: Both vertebral arteries patent to the basilar without significant stenosis. Skeleton: ACDF with solid fusion C3 through C6. No acute skeletal abnormality. Other neck: Enlarged lymph nodes right neck. Level 2 lymph node with heterogeneous enhancement and central necrosis. Lymph node measures 20 x 29 mm and is suspicious for metastatic disease. Mild enlargement of the tonsils bilaterally with bilateral tonsilliths. No definite pharyngeal mucosal mass however direct mucosal evaluation is recommended. Upper chest: Lung apices clear bilaterally. Review of the MIP images confirms the above findings CTA HEAD FINDINGS Anterior  circulation: Cavernous carotid widely patent bilaterally without stenosis. Anterior and middle cerebral arteries widely patent bilaterally. Posterior circulation: Both vertebral arteries patent to the basilar. PICA patent bilaterally. Basilar widely patent. AICA, superior cerebellar, and posterior cerebral arteries patent bilaterally without significant stenosis. Venous sinuses: Patent Anatomic variants: None Review of the MIP images confirms the above findings IMPRESSION: 1. Negative for emergent intracranial large vessel occlusion. No significant intracranial stenosis 2. Mild atherosclerotic disease carotid bifurcation without significant stenosis. Both vertebral arteries widely patent. 3. 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 Recommend ENT consultation. Electronically Signed   By: Franchot Gallo M.D.   On: 05/19/2019 20:42    Procedures Procedures (including critical care time)  Medications Ordered in ED Medications  sodium chloride flush (NS) 0.9 % injection 3 mL (has no administration in time range)  ketorolac (TORADOL) 30 MG/ML injection 30 mg (30 mg Intravenous Given 05/19/19 1925)  prochlorperazine (COMPAZINE) injection 10 mg (10 mg Intravenous Given 05/19/19 1925)  iohexol (OMNIPAQUE) 350 MG/ML injection 75 mL (75 mLs  Intravenous Contrast Given 05/19/19 2022)     Initial Impression / Assessment and Plan / ED Course  I have reviewed the triage vital signs and the nursing notes.  Pertinent labs & imaging results that were available during my care of the patient were reviewed by me and considered in my medical decision making (see chart for details).  Final Clinical Impressions(s) / ED Diagnoses   Final diagnoses:  Acute nonintractable headache, unspecified headache type  Enlargement of lymph node   63 year old female presenting for evaluation of right-sided headache, visual changes and right-sided facial numbness/tingling.  VSS  reassuring.  Patient has no focal weakness on exam.  She does have some sensory changes to the right side of the face and right arm.  No sensory changes to the right leg.  No facial droop.  Clear speech.  Cranial nerves II through XII intact.  Labs reviewed CBC without leukocytosis, hemoglobin elevated. CMP nonacute Coags are normal  EKG with Normal sinus rhythm Right atrial enlargement Borderline ECG No significant change since last tracing   CT head with no acute intracranial abnormality.   CTA head/neck: Negative for emergent intracranial large vessel occlusion. No significant intracranial stenosis 2. Mild atherosclerotic disease carotid bifurcation without significant stenosis. Both vertebral arteries widely patent. 3. 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 Recommend ENT consultation.   9:28 PM CONSULT with Dr. Benjamine Mola with ENT. He states that patient will need to f/u in the office this week for eval and she will likely need a biopsy.   Reassessed the patient.  She states that her headache is much improved.  She no longer has any numbness to the right side of her face.  She has no numbness to the right arm either.  She states that her visual changes have resolved.  Visual acuity equal bilaterally.  CT imaging of the brain is reassuring.  Her symptoms seem that they may be related to migraine.  Seems less likely to be stroke. she is very eager for discharge.  I discussed the plan for follow-up with ENT.  She voices understanding of the plan and reasons to return.  All questions answered.  Patient stable for discharge.  ED Discharge Orders    None       Bishop Dublin 05/19/19 2153    Deno Etienne, DO 05/19/19 2214

## 2019-05-19 NOTE — ED Notes (Signed)
Pt feels nervous and jittery  Up to the br with assistance

## 2019-05-19 NOTE — ED Notes (Signed)
One unsuccessful attempt to start an iv 

## 2019-05-19 NOTE — ED Notes (Signed)
Headache some better but not gone and she is still jittery

## 2019-05-31 ENCOUNTER — Other Ambulatory Visit: Payer: Self-pay | Admitting: Otolaryngology

## 2019-05-31 ENCOUNTER — Other Ambulatory Visit (HOSPITAL_COMMUNITY): Payer: Self-pay | Admitting: Otolaryngology

## 2019-05-31 DIAGNOSIS — R221 Localized swelling, mass and lump, neck: Secondary | ICD-10-CM

## 2019-06-03 ENCOUNTER — Other Ambulatory Visit: Payer: Self-pay | Admitting: Radiology

## 2019-06-04 ENCOUNTER — Other Ambulatory Visit: Payer: Self-pay | Admitting: Radiology

## 2019-06-07 ENCOUNTER — Other Ambulatory Visit: Payer: Self-pay

## 2019-06-07 ENCOUNTER — Encounter (HOSPITAL_COMMUNITY): Payer: Self-pay

## 2019-06-07 ENCOUNTER — Ambulatory Visit (HOSPITAL_COMMUNITY)
Admission: RE | Admit: 2019-06-07 | Discharge: 2019-06-07 | Disposition: A | Discharge status: Home / Self Care | Payer: Medicare Other | Source: Ambulatory Visit | Attending: Otolaryngology | Admitting: Otolaryngology

## 2019-06-07 DIAGNOSIS — F17210 Nicotine dependence, cigarettes, uncomplicated: Secondary | ICD-10-CM | POA: Diagnosis not present

## 2019-06-07 DIAGNOSIS — K219 Gastro-esophageal reflux disease without esophagitis: Secondary | ICD-10-CM | POA: Insufficient documentation

## 2019-06-07 DIAGNOSIS — J449 Chronic obstructive pulmonary disease, unspecified: Secondary | ICD-10-CM | POA: Diagnosis not present

## 2019-06-07 DIAGNOSIS — Z87442 Personal history of urinary calculi: Secondary | ICD-10-CM | POA: Insufficient documentation

## 2019-06-07 DIAGNOSIS — Z885 Allergy status to narcotic agent status: Secondary | ICD-10-CM | POA: Diagnosis not present

## 2019-06-07 DIAGNOSIS — Z882 Allergy status to sulfonamides status: Secondary | ICD-10-CM | POA: Diagnosis not present

## 2019-06-07 DIAGNOSIS — Z886 Allergy status to analgesic agent status: Secondary | ICD-10-CM | POA: Insufficient documentation

## 2019-06-07 DIAGNOSIS — R221 Localized swelling, mass and lump, neck: Secondary | ICD-10-CM | POA: Diagnosis present

## 2019-06-07 DIAGNOSIS — Z880 Allergy status to penicillin: Secondary | ICD-10-CM | POA: Insufficient documentation

## 2019-06-07 DIAGNOSIS — Z981 Arthrodesis status: Secondary | ICD-10-CM | POA: Diagnosis not present

## 2019-06-07 DIAGNOSIS — Z8249 Family history of ischemic heart disease and other diseases of the circulatory system: Secondary | ICD-10-CM | POA: Insufficient documentation

## 2019-06-07 DIAGNOSIS — C801 Malignant (primary) neoplasm, unspecified: Secondary | ICD-10-CM | POA: Insufficient documentation

## 2019-06-07 DIAGNOSIS — Z79899 Other long term (current) drug therapy: Secondary | ICD-10-CM | POA: Diagnosis not present

## 2019-06-07 DIAGNOSIS — C770 Secondary and unspecified malignant neoplasm of lymph nodes of head, face and neck: Secondary | ICD-10-CM | POA: Diagnosis not present

## 2019-06-07 DIAGNOSIS — Z888 Allergy status to other drugs, medicaments and biological substances status: Secondary | ICD-10-CM | POA: Insufficient documentation

## 2019-06-07 DIAGNOSIS — Z96642 Presence of left artificial hip joint: Secondary | ICD-10-CM | POA: Insufficient documentation

## 2019-06-07 LAB — PROTIME-INR
INR: 1 (ref 0.8–1.2)
Prothrombin Time: 13.3 seconds (ref 11.4–15.2)

## 2019-06-07 LAB — CBC
HCT: 49.2 % — ABNORMAL HIGH (ref 36.0–46.0)
Hemoglobin: 16.8 g/dL — ABNORMAL HIGH (ref 12.0–15.0)
MCH: 34.2 pg — ABNORMAL HIGH (ref 26.0–34.0)
MCHC: 34.1 g/dL (ref 30.0–36.0)
MCV: 100.2 fL — ABNORMAL HIGH (ref 80.0–100.0)
Platelets: 173 10*3/uL (ref 150–400)
RBC: 4.91 MIL/uL (ref 3.87–5.11)
RDW: 12.8 % (ref 11.5–15.5)
WBC: 6.5 10*3/uL (ref 4.0–10.5)
nRBC: 0 % (ref 0.0–0.2)

## 2019-06-07 MED ORDER — FENTANYL CITRATE (PF) 100 MCG/2ML IJ SOLN
INTRAMUSCULAR | Status: AC
  Filled 2019-06-07: qty 2

## 2019-06-07 MED ORDER — LIDOCAINE HCL (PF) 1 % IJ SOLN
INTRAMUSCULAR | Status: AC
  Filled 2019-06-07: qty 30

## 2019-06-07 MED ORDER — MIDAZOLAM HCL 2 MG/2ML IJ SOLN
INTRAMUSCULAR | Status: AC
  Filled 2019-06-07: qty 2

## 2019-06-07 MED ORDER — SODIUM CHLORIDE 0.9 % IV SOLN: INTRAVENOUS | Status: DC

## 2019-06-07 MED ORDER — MIDAZOLAM HCL 2 MG/2ML IJ SOLN
INTRAMUSCULAR | Status: AC | PRN
  Administered 2019-06-07 (×2): 1 mg via INTRAVENOUS

## 2019-06-07 MED ORDER — FENTANYL CITRATE (PF) 100 MCG/2ML IJ SOLN
INTRAMUSCULAR | Status: AC | PRN
  Administered 2019-06-07 (×2): 50 ug via INTRAVENOUS

## 2019-06-07 NOTE — Procedures (Signed)
Interventional Radiology Procedure:   Indications: Enlarged right neck lymph node  Procedure: US guided core biopsy  Findings: Enlarged partially necrotic lymph node.  Core biopsies obtained.   Complications: None     EBL: less than 10 ml  Plan: Discharge to home in one hour.     Lauro Manlove R. Anselm Pancoast, MD  Pager: [phone]

## 2019-06-07 NOTE — Discharge Instructions (Addendum)
Needle Biopsy, Care After °This sheet gives you information about how to care for yourself after your procedure. Your health care provider may also give you more specific instructions. If you have problems or questions, contact your health care provider. °What can I expect after the procedure? °After the procedure, it is common to have soreness, bruising, or mild pain at the puncture site. This should go away in a few days. °Follow these instructions at home: °Needle insertion site care ° °· Wash your hands with soap and water before you change your bandage (dressing). If you cannot use soap and water, use hand sanitizer. °· Follow instructions from your health care provider about how to take care of your puncture site. This includes: °? When and how to change your dressing. °? When to remove your dressing. °· Check your puncture site every day for signs of infection. Check for: °? Redness, swelling, or pain. °? Fluid or blood. °? Pus or a bad smell. °? Warmth. °General instructions °· Return to your normal activities as told by your health care provider. Ask your health care provider what activities are safe for you. °· Do not take baths, swim, or use a hot tub until your health care provider approves. Ask your health care provider if you may take showers. You may only be allowed to take sponge baths. °· Take over-the-counter and prescription medicines only as told by your health care provider. °· Keep all follow-up visits as told by your health care provider. This is important. °Contact a health care provider if: °· You have a fever. °· You have redness, swelling, or pain at the puncture site that lasts longer than a few days. °· You have fluid, blood, or pus coming from your puncture site. °· Your puncture site feels warm to the touch. °Get help right away if: °· You have severe bleeding from the puncture site. °Summary °· After the procedure, it is common to have soreness, bruising, or mild pain at the puncture  site. This should go away in a few days. °· Check your puncture site every day for signs of infection, such as redness, swelling, or pain. °· Get help right away if you have severe bleeding from your puncture site. °This information is not intended to replace advice given to you by your health care provider. Make sure you discuss any questions you have with your health care provider. °Document Released: 02/28/2015 Document Revised: 12/26/2017 Document Reviewed: 10/27/2017 °Elsevier Patient Education © 2020 [employer] °Moderate Conscious Sedation, Adult, Care After °These instructions provide you with information about caring for yourself after your procedure. Your health care provider may also give you more specific instructions. Your treatment has been planned according to current medical practices, but problems sometimes occur. Call your health care provider if you have any problems or questions after your procedure. °What can I expect after the procedure? °After your procedure, it is common: °· To feel sleepy for several hours. °· To feel clumsy and have poor balance for several hours. °· To have poor judgment for several hours. °· To vomit if you eat too soon. °Follow these instructions at home: °For at least 24 hours after the procedure: ° °· Do not: °? Participate in activities where you could fall or become injured. °? Drive. °? Use heavy machinery. °? Drink alcohol. °? Take sleeping pills or medicines that cause drowsiness. °? Make important decisions or sign legal documents. °? Take care of children on your own. °· Rest. °Eating and   drinking °· Follow the diet recommended by your health care provider. °· If you vomit: °? Drink water, juice, or soup when you can drink without vomiting. °? Make sure you have little or no nausea before eating solid foods. °General instructions °· Have a responsible adult stay with you until you are awake and alert. °· Take over-the-counter and prescription medicines only as  told by your health care provider. °· If you smoke, do not smoke without supervision. °· Keep all follow-up visits as told by your health care provider. This is important. °Contact a health care provider if: °· You keep feeling nauseous or you keep vomiting. °· You feel light-headed. °· You develop a rash. °· You have a fever. °Get help right away if: °· You have trouble breathing. °This information is not intended to replace advice given to you by your health care provider. Make sure you discuss any questions you have with your health care provider. °Document Released: 08/04/2013 Document Revised: 09/26/2017 Document Reviewed: 02/03/2016 °Elsevier Patient Education © 2020 [employer] ° °

## 2019-06-07 NOTE — H&P (Signed)
Chief Complaint: Patient was seen in consultation today for right neck mass biopsy at the request of Teoh,Su  Referring Physician(s): Teoh,Su  Supervising Physician: Markus Daft  Patient Status: [facility] - Out-pt  History of Present Illness: Stacey Herman is a 63 y.o. female   Pt noticed small knot at Rt neck maybe 3-4 weeks ago Continues to enlarge Painful ++smoker  7/22 CT:  IMPRESSION: 1. Negative for emergent intracranial large vessel occlusion. No significant intracranial stenosis 2. Mild atherosclerotic disease carotid bifurcation without significant stenosis. Both vertebral arteries widely patent. 3. 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 Recommend ENT consultation.  Was referred to Dr Benjamine Mola Now for biopsy of same  Past Medical History:  Diagnosis Date   Acid reflux    Aortic atherosclerosis ([place])    Claustrophobia    Claustrophobia    fears anesthesia and unable to have MRI unless open.  cannot tolerate head being confined.   Complication of anesthesia    "fights against anesthesia"   COPD (chronic obstructive pulmonary disease) (HCC)    DDD (degenerative disc disease), lumbar    Headache    occ   History of bronchitis    History of iron deficiency anemia    History of kidney stones    Kidney stones    Lumbar spondylosis    Medical history non-contributory    Renal disorder    Wears dentures    upper and lower   Wears glasses     Past Surgical History:  Procedure Laterality Date   APPENDECTOMY     BACK SURGERY     x2   CERVICAL FUSION     x3   CYSTOSCOPY WITH STENT PLACEMENT Left 07/26/2018   Procedure: CYSTOSCOPY, LEFT RETROGRADE, WITH LEFT URETERAL STENT PLACEMENT;  Surgeon: Lucas Mallow, MD;  Location: [facility];  Service: Urology;  Laterality: Left;   CYSTOSCOPY/URETEROSCOPY/HOLMIUM LASER/STENT PLACEMENT Left 08/05/2018   Procedure: CYSTOSCOPY LEFT /URETEROSCOPY/HOLMIUM  LASER/STENT EXCHANGE;  Surgeon: Lucas Mallow, MD;  Location: [facility];  Service: Urology;  Laterality: Left;   TOTAL HIP ARTHROPLASTY Left     Allergies: Amoxicillin, Aspirin, Benadryl [diphenhydramine], Ciprocin-fluocin-procin [fluocinolone], Ciprofloxacin, Morphine and related, Mushroom extract complex, Penicillins, Sulfa antibiotics, and Bactrim [sulfamethoxazole-trimethoprim]  Medications: Prior to Admission medications   Medication Sig Start Date End Date Taking? Authorizing Provider  acetaminophen (TYLENOL) 325 MG tablet Take 325-650 mg by mouth every 6 (six) hours as needed (for headaches or pain).    [provider]  albuterol (PROVENTIL HFA;VENTOLIN HFA) 108 (90 Base) MCG/ACT inhaler Inhale 2 puffs into the lungs every 6 (six) hours as needed for wheezing or shortness of breath.    [provider]  albuterol (PROVENTIL) (2.5 MG/3ML) 0.083% nebulizer solution Take 3 mLs (2.5 mg total) by nebulization every 6 (six) hours as needed for wheezing. 08/26/17   Hosie Poisson, MD  esomeprazole (NEXIUM) 20 MG capsule Take 20 mg by mouth daily as needed (acid reflux).     [provider]  Multiple Vitamin (MULTIVITAMIN WITH MINERALS) TABS tablet Take 1 tablet by mouth daily.    [provider]  oxyCODONE-acetaminophen (PERCOCET) 10-325 MG tablet Take 1 tablet by mouth every 6 (six) hours as needed for pain. Patient taking differently: Take 1 tablet by mouth 3 (three) times daily.  01/30/17   Orlie Dakin, MD     Family History  Problem Relation Age of Onset   Cancer Mother    Heart attack  Father    Heart attack Paternal Grandmother     Social History   Socioeconomic History   Marital status: Divorced    Spouse name: Not on file   Number of children: Not on file   Years of education: Not on file   Highest education level: Not on file  Occupational History   Occupation: retired  Scientist, product/process development  strain: Not on file   Food insecurity    Worry: Not on file    Inability: Not on Lexicographer needs    Medical: Not on file    Non-medical: Not on file  Tobacco Use   Smoking status: Current Every Day Smoker    Packs/day: 0.25    Years: 46.00    Pack years: 11.50    Types: Cigarettes   Smokeless tobacco: Never Used  Substance and Sexual Activity   Alcohol use: Yes    Comment: rare   Drug use: No   Sexual activity: Not on file  Lifestyle   Physical activity    Days per week: Not on file    Minutes per session: Not on file   Stress: Not on file  Relationships   Social connections    Talks on phone: Not on file    Gets together: Not on file    Attends religious service: Not on file    Active member of club or organization: Not on file    Attends meetings of clubs or organizations: Not on file    Relationship status: Not on file  Other Topics Concern   Not on file  Social History Narrative   Lives w daughter and 5 grandchildren     Review of Systems: A 12 point ROS discussed and pertinent positives are indicated in the HPI above.  All other systems are negative.  Review of Systems  Constitutional: Negative for activity change, fatigue and fever.  HENT: Negative for sore throat.   Respiratory: Negative for cough and choking.   Cardiovascular: Negative for chest pain.  Gastrointestinal: Negative for abdominal pain.  Psychiatric/Behavioral: Negative for behavioral problems and confusion.    Vital Signs: BP (!) 135/96    Pulse 98    Temp (!) 97.3 F (36.3 C) (Skin)    Resp 16    Ht 5' 10.5" (1.791 m)    Wt 192 lb (87.1 kg)    SpO2 90%    BMI 27.16 kg/m   Physical Exam Vitals signs reviewed.  Neck:     Musculoskeletal: Normal range of motion. Muscular tenderness present.     Comments: Right tender neck mass Cardiovascular:     Rate and Rhythm: Normal rate and regular rhythm.     Heart sounds: Normal heart sounds.  Pulmonary:     Effort:  Pulmonary effort is normal.     Breath sounds: Normal breath sounds.  Abdominal:     Palpations: Abdomen is soft.  Musculoskeletal: Normal range of motion.  Skin:    General: Skin is warm and dry.  Neurological:     Mental Status: She is alert and oriented to person, place, and time.  Psychiatric:        Mood and Affect: Mood normal.        Behavior: Behavior normal.        Thought Content: Thought content normal.        Judgment: Judgment normal.     Imaging: Ct Angio Head W Or Wo Contrast  Result Date: 05/19/2019  CLINICAL DATA:  Right-sided headache. Right facial numbness. Rule out stroke. EXAM: CT ANGIOGRAPHY HEAD AND NECK TECHNIQUE: Multidetector CT imaging of the head and neck was performed using the standard protocol during bolus administration of intravenous contrast. Multiplanar CT image reconstructions and MIPs were obtained to evaluate the vascular anatomy. Carotid stenosis measurements (when applicable) are obtained utilizing NASCET criteria, using the distal internal carotid diameter as the denominator. CONTRAST:  68mL OMNIPAQUE IOHEXOL 350 MG/ML SOLN COMPARISON:  CT head 05/19/2019 FINDINGS: CTA NECK FINDINGS Aortic arch: Minimal atherosclerotic disease in the aortic arch. Proximal great vessels widely patent. Right carotid system: Right common carotid artery widely patent. Mild atherosclerotic calcification right carotid bifurcation without significant stenosis. Left carotid system: Left common carotid artery widely patent. Mild atherosclerotic disease left carotid bifurcation without significant stenosis. Vertebral arteries: Both vertebral arteries patent to the basilar without significant stenosis. Skeleton: ACDF with solid fusion C3 through C6. No acute skeletal abnormality. Other neck: Enlarged lymph nodes right neck. Level 2 lymph node with heterogeneous enhancement and central necrosis. Lymph node measures 20 x 29 mm and is suspicious for metastatic disease. Mild enlargement  of the tonsils bilaterally with bilateral tonsilliths. No definite pharyngeal mucosal mass however direct mucosal evaluation is recommended. Upper chest: Lung apices clear bilaterally. Review of the MIP images confirms the above findings CTA HEAD FINDINGS Anterior circulation: Cavernous carotid widely patent bilaterally without stenosis. Anterior and middle cerebral arteries widely patent bilaterally. Posterior circulation: Both vertebral arteries patent to the basilar. PICA patent bilaterally. Basilar widely patent. AICA, superior cerebellar, and posterior cerebral arteries patent bilaterally without significant stenosis. Venous sinuses: Patent Anatomic variants: None Review of the MIP images confirms the above findings IMPRESSION: 1. Negative for emergent intracranial large vessel occlusion. No significant intracranial stenosis 2. Mild atherosclerotic disease carotid bifurcation without significant stenosis. Both vertebral arteries widely patent. 3. 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 Recommend ENT consultation. Electronically Signed   By: Franchot Gallo M.D.   On: 05/19/2019 20:42   Ct Head Wo Contrast  Result Date: 05/19/2019 CLINICAL DATA:  Severe right-sided headache with right facial numbness and right eye blurry vision since Saturday. EXAM: CT HEAD WITHOUT CONTRAST TECHNIQUE: Contiguous axial images were obtained from the base of the skull through the vertex without intravenous contrast. COMPARISON:  None. FINDINGS: Brain: No evidence of acute infarction, hemorrhage, hydrocephalus, extra-axial collection or mass lesion/mass effect. Vascular: No hyperdense vessel or unexpected calcification. Skull: Normal. Negative for fracture or focal lesion. Sinuses/Orbits: No acute finding. Other: None. IMPRESSION: 1.  No acute intracranial abnormality. Electronically Signed   By: Titus Dubin M.D.   On: 05/19/2019 17:58   Ct Angio Neck W And/or Wo  Contrast  Result Date: 05/19/2019 CLINICAL DATA:  Right-sided headache. Right facial numbness. Rule out stroke. EXAM: CT ANGIOGRAPHY HEAD AND NECK TECHNIQUE: Multidetector CT imaging of the head and neck was performed using the standard protocol during bolus administration of intravenous contrast. Multiplanar CT image reconstructions and MIPs were obtained to evaluate the vascular anatomy. Carotid stenosis measurements (when applicable) are obtained utilizing NASCET criteria, using the distal internal carotid diameter as the denominator. CONTRAST:  22mL OMNIPAQUE IOHEXOL 350 MG/ML SOLN COMPARISON:  CT head 05/19/2019 FINDINGS: CTA NECK FINDINGS Aortic arch: Minimal atherosclerotic disease in the aortic arch. Proximal great vessels widely patent. Right carotid system: Right common carotid artery widely patent. Mild atherosclerotic calcification right carotid bifurcation without significant stenosis. Left carotid system: Left common carotid artery widely patent. Mild atherosclerotic disease  left carotid bifurcation without significant stenosis. Vertebral arteries: Both vertebral arteries patent to the basilar without significant stenosis. Skeleton: ACDF with solid fusion C3 through C6. No acute skeletal abnormality. Other neck: Enlarged lymph nodes right neck. Level 2 lymph node with heterogeneous enhancement and central necrosis. Lymph node measures 20 x 29 mm and is suspicious for metastatic disease. Mild enlargement of the tonsils bilaterally with bilateral tonsilliths. No definite pharyngeal mucosal mass however direct mucosal evaluation is recommended. Upper chest: Lung apices clear bilaterally. Review of the MIP images confirms the above findings CTA HEAD FINDINGS Anterior circulation: Cavernous carotid widely patent bilaterally without stenosis. Anterior and middle cerebral arteries widely patent bilaterally. Posterior circulation: Both vertebral arteries patent to the basilar. PICA patent bilaterally.  Basilar widely patent. AICA, superior cerebellar, and posterior cerebral arteries patent bilaterally without significant stenosis. Venous sinuses: Patent Anatomic variants: None Review of the MIP images confirms the above findings IMPRESSION: 1. Negative for emergent intracranial large vessel occlusion. No significant intracranial stenosis 2. Mild atherosclerotic disease carotid bifurcation without significant stenosis. Both vertebral arteries widely patent. 3. 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 Recommend ENT consultation. Electronically Signed   By: Franchot Gallo M.D.   On: 05/19/2019 20:42    Labs:  CBC: Recent Labs    07/26/18 0545 08/05/18 0907 10/12/18 1256 11/14/18 1953 05/19/19 1719  WBC 5.9  --  6.5 4.1 6.8  HGB 14.2 16.0* 13.7 15.3* 15.8*  HCT 43.5 47.0* 44.3 46.8* 47.1*  PLT 195  --  223 130* 185    COAGS: Recent Labs    05/19/19 1719  INR 1.0  APTT 31    BMP: Recent Labs    07/26/18 0545 08/05/18 0907 10/12/18 1256 11/14/18 1953 05/19/19 1719  NA 140 139 137 135 135  K 4.3 4.2 3.6 3.5 3.8  CL 102  --  99 98 98  CO2 33*  --  27 29 27   GLUCOSE 93 92 94 101* 109*  BUN 9  --  5* 8 7*  CALCIUM 8.9  --  8.8* 8.8* 9.0  CREATININE 0.70  --  0.67 0.66 0.73  GFRNONAA >60  --  >60 >60 >60  GFRAA >60  --  >60 >60 >60    LIVER FUNCTION TESTS: Recent Labs    07/25/18 1451 11/14/18 2054 05/19/19 1719  BILITOT 0.7 0.5 0.5  AST 14* 20 16  ALT 12 22 14   ALKPHOS 81 61 81  PROT 6.0* 6.8 6.6  ALBUMIN 3.7 3.8 3.8    TUMOR MARKERS: No results for input(s): AFPTM, CEA, CA199, CHROMGRNA in the last 8760 hours.  Assessment and Plan:  Right neck mass Enlarging ++ smoker Scheduled for biopsy per Dr Benjamine Mola Risks and benefits of Rt neck mass bx was discussed with the patient and/or patient's family including, but not limited to bleeding, infection, damage to adjacent structures or low yield requiring additional  tests.  All of the questions were answered and there is agreement to proceed. Consent signed and in chart.   Thank you for this interesting consult.  I greatly enjoyed meeting [facility] and look forward to participating in their care.  A copy of this report was sent to the requesting provider on this date.  Electronically Signed: Lavonia Drafts, PA-C 06/07/2019, 12:27 PM   I spent a total of  30 Minutes   in face to face in clinical consultation, greater than 50% of which was counseling/coordinating care for right neck mass  biopsy

## 2019-06-15 ENCOUNTER — Other Ambulatory Visit: Payer: Self-pay | Admitting: Otolaryngology

## 2019-06-15 ENCOUNTER — Other Ambulatory Visit (HOSPITAL_COMMUNITY): Payer: Self-pay | Admitting: Otolaryngology

## 2019-06-15 DIAGNOSIS — C760 Malignant neoplasm of head, face and neck: Secondary | ICD-10-CM

## 2019-06-18 ENCOUNTER — Telehealth: Payer: Self-pay | Admitting: Hematology

## 2019-06-18 ENCOUNTER — Telehealth: Payer: Self-pay | Admitting: Radiation Oncology

## 2019-06-18 NOTE — Telephone Encounter (Signed)
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 I cld and lft a vm for Ms. Sorey to see Dr. Maylon Peppers on 9/2 at 1pm w/labs at 1230pm. Lft the appt info on her vm.

## 2019-06-18 NOTE — Telephone Encounter (Signed)
New Message: ° ° °LVM for patient to return call to schedule appt from referral received. °

## 2019-06-23 ENCOUNTER — Other Ambulatory Visit: Payer: Self-pay

## 2019-06-23 ENCOUNTER — Ambulatory Visit (HOSPITAL_COMMUNITY)
Admission: RE | Admit: 2019-06-23 | Discharge: 2019-06-23 | Disposition: A | Discharge status: Home / Self Care | Payer: Medicare Other | Source: Ambulatory Visit | Attending: Otolaryngology | Admitting: Otolaryngology

## 2019-06-23 DIAGNOSIS — I2510 Atherosclerotic heart disease of native coronary artery without angina pectoris: Secondary | ICD-10-CM | POA: Insufficient documentation

## 2019-06-23 DIAGNOSIS — I700 Atherosclerosis of aorta: Secondary | ICD-10-CM | POA: Diagnosis not present

## 2019-06-23 DIAGNOSIS — C760 Malignant neoplasm of head, face and neck: Secondary | ICD-10-CM | POA: Insufficient documentation

## 2019-06-23 DIAGNOSIS — N200 Calculus of kidney: Secondary | ICD-10-CM | POA: Insufficient documentation

## 2019-06-23 LAB — GLUCOSE, CAPILLARY: Glucose-Capillary: 116 mg/dL — ABNORMAL HIGH (ref 70–99)

## 2019-06-23 MED ORDER — FLUDEOXYGLUCOSE F - 18 (FDG) INJECTION
9.6000 | Freq: Once | INTRAVENOUS | Status: AC | PRN
  Administered 2019-06-23: 9.6 via INTRAVENOUS

## 2019-06-24 IMAGING — DX DG CHEST 2V
2 series · 2 of 2 positions shown · non-contrast
Comparison: July 07, 2017

CLINICAL DATA: Mid chest pain.

EXAM:
CHEST  2 VIEW

[chest pa]
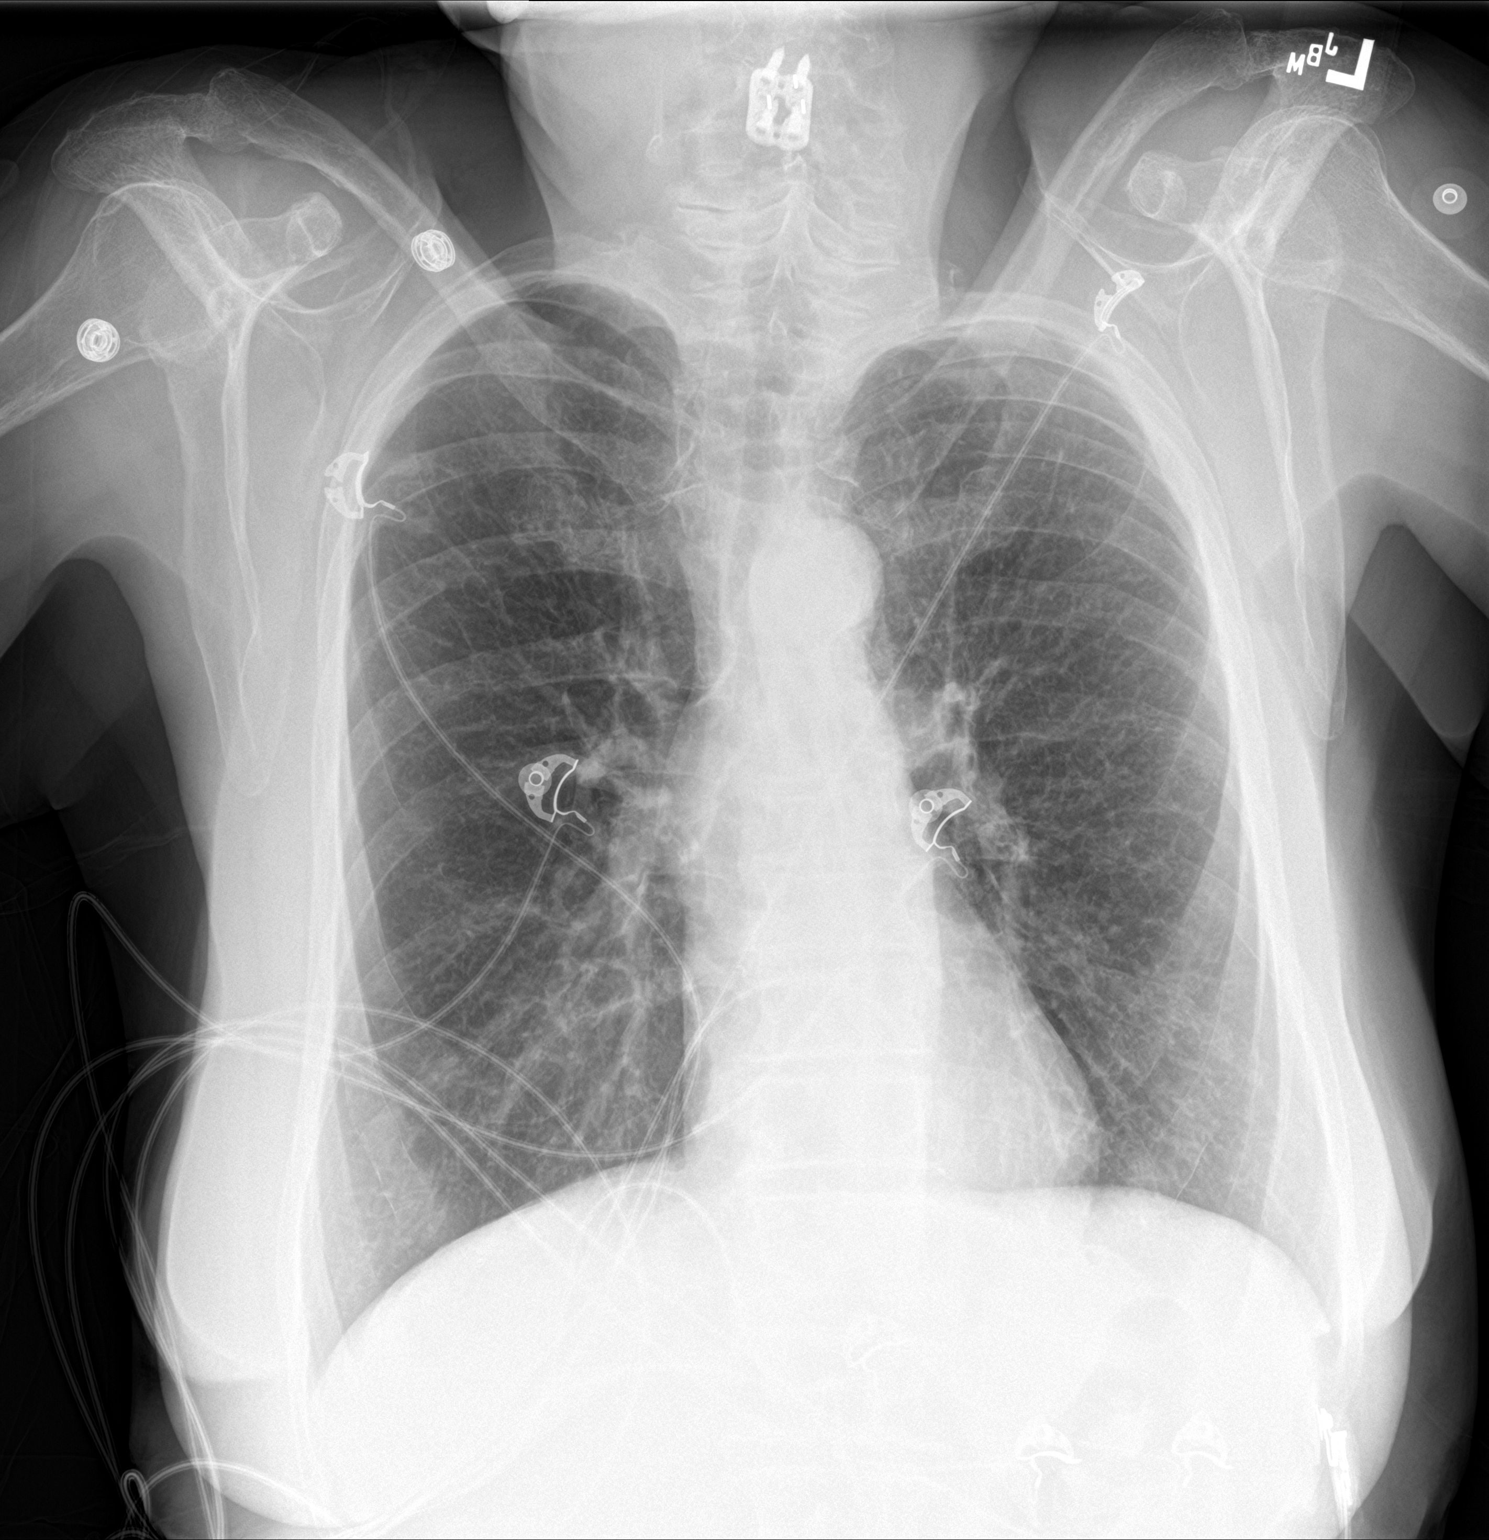

[chest lat]
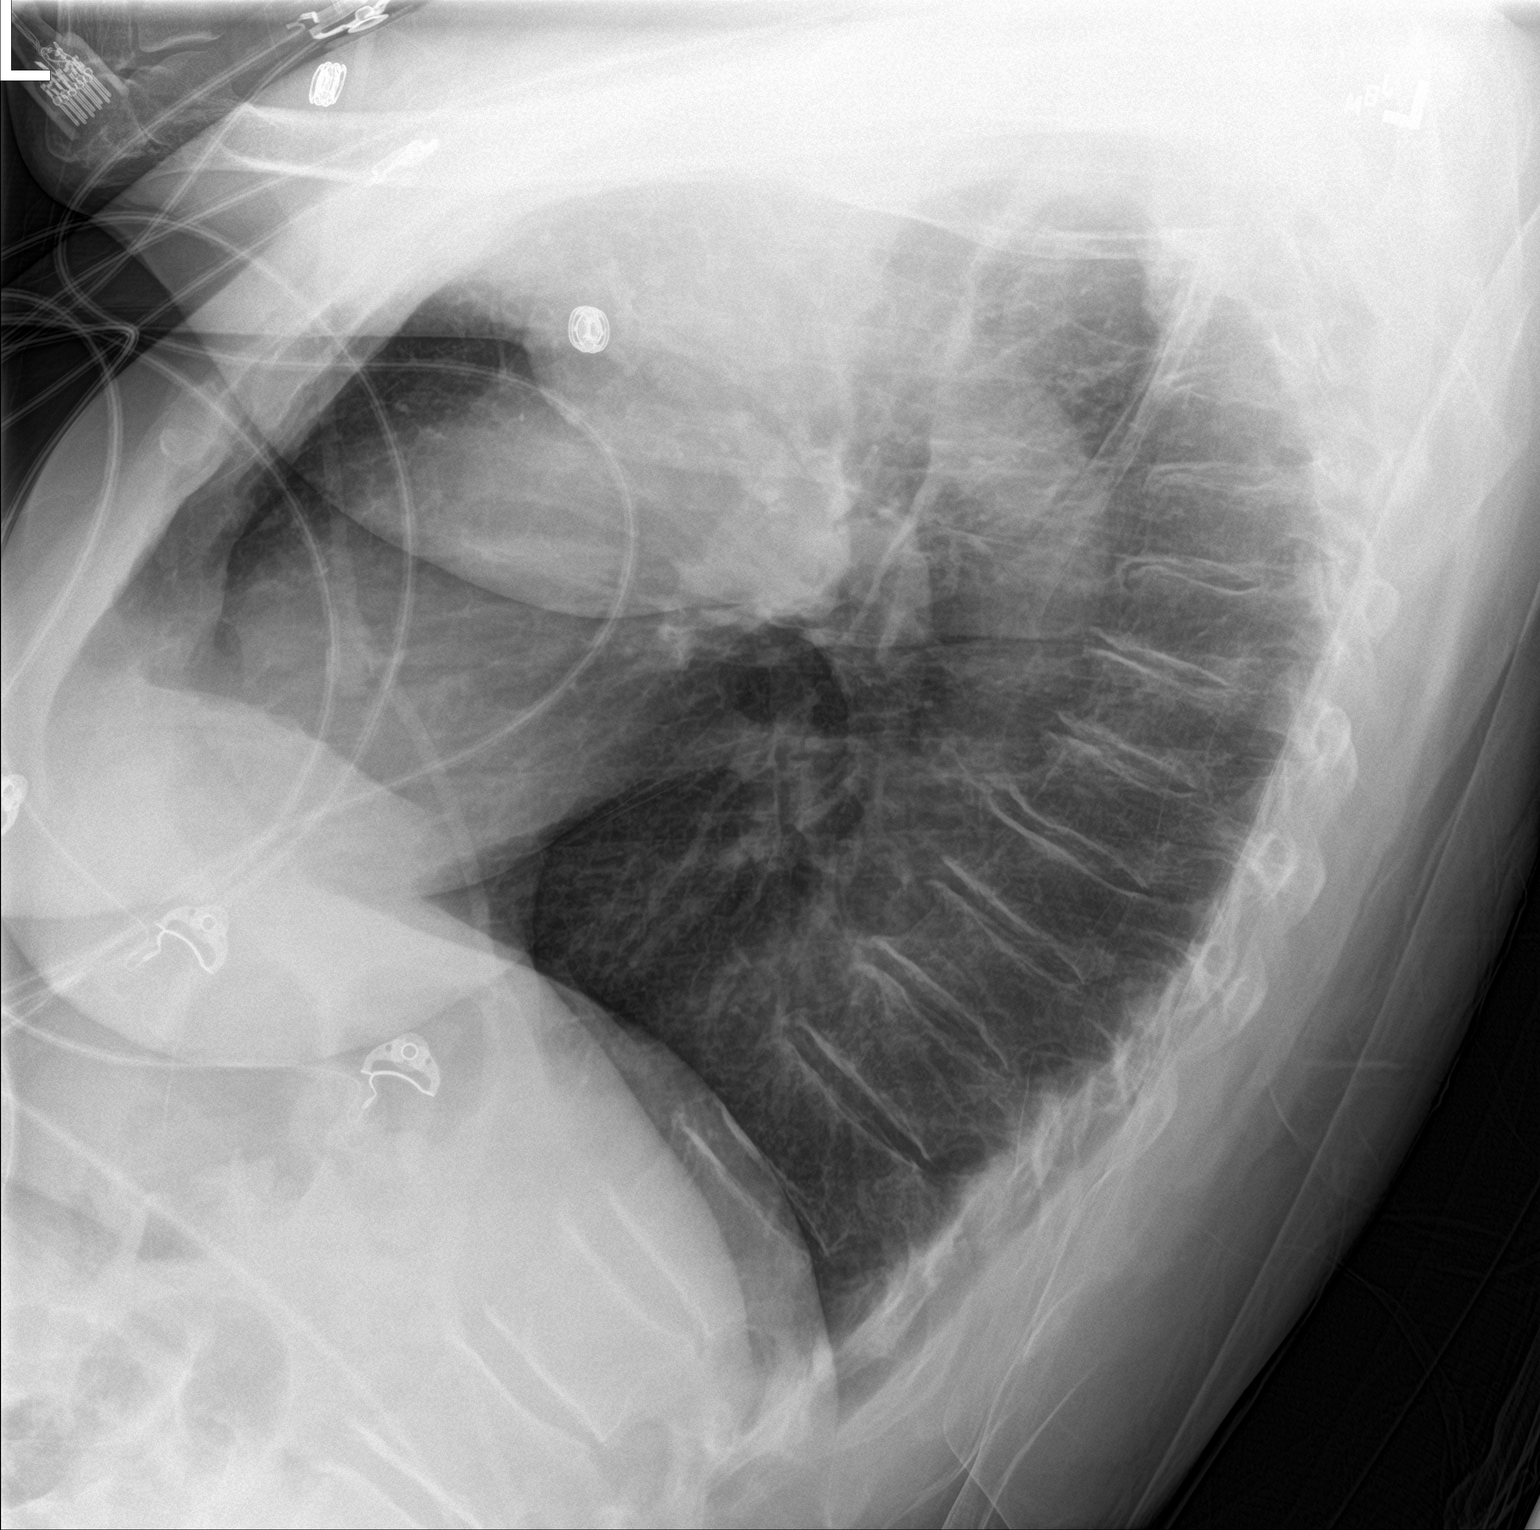

[2 of 2 positions shown; findings below may reference images not displayed]

FINDINGS: The heart, hila, mediastinum, lungs, and pleura are unchanged. No
focal infiltrate, nodule, mass, or overt edema.
IMPRESSION: No active cardiopulmonary disease.

## 2019-06-28 ENCOUNTER — Other Ambulatory Visit: Payer: Self-pay | Admitting: Hematology

## 2019-06-28 DIAGNOSIS — C760 Malignant neoplasm of head, face and neck: Secondary | ICD-10-CM | POA: Insufficient documentation

## 2019-06-28 DIAGNOSIS — C4442 Squamous cell carcinoma of skin of scalp and neck: Secondary | ICD-10-CM | POA: Insufficient documentation

## 2019-06-28 DIAGNOSIS — D7589 Other specified diseases of blood and blood-forming organs: Secondary | ICD-10-CM

## 2019-06-28 NOTE — Progress Notes (Signed)
[facility] NOTE  Patient Care Team: Rogers Blocker, MD as PCP - General (Internal Medicine)  HEME/ONC OVERVIEW: 1. SCCa of the R cervical LN, primary unknown -Late 04/2019: ~3cm right Level II necrotic cervical LN on CT  -05/2019: bx of cervical LN showed poorly differentiated carcinoma, favoring squamous cell; PET showed FDG-avid R Level II LN without any definite primary malignancy   ASSESSMENT & PLAN:   SCCa of the R cervical LN, primary unknown -I reviewed the patient's records in detail, including recent ER clinic notes, lab studies, imaging results, and the pathology reports -I also independently reviewed the radiology images of recent CT neck and PET, and agree with findings documented -In the center, patient presented to [facility] ER in late 04/2019 for right-sided headache, concerning for stroke.  CTA head and neck showed no evidence of vessel occlusion, but incidentally noted a ~3cm right cervical necrotic LN, concerning for malignancy.  Patient was referred to Dr. Melene Plan of ENT for further evaluation and underwent ultrasound-guided core bx that showed poorly differentiated carcinoma, favor squamous cell carcinoma.  PET in late 05/2019 showed FDG-avid right Level II cervical LN without any definite primary malignancy.  Patient is referred to oncology for further evaluation. -I reviewed imaging and pathology results in detail with the patient -We also reviewed NCCN guidelines detail -In light of the right cervical LN involvement, this is suggestive of a H&N primary malignancy.  However, PET did not demonstrate a definite primary malignancy site. -I discussed the case with Dr. Melene Plan.  The case was discussed extensively at H&N tumor board.  -The consensus is to proceed with upfront right neck dissection with possible right tonsillectomy and panendoscopy with biopsies. -Based on the pathology report, we will determine the adjuvant treatment regimen.  Given the lymph node  involvement, she will need at least adjuvant radiation, but if there are no high risk features on the final pathology (i.e. positive margin or extracapsular extension), the patient will not require adjuvant chemotherapy  Macrocytosis -Likely due to nutritional deficiencies -123456 and folic acid levels both very low -I have prescribed 123456 0000000 and folic acid 2mg  daily   Tobacco abuse -Patient reports smoking at least half a pack a day for the past 35 to 40 years -I counseled the patient on the importance of tobacco cessation and discussed various strategies -After lengthy discussion, patient was agreeable to nicotine patches to assist with tobacco cessation -I have prescribed nicotine patches to the patient's pharmacy, and cautioned her not to smoke and use nicotine patches at the same time due to the risk of nicotine toxicity  Intermittent headaches -Etiology unclear, possibly due to tension/anxiety vs migraine -Recent CTA head did not show any acute intracranial pathology -In the absence of any definite primary malignancy and isolated right cervical lymphadenopathy, I discussed with the patient that it would be unlikely to cause her headache, and that she should continue follow-up with her PCP for further management  Chronic pain syndrome -Followed by pain management, on Percocet -I will defer further management and refill of her opioid medication to the pain specialist  Anxiety -On PRN Ativan (taking daily) -I recommended the patient to contact her PCP for refills to avoid multiple prescribers refilling medications   No orders of the defined types were placed in this encounter.  A total of more than 60 minutes were spent face-to-face with the patient during this encounter and over half of that time was spent on counseling and coordination of care  as outlined above.    All questions were answered. The patient knows to call the clinic with any problems, questions or concerns.  Return  to be determined, pending pathology results.  Tish Men, MD 06/30/2019 2:34 PM   CHIEF COMPLAINTS/PURPOSE OF CONSULTATION:  "I still have a headache"  HISTORY OF PRESENTING ILLNESS:  Stacey Herman 63 y.o. 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  Patient presented to [facility] ER in late 04/2019 for evaluation of right-sided head/neck pain, new onset, throbbing, radiating to the right side of the face, and associated with blurry vision in the right eye and numbness in the right side of the face.  CTA head and neck were done due to concern for possible stroke, which did not demonstrate any large vessel occlusion, but did demonstrate a ~3cm right necrotic cervical lymph node, concerning for malignancy.  Patient was referred to Dr. Melene Plan of ENT, who ordered ultrasound-guided cervical lymph node biopsy that showed poorly differentiated carcinoma, favoring squamous cell carcinoma.  PET scan showed a FDG-avid right cervical LN without any definite primary malignancy.  Patient was referred to oncology for further evaluation.  Patient has several complaints today, all of which started around the time of her initial diagnosis.  She reports that she has had an intermittent right-sided headache, nonradiating, throbbing, for which she has been taking Percocets (prescribed for chronic pain syndrome) without any relief.  She also reports associated blurry vision and some numbness sensation on the right side of her face, but her CTA head was negative for any vascular abnormality.  She also reports that she has occasional dysphasia since July 2020, but she denies any odynophagia and has gained weight.  She also reports some choking sensation when she lies flat at night, for which she was recently prescribed Ativan for anxiety.  She smokes at least half a pack a day for the past 30 years.  She denies any alcohol or illicit drug use.  She is on disability.  I have reviewed her  chart and materials related to her cancer extensively and collaborated history with the patient. Summary of oncologic history is as follows: Oncology History  Squamous cell carcinoma of head and neck ([place])  05/19/2019 Imaging   CTA head and neck: IMPRESSION: 1. Negative for emergent intracranial large vessel occlusion. No significant intracranial stenosis 2. Mild atherosclerotic disease carotid bifurcation without significant stenosis. Both vertebral arteries widely patent. 3. 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 Recommend ENT consultation.   06/07/2019 Imaging   IMPRESSION: Ultrasound-guided core biopsies of right cervical lymph node.   06/07/2019 Pathology Results   Accession: [id number]  Lymph node, needle/core biopsy, Right neck - METASTATIC POORLY DIFFERENTIATED CARCINOMA, FAVOR SQUAMOUS CELL CARCINOMA. - SEE COMMENT.   06/23/2019 Imaging   PET: IMPRESSION: 1. Hypermetabolic right level 2 lymph node in the neck. No additional abnormal hypermetabolism in the neck, chest, abdomen or pelvis. 2. Bilateral renal stones. 3. Aortic atherosclerosis (ICD10-170.0). Coronary artery calcification.     MEDICAL HISTORY:  Past Medical History:  Diagnosis Date  . Acid reflux   . Aortic atherosclerosis ([place])   . Claustrophobia   . Claustrophobia    fears anesthesia and unable to have MRI unless open.  cannot tolerate head being confined.  . Complication of anesthesia    "fights against anesthesia"  . COPD (chronic obstructive pulmonary disease) ([place])   . DDD (degenerative disc disease), lumbar   . Headache  occ  . History of bronchitis   . History of iron deficiency anemia   . History of kidney stones   . Kidney stones   . Lumbar spondylosis   . Medical history non-contributory   . Renal disorder   . Wears dentures    upper and lower  . Wears glasses     SURGICAL HISTORY: Past Surgical History:  Procedure Laterality  Date  . APPENDECTOMY    . BACK SURGERY     x2  . CERVICAL FUSION     x3  . CYSTOSCOPY WITH STENT PLACEMENT Left 07/26/2018   Procedure: CYSTOSCOPY, LEFT RETROGRADE, WITH LEFT URETERAL STENT PLACEMENT;  Surgeon: Lucas Mallow, MD;  Location: [facility];  Service: Urology;  Laterality: Left;  . CYSTOSCOPY/URETEROSCOPY/HOLMIUM LASER/STENT PLACEMENT Left 08/05/2018   Procedure: CYSTOSCOPY LEFT /URETEROSCOPY/HOLMIUM LASER/STENT EXCHANGE;  Surgeon: Lucas Mallow, MD;  Location: [facility];  Service: Urology;  Laterality: Left;  . TOTAL HIP ARTHROPLASTY Left     SOCIAL HISTORY: Social History   Socioeconomic History  . Marital status: Divorced    Spouse name: Not on file  . Number of children: Not on file  . Years of education: Not on file  . Highest education level: Not on file  Occupational History  . Occupation: retired  Scientific laboratory technician  . Financial resource strain: Not on file  . Food insecurity    Worry: Not on file    Inability: Not on file  . Transportation needs    Medical: Not on file    Non-medical: Not on file  Tobacco Use  . Smoking status: Current Every Day Smoker    Packs/day: 0.25    Years: 46.00    Pack years: 11.50    Types: Cigarettes  . Smokeless tobacco: Never Used  Substance and Sexual Activity  . Alcohol use: Yes    Comment: rare  . Drug use: No  . Sexual activity: Not on file  Lifestyle  . Physical activity    Days per week: Not on file    Minutes per session: Not on file  . Stress: Not on file  Relationships  . Social Herbalist on phone: Not on file    Gets together: Not on file    Attends religious service: Not on file    Active member of club or organization: Not on file    Attends meetings of clubs or organizations: Not on file    Relationship status: Not on file  . Intimate partner violence    Fear of current or ex partner: Not on file    Emotionally abused: Not on file    Physically abused: Not on file     Forced sexual activity: Not on file  Other Topics Concern  . Not on file  Social History Narrative   Lives w daughter and 5 grandchildren     FAMILY HISTORY: Family History  Problem Relation Age of Onset  . Cancer Mother   . Heart attack Father   . Heart attack Paternal Grandmother     ALLERGIES:  is allergic to amoxicillin; aspirin; benadryl [diphenhydramine]; ciprocin-fluocin-procin [fluocinolone]; ciprofloxacin; morphine and related; mushroom extract complex; penicillins; sulfa antibiotics; and bactrim [sulfamethoxazole-trimethoprim].  MEDICATIONS:  Current Outpatient Medications  Medication Sig Dispense Refill  . acetaminophen (TYLENOL) 325 MG tablet Take 325-650 mg by mouth every 6 (six) hours as needed (for headaches or pain).    Marland Kitchen albuterol (PROVENTIL HFA;VENTOLIN HFA) 108 (90 Base) MCG/ACT  inhaler Inhale 2 puffs into the lungs every 6 (six) hours as needed for wheezing or shortness of breath.    Marland Kitchen albuterol (PROVENTIL) (2.5 MG/3ML) 0.083% nebulizer solution Take 3 mLs (2.5 mg total) by nebulization every 6 (six) hours as needed for wheezing. 75 mL 12  . esomeprazole (NEXIUM) 20 MG capsule Take 20 mg by mouth daily as needed (acid reflux).     . LORazepam (ATIVAN) 0.5 MG tablet Take 0.5 mg by mouth 2 (two) times daily.    . Multiple Vitamin (MULTIVITAMIN WITH MINERALS) TABS tablet Take 1 tablet by mouth daily.    Marland Kitchen oxyCODONE-acetaminophen (PERCOCET) 10-325 MG tablet Take 1 tablet by mouth every 6 (six) hours as needed for pain. (Patient taking differently: Take 1 tablet by mouth 3 (three) times daily. ) 12 tablet 0  . folic acid (FOLVITE) 1 MG tablet Take 2 tablets (2 mg total) by mouth daily. 60 tablet 5  . nicotine (NICODERM CQ - DOSED IN MG/24 HOURS) 21 mg/24hr patch Place 1 patch (21 mg total) onto the skin daily. 28 patch 3  . vitamin B-12 (CYANOCOBALAMIN) 500 MCG tablet Take 2 tablets (1,000 mcg total) by mouth daily. 180 tablet 3   No current facility-administered  medications for this visit.     REVIEW OF SYSTEMS:   Constitutional: ( - ) fevers, ( - )  chills , ( - ) night sweats Eyes: ( + ) blurriness of vision, ( - ) double vision, ( - ) watery eyes Ears, nose, mouth, throat, and face: ( - ) mucositis, ( - ) sore throat Respiratory: ( - ) cough, ( - ) dyspnea, ( - ) wheezes Cardiovascular: ( - ) palpitation, ( - ) chest discomfort, ( - ) lower extremity swelling Gastrointestinal:  ( - ) nausea, ( - ) heartburn, ( - ) change in bowel habits Skin: ( - ) abnormal skin rashes Lymphatics: ( - ) new lymphadenopathy, ( - ) easy bruising Neurological: ( - ) numbness, ( - ) tingling, ( - ) new weaknesses Behavioral/Psych: ( - ) mood change, ( - ) new changes  All other systems were reviewed with the patient and are negative.  PHYSICAL EXAMINATION: ECOG PERFORMANCE STATUS: 2 - Symptomatic, <50% confined to bed  Vitals:   06/30/19 1316  BP: 114/78  Pulse: (!) 106  Resp: 18  Temp: 100.1 F (37.8 C)  SpO2: 91%   Filed Weights   06/30/19 1316  Weight: 206 lb 11.2 oz (93.8 kg)    GENERAL: alert, fluctuating emotion (alternating between happy and teary), very obese, use a rollator, significantly older than stated age  SKIN: skin color, texture, turgor are normal, no rashes or significant lesions EYES: conjunctiva are pink and non-injected, sclera clear OROPHARYNX: edentulous NECK: supple, mildly tender with palpation  LYMPH:  A ~3cm right cervical LN, mildly tender, no other palpable lymphadenopathy  LUNGS: clear to auscultation with normal breathing effort HEART: regular rate & rhythm, no murmurs, no lower extremity edema ABDOMEN: soft, non-tender, non-distended, normal bowel sounds Musculoskeletal: no cyanosis of digits and no clubbing  PSYCH: alert & oriented x 3, fluent speech, fluctuating mood   LABORATORY DATA:  I have reviewed the data as listed Lab Results  Component Value Date   WBC 6.5 06/30/2019   HGB 16.1 (H) 06/30/2019   HCT  48.4 (H) 06/30/2019   MCV 100.2 (H) 06/30/2019   PLT 205 06/30/2019   Lab Results  Component Value Date   NA 136 06/30/2019  K 4.2 06/30/2019   CL 98 06/30/2019   CO2 30 06/30/2019    RADIOGRAPHIC STUDIES: I have personally reviewed the radiological images as listed and agreed with the findings in the report. Nm Pet Image Initial (pi) Skull Base To Thigh  Result Date: 06/23/2019 CLINICAL DATA:  Initial treatment strategy for malignant neoplasm of the neck. EXAM: NUCLEAR MEDICINE PET SKULL BASE TO THIGH TECHNIQUE: 9.6 mCi F-18 FDG was injected intravenously. Full-ring PET imaging was performed from the skull base to thigh after the radiotracer. CT data was obtained and used for attenuation correction and anatomic localization. Fasting blood glucose: 116 mg/dl COMPARISON:  CT neck 05/19/2019. FINDINGS: Mediastinal blood pool activity: SUV max 3.0 Liver activity: SUV max NA NECK: Necrotic appearing right level-II lymph node measures 2.8 x 3.2 cm with an SUV 12.1. No additional abnormal hypermetabolism. Incidental CT findings: None. CHEST: No hypermetabolic mediastinal, hilar or axillary lymph nodes. No hypermetabolic pulmonary nodules. Incidental CT findings: Atherosclerotic calcification of the aorta and coronary arteries. No pericardial or pleural effusion. ABDOMEN/PELVIS: No abnormal hypermetabolism in the liver, adrenal glands, spleen or pancreas. No hypermetabolic lymph nodes. Incidental CT findings: Liver, gallbladder and adrenal glands are unremarkable. Stones are seen in the kidneys bilaterally. Spleen, pancreas, stomach and bowel are unremarkable. Atherosclerotic calcification of the aorta without aneurysm. SKELETON: No abnormal osseous hypermetabolism. Incidental CT findings: Left hip arthroplasty. Degenerative changes in the spine. IMPRESSION: 1. Hypermetabolic right level 2 lymph node in the neck. No additional abnormal hypermetabolism in the neck, chest, abdomen or pelvis. 2. Bilateral  renal stones. 3. Aortic atherosclerosis (ICD10-170.0). Coronary artery calcification. Electronically Signed   By: Lorin Picket M.D.   On: 06/23/2019 15:20   [place] Core Biopsy (lymph Nodes)  Result Date: 06/08/2019 INDICATION: 63 year old with an enlarged right lymph node. Evaluate for a neoplastic process. EXAM: ULTRASOUND-GUIDED BIOPSY OF RIGHT NECK LYMPH NODE MEDICATIONS: None. ANESTHESIA/SEDATION: Moderate (conscious) sedation was employed during this procedure. A total of Versed 2.0 mg and Fentanyl 100 mcg was administered intravenously. Moderate Sedation Time: 15 minutes. The patient's level of consciousness and vital signs were monitored continuously by radiology nursing throughout the procedure under my direct supervision. FLUOROSCOPY TIME:  None COMPLICATIONS: None immediate. PROCEDURE: Informed written consent was obtained from the patient after a thorough discussion of the procedural risks, benefits and alternatives. All questions were addressed. A timeout was performed prior to the initiation of the procedure. The neck was evaluated with ultrasound. Enlarged lymph node in the upper right neck was identified and targeted for biopsy. The neck was prepped with chlorhexidine and sterile field was created. Skin and soft tissues were anesthetized with 1% lidocaine. Using ultrasound guidance, 18 gauge core needle was directed into the enlarged lymph node. Multiple core samples were obtained and placed in saline. Bandage placed over the puncture site. FINDINGS: Enlarged partially necrotic right cervical lymph node that measures 4.1 x 2.0 x 3.2 cm. Biopsy needle confirmed within the lymph node. IMPRESSION: Ultrasound-guided core biopsies of right cervical lymph node. Electronically Signed   By: Markus Daft M.D.   On: 06/08/2019 07:52    PATHOLOGY: I have reviewed the pathology reports as documented in the oncologist history.

## 2019-06-30 ENCOUNTER — Other Ambulatory Visit: Payer: Self-pay

## 2019-06-30 ENCOUNTER — Encounter: Payer: Self-pay | Admitting: Hematology

## 2019-06-30 ENCOUNTER — Inpatient Hospital Stay: Payer: Medicare Other

## 2019-06-30 ENCOUNTER — Inpatient Hospital Stay: Payer: Medicare Other | Attending: Hematology | Admitting: Hematology

## 2019-06-30 ENCOUNTER — Telehealth: Payer: Self-pay | Admitting: *Deleted

## 2019-06-30 VITALS — BP 114/78 | HR 106 | Temp 100.1°F | Resp 18 | Ht 70.5 in | Wt 206.7 lb

## 2019-06-30 DIAGNOSIS — C760 Malignant neoplasm of head, face and neck: Secondary | ICD-10-CM | POA: Diagnosis present

## 2019-06-30 DIAGNOSIS — F17210 Nicotine dependence, cigarettes, uncomplicated: Secondary | ICD-10-CM | POA: Insufficient documentation

## 2019-06-30 DIAGNOSIS — J449 Chronic obstructive pulmonary disease, unspecified: Secondary | ICD-10-CM

## 2019-06-30 DIAGNOSIS — R51 Headache: Secondary | ICD-10-CM | POA: Insufficient documentation

## 2019-06-30 DIAGNOSIS — Z79899 Other long term (current) drug therapy: Secondary | ICD-10-CM | POA: Diagnosis not present

## 2019-06-30 DIAGNOSIS — F419 Anxiety disorder, unspecified: Secondary | ICD-10-CM | POA: Diagnosis not present

## 2019-06-30 DIAGNOSIS — Z720 Tobacco use: Secondary | ICD-10-CM

## 2019-06-30 DIAGNOSIS — G894 Chronic pain syndrome: Secondary | ICD-10-CM | POA: Insufficient documentation

## 2019-06-30 DIAGNOSIS — D7589 Other specified diseases of blood and blood-forming organs: Secondary | ICD-10-CM

## 2019-06-30 DIAGNOSIS — R519 Headache, unspecified: Secondary | ICD-10-CM

## 2019-06-30 LAB — CBC WITH DIFFERENTIAL (CANCER CENTER ONLY)
Abs Immature Granulocytes: 0.01 10*3/uL (ref 0.00–0.07)
Basophils Absolute: 0 10*3/uL (ref 0.0–0.1)
Basophils Relative: 1 %
Eosinophils Absolute: 0.1 10*3/uL (ref 0.0–0.5)
Eosinophils Relative: 2 %
HCT: 48.4 % — ABNORMAL HIGH (ref 36.0–46.0)
Hemoglobin: 16.1 g/dL — ABNORMAL HIGH (ref 12.0–15.0)
Immature Granulocytes: 0 %
Lymphocytes Relative: 18 %
Lymphs Abs: 1.2 10*3/uL (ref 0.7–4.0)
MCH: 33.3 pg (ref 26.0–34.0)
MCHC: 33.3 g/dL (ref 30.0–36.0)
MCV: 100.2 fL — ABNORMAL HIGH (ref 80.0–100.0)
Monocytes Absolute: 0.5 10*3/uL (ref 0.1–1.0)
Monocytes Relative: 8 %
Neutro Abs: 4.7 10*3/uL (ref 1.7–7.7)
Neutrophils Relative %: 71 %
Platelet Count: 205 10*3/uL (ref 150–400)
RBC: 4.83 MIL/uL (ref 3.87–5.11)
RDW: 12.5 % (ref 11.5–15.5)
WBC Count: 6.5 10*3/uL (ref 4.0–10.5)
nRBC: 0 % (ref 0.0–0.2)

## 2019-06-30 LAB — CMP (CANCER CENTER ONLY)
ALT: 10 U/L (ref 0–44)
AST: 12 U/L — ABNORMAL LOW (ref 15–41)
Albumin: 3.8 g/dL (ref 3.5–5.0)
Alkaline Phosphatase: 100 U/L (ref 38–126)
Anion gap: 8 (ref 5–15)
BUN: 8 mg/dL (ref 8–23)
CO2: 30 mmol/L (ref 22–32)
Calcium: 9.1 mg/dL (ref 8.9–10.3)
Chloride: 98 mmol/L (ref 98–111)
Creatinine: 0.87 mg/dL (ref 0.44–1.00)
GFR, Est AFR Am: >60 mL/min (ref >60)
GFR, Estimated: >60 mL/min (ref >60)
Glucose, Bld: 96 mg/dL (ref 70–99)
Potassium: 4.2 mmol/L (ref 3.5–5.1)
Sodium: 136 mmol/L (ref 135–145)
Total Bilirubin: 0.5 mg/dL (ref 0.3–1.2)
Total Protein: 6.9 g/dL (ref 6.5–8.1)

## 2019-06-30 LAB — VITAMIN B12: Vitamin B-12: 76 pg/mL — ABNORMAL LOW (ref 180–914)

## 2019-06-30 LAB — FOLATE: Folate: 2 ng/mL — ABNORMAL LOW (ref >5.9)

## 2019-06-30 LAB — TSH: TSH: 1.33 u[IU]/mL (ref 0.308–3.960)

## 2019-06-30 MED ORDER — FOLIC ACID 1 MG PO TABS: 2.0000 mg | ORAL_TABLET | Freq: Every day | ORAL | 5 refills | Status: AC

## 2019-06-30 MED ORDER — NICOTINE 21 MG/24HR TD PT24: 21.0000 mg | MEDICATED_PATCH | Freq: Every day | TRANSDERMAL | 3 refills | Status: DC

## 2019-06-30 MED ORDER — CYANOCOBALAMIN 500 MCG PO TABS: 1000.0000 ug | ORAL_TABLET | Freq: Every day | ORAL | 3 refills | Status: AC

## 2019-06-30 NOTE — Telephone Encounter (Signed)
-----   Message from Tish Men, MD sent at 06/30/2019  2:32 PM EDT ----- Hi Beth,  Can you let the patient know that her 123456 and folic acid levels are really low, and I have sent a prescription for both to her pharmacy?Thanks.  [place]  ----- Message ----- From: Buel Ream, Lab In [place] Sent: 06/30/2019   1:13 PM EDT To: Tish Men, MD

## 2019-06-30 NOTE — Telephone Encounter (Signed)
TCT patient's daughter, Lonn [place]. Spoke with her and advised that her mother's folic acid and 123456 levels are quite low. Informed her that 2 new prescriptions have been sent to her pharmacy [facility]) for both. Kayla voiced understanding.

## 2019-07-01 NOTE — Progress Notes (Signed)
Head and Neck Cancer Location of Tumor / Histology:  06/07/19 Diagnosis Lymph node, needle/core biopsy, Right neck - METASTATIC POORLY DIFFERENTIATED CARCINOMA, FAVOR SQUAMOUS CELL CARCINOMA  Patient presented to the ED on 05/19/19 with symptoms of: headache to the right side, right facial numbness, blurry vision to right eye. She also reported feeling a lump in her throat.   Biopsies of right neck lymph node revealed: metastatic poorly differentiated carcinoma.   Nutrition Status Yes No Comments  Weight changes? []  [x]    Swallowing concerns? []  [x]  She does report decreased appetite and food getting stuck in her throat. She is eating softer foods.   PEG? []  [x]     Referrals Yes No Comments  Social Work? []  [x]    Dentistry? []  [x]    Swallowing therapy? []  [x]    Nutrition? []  [x]    Med/Onc? [x]  []  Dr. Maylon Peppers 06/30/19   Safety Issues Yes No Comments  Prior radiation? []  [x]    Pacemaker/ICD? []  [x]    Possible current pregnancy? []  [x]    Is the patient on methotrexate? []  [x]     Tobacco/Marijuana/Snuff/ETOH use: She has a history of smoking a 1/2 pack daily for 35- 40 years. She plans to start using a nicotine patch.  Past/Anticipated interventions by otolaryngology, if any: Dr. Benjamine Mola performed ultra-sound guided core biopsy.  Neck dissection is scheduled for 07/14/19  Past/Anticipated interventions by medical oncology, if any:  06/30/19 Dr. Maylon Peppers ASSESSMENT & PLAN:   SCCa of the R cervical LN, primary unknown -I reviewed the patient's records in detail, including recent ER clinic notes, lab studies, imaging results, and the pathology reports -I also independently reviewed the radiology images of recent CT neck and PET, and agree with findings documented -In the center, patient presented to [facility] ER in late 04/2019 for right-sided headache, concerning for stroke.  CTA head and neck showed no evidence of vessel occlusion, but incidentally noted a ~3cm right cervical necrotic LN, concerning  for malignancy.  Patient was referred to Dr. Melene Plan of ENT for further evaluation and underwent ultrasound-guided core bx that showed poorly differentiated carcinoma, favor squamous cell carcinoma.  PET in late 05/2019 showed FDG-avid right Level II cervical LN without any definite primary malignancy.  Patient is referred to oncology for further evaluation. -I reviewed imaging and pathology results in detail with the patient -We also reviewed NCCN guidelines detail -In light of the right cervical LN involvement, this is suggestive of a H&N primary malignancy.  However, PET did not demonstrate a definite primary malignancy site. -I discussed the case with Dr. Melene Plan.  The case was discussed extensively at H&N tumor board.  -The consensus is to proceed with upfront right neck dissection with possible right tonsillectomy and panendoscopy with biopsies. -Based on the pathology report, we will determine the adjuvant treatment regimen.  Given the lymph node involvement, she will need at least adjuvant radiation, but if there are no high risk features on the final pathology (i.e. positive margin or extracapsular extension), the patient will not require adjuvant chemotherapy   Current Complaints / other details:

## 2019-07-06 ENCOUNTER — Other Ambulatory Visit: Payer: Self-pay | Admitting: Otolaryngology

## 2019-07-07 ENCOUNTER — Encounter: Payer: Self-pay | Admitting: Radiation Oncology

## 2019-07-07 ENCOUNTER — Other Ambulatory Visit: Payer: Self-pay

## 2019-07-07 ENCOUNTER — Institutional Professional Consult (permissible substitution): Payer: Medicare Other | Admitting: Radiation Oncology

## 2019-07-07 ENCOUNTER — Ambulatory Visit
Admission: RE | Admit: 2019-07-07 | Discharge: 2019-07-07 | Disposition: A | Discharge status: Home / Self Care | Payer: Medicare Other | Source: Ambulatory Visit | Attending: Radiation Oncology | Admitting: Radiation Oncology

## 2019-07-07 DIAGNOSIS — C760 Malignant neoplasm of head, face and neck: Secondary | ICD-10-CM

## 2019-07-07 NOTE — Progress Notes (Signed)
Radiation Oncology         [phone] ________________________________  Initial outpatient Consultation by phone due to pandemic precautions, patient unable to access webex  Name: Stacey Herman MRN: [id number]  Date: 07/07/2019  DOB: 09/09/1956  ZD:[id number], Stacey Littler, MD  Leta Baptist, MD   REFERRING PHYSICIAN: Leta Baptist, MD  DIAGNOSIS:    ICD-10-CM   1. Squamous cell carcinoma of head and neck (HCC)  C76.0 Ambulatory referral to Social Work    Ambulatory referral to Physical Therapy    Amb Referral to Nutrition and Diabetic E    Referral to Neuro Rehab   Staging pending further biopsies  CHIEF COMPLAINT: Here to discuss management of neck cancer  HISTORY OF PRESENT ILLNESS::Stacey Herman is a 63 y.o. female who presented to the ED on 05/19/2019 with severe right-sided headache with right facial numbness and right eye blurry vision, as well as right-sided neck mass. To rule out stroke, she underwent angio head and neck CT, which confirmed no signs of stroke but revealed a large necrotic lymph node in the right neck suspicious for metastatic pharyngeal carcinoma.  Subsequently, the patient saw Dr. Benjamine Mola who performed biopsy of the lymph node on 06/07/2019. 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  Pertinent imaging thus far includes PET scan performed on 06/23/2019 revealing hypermetabolic right level 2 lymph node in the neck, with no additional abnormal hypermetabolism in the neck, chest, abdomen, or pelvis.     She met with Dr. Maylon Peppers on 06/30/2019, and her case was discussed at the multidisciplinary tumor board. She is scheduled to proceed with right neck dissection on 07/14/2019, ipsilateral tonsillectomy, and targeted biopsies per tumor board recommendations  Swallowing issues, if any: sometimes food sticks in throat  Weight Changes: no  Tobacco history, if any: She has a history of smoking a 1/2 pack daily for 35- 40 years. She plans to start using a  nicotine patch but hasn't picked it up   She reports anxiety. Some claustrophobia. She reports she is endentulous  PREVIOUS RADIATION THERAPY: No  PAST MEDICAL HISTORY:  has a past medical history of Acid reflux, Aortic atherosclerosis ([place]), Claustrophobia, Claustrophobia, Complication of anesthesia, COPD (chronic obstructive pulmonary disease) (HCC), DDD (degenerative disc disease), lumbar, Headache, History of bronchitis, History of iron deficiency anemia, History of kidney stones, Kidney stones, Lumbar spondylosis, Medical history non-contributory, Renal disorder, Wears dentures, and Wears glasses.    PAST SURGICAL HISTORY: Past Surgical History:  Procedure Laterality Date   APPENDECTOMY     BACK SURGERY     x2   CERVICAL FUSION     x3   CYSTOSCOPY WITH STENT PLACEMENT Left 07/26/2018   Procedure: CYSTOSCOPY, LEFT RETROGRADE, WITH LEFT URETERAL STENT PLACEMENT;  Surgeon: Lucas Mallow, MD;  Location: [facility];  Service: Urology;  Laterality: Left;   CYSTOSCOPY/URETEROSCOPY/HOLMIUM LASER/STENT PLACEMENT Left 08/05/2018   Procedure: CYSTOSCOPY LEFT /URETEROSCOPY/HOLMIUM LASER/STENT EXCHANGE;  Surgeon: Lucas Mallow, MD;  Location: [facility];  Service: Urology;  Laterality: Left;   TOTAL HIP ARTHROPLASTY Left     FAMILY HISTORY: family history includes Cancer in her mother; Heart attack in her father and paternal grandmother.  SOCIAL HISTORY:  reports that she has been smoking cigarettes. She has a 11.50 pack-year smoking history. She has never used smokeless tobacco. She reports previous alcohol use. She reports that she does not use drugs.  ALLERGIES: Amoxicillin, Aspirin, Benadryl [diphenhydramine], Ciprocin-fluocin-procin [fluocinolone], Ciprofloxacin, Morphine and related, Mushroom extract complex, Penicillins, Sulfa  antibiotics, and Bactrim [sulfamethoxazole-trimethoprim]  MEDICATIONS:  Current Outpatient Medications  Medication Sig Dispense Refill     acetaminophen (TYLENOL) 325 MG tablet Take 325-650 mg by mouth every 6 (six) hours as needed (for headaches or pain).     albuterol (PROVENTIL HFA;VENTOLIN HFA) 108 (90 Base) MCG/ACT inhaler Inhale 2 puffs into the lungs every 6 (six) hours as needed for wheezing or shortness of breath.     albuterol (PROVENTIL) (2.5 MG/3ML) 0.083% nebulizer solution Take 3 mLs (2.5 mg total) by nebulization every 6 (six) hours as needed for wheezing. 75 mL 12   esomeprazole (NEXIUM) 20 MG capsule Take 20 mg by mouth daily as needed (acid reflux).      folic acid (FOLVITE) 1 MG tablet Take 2 tablets (2 mg total) by mouth daily. 60 tablet 5   LORazepam (ATIVAN) 0.5 MG tablet Take 0.5 mg by mouth 2 (two) times daily.     Multiple Vitamin (MULTIVITAMIN WITH MINERALS) TABS tablet Take 1 tablet by mouth daily.     oxyCODONE-acetaminophen (PERCOCET) 10-325 MG tablet Take 1 tablet by mouth every 6 (six) hours as needed for pain. (Patient taking differently: Take 1 tablet by mouth 3 (three) times daily. ) 12 tablet 0   vitamin B-12 (CYANOCOBALAMIN) 500 MCG tablet Take 2 tablets (1,000 mcg total) by mouth daily. 180 tablet 3   nicotine (NICODERM CQ - DOSED IN MG/24 HOURS) 21 mg/24hr patch Place 1 patch (21 mg total) onto the skin daily. (Patient not taking: Reported on 07/07/2019) 28 patch 3   No current facility-administered medications for this encounter.     REVIEW OF SYSTEMS:  Notable for that above.   PHYSICAL EXAM:  vitals were not taken for this visit.   General: Alert and oriented, in no acute distress   LABORATORY DATA:  Lab Results  Component Value Date   WBC 6.5 06/30/2019   HGB 16.1 (H) 06/30/2019   HCT 48.4 (H) 06/30/2019   MCV 100.2 (H) 06/30/2019   PLT 205 06/30/2019   CMP     Component Value Date/Time   NA 136 06/30/2019 1249   K 4.2 06/30/2019 1249   CL 98 06/30/2019 1249   CO2 30 06/30/2019 1249   GLUCOSE 96 06/30/2019 1249   BUN 8 06/30/2019 1249   CREATININE 0.87 06/30/2019  1249   CALCIUM 9.1 06/30/2019 1249   PROT 6.9 06/30/2019 1249   ALBUMIN 3.8 06/30/2019 1249   AST 12 (L) 06/30/2019 1249   ALT 10 06/30/2019 1249   ALKPHOS 100 06/30/2019 1249   BILITOT 0.5 06/30/2019 1249   GFRNONAA >60 06/30/2019 1249   GFRAA >60 06/30/2019 1249      Lab Results  Component Value Date   TSH 1.330 06/30/2019     RADIOGRAPHY: Nm Pet Image Initial (pi) Skull Base To Thigh  Result Date: 06/23/2019 CLINICAL DATA:  Initial treatment strategy for malignant neoplasm of the neck. EXAM: NUCLEAR MEDICINE PET SKULL BASE TO THIGH TECHNIQUE: 9.6 mCi F-18 FDG was injected intravenously. Full-ring PET imaging was performed from the skull base to thigh after the radiotracer. CT data was obtained and used for attenuation correction and anatomic localization. Fasting blood glucose: 116 mg/dl COMPARISON:  CT neck 05/19/2019. FINDINGS: Mediastinal blood pool activity: SUV max 3.0 Liver activity: SUV max NA NECK: Necrotic appearing right level-II lymph node measures 2.8 x 3.2 cm with an SUV 12.1. No additional abnormal hypermetabolism. Incidental CT findings: None. CHEST: No hypermetabolic mediastinal, hilar or axillary lymph nodes. No hypermetabolic pulmonary  nodules. Incidental CT findings: Atherosclerotic calcification of the aorta and coronary arteries. No pericardial or pleural effusion. ABDOMEN/PELVIS: No abnormal hypermetabolism in the liver, adrenal glands, spleen or pancreas. No hypermetabolic lymph nodes. Incidental CT findings: Liver, gallbladder and adrenal glands are unremarkable. Stones are seen in the kidneys bilaterally. Spleen, pancreas, stomach and bowel are unremarkable. Atherosclerotic calcification of the aorta without aneurysm. SKELETON: No abnormal osseous hypermetabolism. Incidental CT findings: Left hip arthroplasty. Degenerative changes in the spine. IMPRESSION: 1. Hypermetabolic right level 2 lymph node in the neck. No additional abnormal hypermetabolism in the neck, chest,  abdomen or pelvis. 2. Bilateral renal stones. 3. Aortic atherosclerosis (ICD10-170.0). Coronary artery calcification. Electronically Signed   By: Lorin Picket M.D.   On: 06/23/2019 15:20      IMPRESSION/PLAN:  This is a delightful patient with head and neck cancer. Work up continues, unknown primary.  Surgery and biopsies later this month. I explained that I will very likely recommend radiotherapy for this patient adjuvantly.  We discussed the potential risks, benefits, and side effects of radiotherapy. We talked in detail about acute and late effects. We discussed that some of the most bothersome acute effects may be mucositis, dysgeusia, salivary changes, skin irritation, hair loss, dehydration, weight loss and fatigue. We talked about late effects which include but are not necessarily limited to dysphagia. No guarantees of treatment were given.    Simulation (treatment planning) will take place after surgical clearance  We also discussed that the treatment of head and neck cancer is a multidisciplinary process to maximize treatment outcomes and quality of life. For this reason the following referrals have been or will be made:   Medical oncology to discuss chemotherapy    Nutritionist for nutrition support during and after treatment.   Speech language pathology for swallowing and/or speech therapy.   Social work for social support.    Physical therapy due to risk of lymphedema in neck and deconditioning.   I asked the patient today about tobacco use. The patient uses tobacco.  I advised the patient to quit. Services were offered by me today including outpatient counseling and pharmacotherapy. I assessed for the willingness to attempt to quit and provided encouragement and demonstrated willingness to make referrals and/or prescriptions to help the patient attempt to quit. The patient has follow-up with the oncologic team to touch base on their tobacco use and /or cessation efforts.  Over  3 minutes were spent on this issue. She will pick up patches as Rxd and call [id number]. Her quit date will be 07-11-19.  This encounter was provided by telemedicine platform phone - she couldn't access Webex.  The patient has given verbal consent for this type of encounter and has been advised to only accept a meeting of this type in a secure network environment. The time spent during this encounter was at least 25 minutes. The attendants for this meeting include Eppie Gibson  and [facility].  During the encounter, Eppie Gibson was located at [facility] Radiation Oncology Department.  Stacey Herman was located at home.    __________________________________________   Eppie Gibson, MD   This document serves as a record of services personally performed by Eppie Gibson, MD. It was created on her behalf by Wilburn Mylar, a trained medical scribe. The creation of this record is based on the scribe's personal observations and the provider's statements to them. This document has been checked and approved by the attending provider.

## 2019-07-08 ENCOUNTER — Telehealth: Payer: Self-pay | Admitting: *Deleted

## 2019-07-08 NOTE — Telephone Encounter (Signed)
Oncology Nurse Navigator Documentation  Called pt in follow-up to yesterday's consult with Dr. Isidore Moos.  She asked that I call her tomorrow around noon as she was in car and did not have ability to take notes.  I voiced agreement.  Gayleen Orem, RN, BSN Head & Neck Oncology Nurse [place] at [place] [phone]

## 2019-07-09 ENCOUNTER — Telehealth: Payer: Self-pay | Admitting: *Deleted

## 2019-07-09 ENCOUNTER — Telehealth: Payer: Self-pay | Admitting: Nutrition

## 2019-07-09 NOTE — Telephone Encounter (Signed)
Left message re nutrition appointment. Schedule mailed.

## 2019-07-09 NOTE — Telephone Encounter (Addendum)
Oncology Nurse Navigator Documentation  Spoke with Ms. Heffelfinger in follow-up to Wednesday's tele-consult with Dr. Isidore Moos.  Explained my role as her navigator, provided my phone number.  She confirmed 9/16 R neck dissection including likely R tonsillectomy with ENT Dr. Benjamine Mola.  I confirmed understanding of purpose for adjuvant XRT, explained I will monitor her post-surgical progress, coordinate follow-up with Dr. Isidore Moos for pre-RT planning.  I reiterated explanation of mutli-disciplinary approach to her care, she voiced understanding appts with SE, Nutrition, SLP and PT will be coordinated when she returns to [facility] s/p surgery.  She stated she will need transportation assistance during tmt, voiced understanding I will facilitate coordination of [place] service. I encouraged her to call me with questions/concerns, she voiced agreement to do so.  Gayleen Orem, RN, BSN Head & Neck Oncology Nurse [place] at [place] [phone]

## 2019-07-12 ENCOUNTER — Other Ambulatory Visit (HOSPITAL_COMMUNITY)
Admission: RE | Admit: 2019-07-12 | Discharge: 2019-07-12 | Disposition: A | Discharge status: Home / Self Care | Payer: Medicare Other | Source: Ambulatory Visit | Attending: Otolaryngology | Admitting: Otolaryngology

## 2019-07-12 LAB — SARS CORONAVIRUS 2 (TAT 6-24 HRS): SARS Coronavirus 2: NEGATIVE

## 2019-07-13 ENCOUNTER — Other Ambulatory Visit: Payer: Self-pay

## 2019-07-13 ENCOUNTER — Encounter (HOSPITAL_COMMUNITY): Payer: Self-pay | Admitting: *Deleted

## 2019-07-13 NOTE — Anesthesia Preprocedure Evaluation (Addendum)
Anesthesia Evaluation  Patient identified by MRN, date of birth, ID band Patient awake    Reviewed: Allergy & Precautions, NPO status , Patient's Chart, lab work & pertinent test results  Airway Mallampati: I  TM Distance: >3 FB Neck ROM: Full    Dental  (+) Edentulous Upper, Edentulous Lower   Pulmonary COPD, Patient abstained from smoking., former smoker,     + decreased breath sounds      Cardiovascular negative cardio ROS   Rhythm:Regular Rate:Normal     Neuro/Psych  Headaches, Anxiety    GI/Hepatic Neg liver ROS, GERD  ,  Endo/Other  negative endocrine ROS  Renal/GU      Musculoskeletal  (+) Arthritis ,   Abdominal Normal abdominal exam  (+)   Peds  Hematology negative hematology ROS (+)   Anesthesia Other Findings   Reproductive/Obstetrics                            Anesthesia Physical Anesthesia Plan  ASA: III  Anesthesia Plan: General   Post-op Pain Management:    Induction: Intravenous  PONV Risk Score and Plan: 4 or greater and Ondansetron, Dexamethasone, Midazolam and Scopolamine patch - Pre-op  Airway Management Planned: Oral ETT  Additional Equipment: None  Intra-op Plan:   Post-operative Plan: Possible Post-op intubation/ventilation  Informed Consent: I have reviewed the patients History and Physical, chart, labs and discussed the procedure including the risks, benefits and alternatives for the proposed anesthesia with the patient or authorized representative who has indicated his/her understanding and acceptance.     Dental advisory given  Plan Discussed with: CRNA  Anesthesia Plan Comments:       Anesthesia Quick Evaluation

## 2019-07-14 ENCOUNTER — Encounter (HOSPITAL_COMMUNITY): Payer: Self-pay | Admitting: Certified Registered"

## 2019-07-14 ENCOUNTER — Inpatient Hospital Stay (HOSPITAL_COMMUNITY): Payer: Medicare Other | Admitting: Certified Registered"

## 2019-07-14 ENCOUNTER — Other Ambulatory Visit: Payer: Self-pay

## 2019-07-14 ENCOUNTER — Encounter (HOSPITAL_COMMUNITY)
Admission: RE | Disposition: A | Discharge status: Home / Self Care | Payer: Self-pay | Source: Home / Self Care | Attending: Otolaryngology

## 2019-07-14 ENCOUNTER — Inpatient Hospital Stay (HOSPITAL_COMMUNITY)
Admission: RE | Admit: 2019-07-14 | Discharge: 2019-07-16 | DRG: 822 | Disposition: A | Discharge status: Home / Self Care | Payer: Medicare Other | Attending: Otolaryngology | Admitting: Otolaryngology

## 2019-07-14 DIAGNOSIS — C801 Malignant (primary) neoplasm, unspecified: Secondary | ICD-10-CM | POA: Diagnosis present

## 2019-07-14 DIAGNOSIS — Z87891 Personal history of nicotine dependence: Secondary | ICD-10-CM

## 2019-07-14 DIAGNOSIS — D487 Neoplasm of uncertain behavior of other specified sites: Secondary | ICD-10-CM | POA: Diagnosis not present

## 2019-07-14 DIAGNOSIS — R590 Localized enlarged lymph nodes: Secondary | ICD-10-CM | POA: Diagnosis not present

## 2019-07-14 DIAGNOSIS — K219 Gastro-esophageal reflux disease without esophagitis: Secondary | ICD-10-CM | POA: Diagnosis present

## 2019-07-14 DIAGNOSIS — C770 Secondary and unspecified malignant neoplasm of lymph nodes of head, face and neck: Secondary | ICD-10-CM | POA: Diagnosis present

## 2019-07-14 DIAGNOSIS — C760 Malignant neoplasm of head, face and neck: Secondary | ICD-10-CM | POA: Diagnosis not present

## 2019-07-14 DIAGNOSIS — J449 Chronic obstructive pulmonary disease, unspecified: Secondary | ICD-10-CM | POA: Diagnosis present

## 2019-07-14 DIAGNOSIS — Z20828 Contact with and (suspected) exposure to other viral communicable diseases: Secondary | ICD-10-CM | POA: Diagnosis present

## 2019-07-14 DIAGNOSIS — C4442 Squamous cell carcinoma of skin of scalp and neck: Secondary | ICD-10-CM | POA: Diagnosis not present

## 2019-07-14 DIAGNOSIS — Z9889 Other specified postprocedural states: Secondary | ICD-10-CM

## 2019-07-14 HISTORY — DX: Personal history of other medical treatment: Z92.89

## 2019-07-14 HISTORY — PX: RADICAL NECK DISSECTION: SHX2284

## 2019-07-14 HISTORY — PX: ESOPHAGOSCOPY: SHX5534

## 2019-07-14 HISTORY — DX: Malignant (primary) neoplasm, unspecified: C80.1

## 2019-07-14 HISTORY — PX: TONSILLECTOMY: SHX5217

## 2019-07-14 HISTORY — PX: FLEXIBLE BRONCHOSCOPY: SHX5094

## 2019-07-14 HISTORY — PX: DIRECT LARYNGOSCOPY: SHX5326

## 2019-07-14 SURGERY — DISSECTION, NECK, RADICAL
Anesthesia: General | Site: Neck | Laterality: Right

## 2019-07-14 MED ORDER — ALBUTEROL SULFATE (2.5 MG/3ML) 0.083% IN NEBU: 2.5000 mg | INHALATION_SOLUTION | Freq: Four times a day (QID) | RESPIRATORY_TRACT | Status: DC | PRN

## 2019-07-14 MED ORDER — SUGAMMADEX SODIUM 200 MG/2ML IV SOLN
INTRAVENOUS | Status: DC | PRN
  Administered 2019-07-14 (×2): 200 mg via INTRAVENOUS

## 2019-07-14 MED ORDER — ACETAMINOPHEN 10 MG/ML IV SOLN
1000.0000 mg | Freq: Once | INTRAVENOUS | Status: DC | PRN
  Administered 2019-07-14: 13:00:00 1000 mg via INTRAVENOUS

## 2019-07-14 MED ORDER — MEPERIDINE HCL 25 MG/ML IJ SOLN: 6.2500 mg | INTRAMUSCULAR | Status: DC | PRN

## 2019-07-14 MED ORDER — DEXAMETHASONE SODIUM PHOSPHATE 10 MG/ML IJ SOLN
INTRAMUSCULAR | Status: DC | PRN
  Administered 2019-07-14: 4 mg via INTRAVENOUS

## 2019-07-14 MED ORDER — ROCURONIUM BROMIDE 10 MG/ML (PF) SYRINGE
PREFILLED_SYRINGE | INTRAVENOUS | Status: AC
  Filled 2019-07-14: qty 10

## 2019-07-14 MED ORDER — LIDOCAINE 2% (20 MG/ML) 5 ML SYRINGE
INTRAMUSCULAR | Status: DC | PRN
  Administered 2019-07-14: 50 mg via INTRAVENOUS

## 2019-07-14 MED ORDER — MIDAZOLAM HCL 5 MG/5ML IJ SOLN
INTRAMUSCULAR | Status: DC | PRN
  Administered 2019-07-14: 2 mg via INTRAVENOUS

## 2019-07-14 MED ORDER — MIDAZOLAM HCL 2 MG/2ML IJ SOLN
INTRAMUSCULAR | Status: AC
  Filled 2019-07-14: qty 2

## 2019-07-14 MED ORDER — LIDOCAINE-EPINEPHRINE 1 %-1:100000 IJ SOLN
INTRAMUSCULAR | Status: DC | PRN
  Administered 2019-07-14: 6 mL

## 2019-07-14 MED ORDER — OXYCODONE HCL 5 MG PO TABS
15.0000 mg | ORAL_TABLET | Freq: Four times a day (QID) | ORAL | Status: DC
  Administered 2019-07-14 (×2): 15 mg via ORAL
  Filled 2019-07-14 (×2): qty 3

## 2019-07-14 MED ORDER — EPINEPHRINE HCL (NASAL) 0.1 % NA SOLN
NASAL | Status: AC
  Filled 2019-07-14: qty 30

## 2019-07-14 MED ORDER — SODIUM CHLORIDE 0.9 % IV SOLN
INTRAVENOUS | Status: DC | PRN
  Administered 2019-07-14: 25 ug/min via INTRAVENOUS

## 2019-07-14 MED ORDER — LACTATED RINGERS IV SOLN: INTRAVENOUS | Status: DC

## 2019-07-14 MED ORDER — OXYMETAZOLINE HCL 0.05 % NA SOLN
NASAL | Status: AC
  Filled 2019-07-14: qty 30

## 2019-07-14 MED ORDER — ACETAMINOPHEN 160 MG/5ML PO SOLN: 650.0000 mg | ORAL | Status: DC | PRN

## 2019-07-14 MED ORDER — ALBUTEROL SULFATE HFA 108 (90 BASE) MCG/ACT IN AERS: 2.0000 | INHALATION_SPRAY | Freq: Four times a day (QID) | RESPIRATORY_TRACT | Status: DC | PRN

## 2019-07-14 MED ORDER — PANTOPRAZOLE SODIUM 40 MG PO TBEC
40.0000 mg | DELAYED_RELEASE_TABLET | Freq: Every day | ORAL | Status: DC
  Administered 2019-07-14 – 2019-07-15 (×2): 40 mg via ORAL
  Filled 2019-07-14 (×2): qty 1

## 2019-07-14 MED ORDER — SUCCINYLCHOLINE CHLORIDE 200 MG/10ML IV SOSY
PREFILLED_SYRINGE | INTRAVENOUS | Status: AC
  Filled 2019-07-14: qty 10

## 2019-07-14 MED ORDER — LIDOCAINE-EPINEPHRINE 1 %-1:100000 IJ SOLN
INTRAMUSCULAR | Status: AC
  Filled 2019-07-14: qty 1

## 2019-07-14 MED ORDER — ACETAMINOPHEN 160 MG/5ML PO SOLN: 325.0000 mg | Freq: Once | ORAL | Status: DC | PRN

## 2019-07-14 MED ORDER — FENTANYL CITRATE (PF) 100 MCG/2ML IJ SOLN
INTRAMUSCULAR | Status: DC | PRN
  Administered 2019-07-14: 100 ug via INTRAVENOUS
  Administered 2019-07-14: 150 ug via INTRAVENOUS

## 2019-07-14 MED ORDER — CLINDAMYCIN PHOSPHATE 600 MG/50ML IV SOLN
600.0000 mg | Freq: Three times a day (TID) | INTRAVENOUS | Status: AC
  Administered 2019-07-14 – 2019-07-15 (×3): 600 mg via INTRAVENOUS
  Filled 2019-07-14 (×3): qty 50

## 2019-07-14 MED ORDER — PROPOFOL 10 MG/ML IV BOLUS
INTRAVENOUS | Status: DC | PRN
  Administered 2019-07-14: 20 mg via INTRAVENOUS
  Administered 2019-07-14: 130 mg via INTRAVENOUS

## 2019-07-14 MED ORDER — SODIUM CHLORIDE 0.9 % IR SOLN
Status: DC | PRN
  Administered 2019-07-14: 1

## 2019-07-14 MED ORDER — SCOPOLAMINE 1 MG/3DAYS TD PT72
MEDICATED_PATCH | TRANSDERMAL | Status: DC | PRN
  Administered 2019-07-14: 1 via TRANSDERMAL

## 2019-07-14 MED ORDER — LACTATED RINGERS IV SOLN
INTRAVENOUS | Status: DC | PRN
  Administered 2019-07-14: 08:00:00 via INTRAVENOUS

## 2019-07-14 MED ORDER — LORAZEPAM 0.5 MG PO TABS
0.5000 mg | ORAL_TABLET | Freq: Two times a day (BID) | ORAL | Status: DC | PRN
  Filled 2019-07-14: qty 1

## 2019-07-14 MED ORDER — PROPOFOL 10 MG/ML IV BOLUS
INTRAVENOUS | Status: AC
  Filled 2019-07-14: qty 20

## 2019-07-14 MED ORDER — HYDROMORPHONE HCL 1 MG/ML IJ SOLN
0.2500 mg | INTRAMUSCULAR | Status: DC | PRN
  Administered 2019-07-14: 13:00:00 0.5 mg via INTRAVENOUS

## 2019-07-14 MED ORDER — ACETAMINOPHEN 650 MG RE SUPP: 650.0000 mg | RECTAL | Status: DC | PRN

## 2019-07-14 MED ORDER — SCOPOLAMINE 1 MG/3DAYS TD PT72
MEDICATED_PATCH | TRANSDERMAL | Status: AC
  Filled 2019-07-14: qty 1

## 2019-07-14 MED ORDER — NICOTINE 21 MG/24HR TD PT24
21.0000 mg | MEDICATED_PATCH | Freq: Every day | TRANSDERMAL | Status: DC
  Administered 2019-07-15: 21 mg via TRANSDERMAL
  Filled 2019-07-14: qty 1

## 2019-07-14 MED ORDER — ONDANSETRON HCL 4 MG/2ML IJ SOLN
INTRAMUSCULAR | Status: DC | PRN
  Administered 2019-07-14: 4 mg via INTRAVENOUS

## 2019-07-14 MED ORDER — LIDOCAINE 2% (20 MG/ML) 5 ML SYRINGE
INTRAMUSCULAR | Status: AC
  Filled 2019-07-14: qty 5

## 2019-07-14 MED ORDER — HYDROMORPHONE HCL 1 MG/ML IJ SOLN
INTRAMUSCULAR | Status: AC
  Administered 2019-07-14: 13:00:00 0.5 mg via INTRAVENOUS
  Filled 2019-07-14: qty 1

## 2019-07-14 MED ORDER — DEXMEDETOMIDINE HCL IN NACL 80 MCG/20ML IV SOLN
INTRAVENOUS | Status: AC
  Filled 2019-07-14: qty 20

## 2019-07-14 MED ORDER — CLINDAMYCIN PHOSPHATE 900 MG/50ML IV SOLN
INTRAVENOUS | Status: AC
  Filled 2019-07-14: qty 50

## 2019-07-14 MED ORDER — PROMETHAZINE HCL 25 MG/ML IJ SOLN: 6.2500 mg | INTRAMUSCULAR | Status: DC | PRN

## 2019-07-14 MED ORDER — DEXMEDETOMIDINE HCL IN NACL 200 MCG/50ML IV SOLN
INTRAVENOUS | Status: DC | PRN
  Administered 2019-07-14 (×3): 8 ug via INTRAVENOUS

## 2019-07-14 MED ORDER — FENTANYL CITRATE (PF) 250 MCG/5ML IJ SOLN
INTRAMUSCULAR | Status: AC
  Filled 2019-07-14: qty 5

## 2019-07-14 MED ORDER — ACETAMINOPHEN 325 MG PO TABS: 325.0000 mg | ORAL_TABLET | Freq: Once | ORAL | Status: DC | PRN

## 2019-07-14 MED ORDER — ROCURONIUM BROMIDE 10 MG/ML (PF) SYRINGE
PREFILLED_SYRINGE | INTRAVENOUS | Status: DC | PRN
  Administered 2019-07-14: 50 mg via INTRAVENOUS

## 2019-07-14 MED ORDER — CLINDAMYCIN PHOSPHATE 900 MG/50ML IV SOLN
INTRAVENOUS | Status: DC | PRN
  Administered 2019-07-14: 900 mg via INTRAVENOUS

## 2019-07-14 MED ORDER — BACITRACIN ZINC 500 UNIT/GM EX OINT
TOPICAL_OINTMENT | CUTANEOUS | Status: AC
  Filled 2019-07-14: qty 28.35

## 2019-07-14 MED ORDER — KCL IN DEXTROSE-NACL 20-5-0.45 MEQ/L-%-% IV SOLN
INTRAVENOUS | Status: DC
  Administered 2019-07-14 – 2019-07-15 (×4): via INTRAVENOUS
  Filled 2019-07-14 (×5): qty 1000

## 2019-07-14 MED ORDER — 0.9 % SODIUM CHLORIDE (POUR BTL) OPTIME
TOPICAL | Status: DC | PRN
  Administered 2019-07-14: 09:00:00 1000 mL

## 2019-07-14 MED ORDER — PHENYLEPHRINE 40 MCG/ML (10ML) SYRINGE FOR IV PUSH (FOR BLOOD PRESSURE SUPPORT)
PREFILLED_SYRINGE | INTRAVENOUS | Status: AC
  Filled 2019-07-14: qty 10

## 2019-07-14 MED ORDER — ACETAMINOPHEN 10 MG/ML IV SOLN
INTRAVENOUS | Status: AC
  Administered 2019-07-14: 13:00:00 1000 mg via INTRAVENOUS
  Filled 2019-07-14: qty 100

## 2019-07-14 MED ORDER — PHENYLEPHRINE 40 MCG/ML (10ML) SYRINGE FOR IV PUSH (FOR BLOOD PRESSURE SUPPORT)
PREFILLED_SYRINGE | INTRAVENOUS | Status: DC | PRN
  Administered 2019-07-14 (×3): 80 ug via INTRAVENOUS

## 2019-07-14 SURGICAL SUPPLY — 61 items
APPLIER CLIP 9.375 SM OPEN (CLIP)
ATTRACTOMAT 16X20 MAGNETIC DRP (DRAPES) ×5 IMPLANT
BLADE SURG 15 STRL LF DISP TIS (BLADE) ×3 IMPLANT
BLADE SURG 15 STRL SS (BLADE) ×2
CANISTER SUCT 3000ML PPV (MISCELLANEOUS) ×5 IMPLANT
CATH ROBINSON RED A/P 10FR (CATHETERS) IMPLANT
CLEANER TIP ELECTROSURG 2X2 (MISCELLANEOUS) ×5 IMPLANT
CLIP APPLIE 9.375 SM OPEN (CLIP) IMPLANT
CONT SPEC 4OZ CLIKSEAL STRL BL (MISCELLANEOUS) ×15 IMPLANT
CORD BIPOLAR FORCEPS 12FT (ELECTRODE) ×5 IMPLANT
COVER SURGICAL LIGHT HANDLE (MISCELLANEOUS) ×5 IMPLANT
COVER WAND RF STERILE (DRAPES) ×5 IMPLANT
DECANTER SPIKE VIAL GLASS SM (MISCELLANEOUS) ×5 IMPLANT
DERMABOND ADVANCED (GAUZE/BANDAGES/DRESSINGS) ×2
DERMABOND ADVANCED .7 DNX12 (GAUZE/BANDAGES/DRESSINGS) ×3 IMPLANT
DRAIN CHANNEL 10F 3/8 F FF (DRAIN) ×5 IMPLANT
DRAIN CHANNEL 10M FLAT 3/4 FLT (DRAIN) ×5 IMPLANT
DRAPE HALF SHEET 40X57 (DRAPES) ×5 IMPLANT
ELECT COATED BLADE 2.86 ST (ELECTRODE) ×5 IMPLANT
ELECT REM PT RETURN 9FT ADLT (ELECTROSURGICAL) ×5
ELECTRODE REM PT RTRN 9FT ADLT (ELECTROSURGICAL) ×3 IMPLANT
EVACUATOR SILICONE 100CC (DRAIN) ×10 IMPLANT
GAUZE 4X4 16PLY RFD (DISPOSABLE) ×10 IMPLANT
GAUZE SPONGE 4X4 12PLY STRL (GAUZE/BANDAGES/DRESSINGS) IMPLANT
GLOVE BIO SURGEON STRL SZ7.5 (GLOVE) ×5 IMPLANT
GLOVE ECLIPSE 7.5 STRL STRAW (GLOVE) ×5 IMPLANT
GOWN STRL REUS W/ TWL LRG LVL3 (GOWN DISPOSABLE) ×12 IMPLANT
GOWN STRL REUS W/TWL LRG LVL3 (GOWN DISPOSABLE) ×8
GUARD TEETH (MISCELLANEOUS) ×5 IMPLANT
KIT BASIN OR (CUSTOM PROCEDURE TRAY) ×5 IMPLANT
KIT TURNOVER KIT B (KITS) ×5 IMPLANT
LOOP VESSEL MAXI BLUE (MISCELLANEOUS) ×5 IMPLANT
NEEDLE HYPO 25GX1X1/2 BEV (NEEDLE) ×5 IMPLANT
NS IRRIG 1000ML POUR BTL (IV SOLUTION) ×5 IMPLANT
PACK SURGICAL SETUP 50X90 (CUSTOM PROCEDURE TRAY) ×5 IMPLANT
PAD ARMBOARD 7.5X6 YLW CONV (MISCELLANEOUS) ×10 IMPLANT
PENCIL BUTTON HOLSTER BLD 10FT (ELECTRODE) ×5 IMPLANT
SHEARS HARMONIC 9CM CVD (BLADE) ×5 IMPLANT
SNYDER BIF Y CONN (MISCELLANEOUS) IMPLANT
SOL ANTI FOG 6CC (MISCELLANEOUS) ×3 IMPLANT
SOLUTION ANTI FOG 6CC (MISCELLANEOUS) ×2
SPONGE LAP 18X18 RF (DISPOSABLE) ×5 IMPLANT
SPONGE TONSIL TAPE 1 RFD (DISPOSABLE) ×5 IMPLANT
STAPLER VISISTAT 35W (STAPLE) ×5 IMPLANT
SUT ETHILON 3 0 PS 1 (SUTURE) ×5 IMPLANT
SUT SILK 2 0 SH CR/8 (SUTURE) ×5 IMPLANT
SUT SILK 3 0 REEL (SUTURE) ×10 IMPLANT
SUT SILK 4 0 REEL (SUTURE) IMPLANT
SUT VIC AB 3-0 SH 27 (SUTURE) ×8
SUT VIC AB 3-0 SH 27X BRD (SUTURE) ×12 IMPLANT
SUT VICRYL 4-0 PS2 18IN ABS (SUTURE) ×10 IMPLANT
SYR BULB 3OZ (MISCELLANEOUS) ×5 IMPLANT
TOWEL GREEN STERILE FF (TOWEL DISPOSABLE) ×10 IMPLANT
TRAY ENT MC OR (CUSTOM PROCEDURE TRAY) ×5 IMPLANT
TRAY FOLEY MTR SLVR 14FR STAT (SET/KITS/TRAYS/PACK) IMPLANT
TUBE CONNECTING 12'X1/4 (SUCTIONS) ×1
TUBE CONNECTING 12X1/4 (SUCTIONS) ×4 IMPLANT
TUBE FEEDING 10FR FLEXIFLO (MISCELLANEOUS) IMPLANT
TUBE SALEM SUMP 12R W/ARV (TUBING) ×5 IMPLANT
WAND COBLATOR 70 EVAC XTRA (SURGICAL WAND) ×5 IMPLANT
WATER STERILE IRR 1000ML POUR (IV SOLUTION) ×5 IMPLANT

## 2019-07-14 NOTE — Discharge Instructions (Signed)
Neck Dissection, Care After This sheet gives you information about how to care for yourself after your procedure. Your health care provider may also give you more specific instructions. If you have problems or questions, contact your health care provider. What can I expect after the procedure? After the procedure, it is common to have:  Pain or soreness.  Stiffness in your neck or shoulder.  Burning or tingling sensations in the area where you had surgery.  Weakness or numbness if any nerves or muscles were removed. Follow these instructions at home: Medicines  Take over-the-counter and prescription medicines only as told by your health care provider.  Ask your health care provider if the medicine prescribed to you: ? Requires you to avoid driving or using heavy machinery. ? Can cause constipation. You may need to take actions to prevent or treat constipation, such as:  Drink enough fluid to keep your urine pale yellow.  Take over-the-counter or prescription medicines.  Eat foods that are high in fiber, such as fresh fruits and vegetables, whole grains, and beans.  Limit foods that are high in fat and processed sugars, such as fried and sweet foods. Incision care  Follow instructions from your health care provider about how to take care of your incision. Make sure you: ? Wash your hands with soap and water before and after you change your bandage (dressing). If soap and water are not available, use hand sanitizer. ? Change your dressing as told by your health care provider. ? Leave stitches (sutures), skin glue, or adhesive strips in place. These skin closures may need to stay in place for 2 weeks or longer. If adhesive strip edges start to loosen and curl up, you may trim the loose edges. Do not remove adhesive strips completely unless your health care provider tells you to do that.  Check your incision area every day for signs of infection. Check for: ? Redness, swelling, or  increasing pain. ? Fluid or blood. ? Warmth. ? Pus or a bad smell.  If you have drainage tubes, follow instructions from your health care provider about how to care for them. Bathing  Do not take baths, swim, or use a hot tub until your health care provider approves. Ask your health care provider if you may take showers. You may only be allowed to take sponge baths.  If your health care provider approves bathing and showering, cover the dressing with a watertight covering to protect it from water. Do not let the bandage get wet.  Keep the dressing dry until your health care provider says it can be removed. Activity  Return to your normal activities as told by your health care provider. Ask your health care provider what activities are safe for you.  Do not lift anything that is heavier than 10 lb (4.5 kg), or the limit that you are told, until your health care provider says that it is safe.  Exercise only as told by your health care provider or physical therapist. ? Do not exercise the neck area until your health care provider approves. ? If muscles or nerves were removed during your procedure, your health care provider may recommend certain exercises or physical therapy to help you regain some of the motion in your upper body. General instructions  Try eating soft foods for a while after the procedure. This can help reduce pain if your throat is sore or if you have difficulty swallowing.  Do not use any products that contain nicotine or tobacco,  such as cigarettes, e-cigarettes, and chewing tobacco. These can delay incision healing after surgery. If you need help quitting, ask your health care provider.  Sleep and rest with your head raised (elevated) to help reduce swelling.  Stay out of direct sunlight. Your skin is more sensitive to the sun after surgery. Use sunblock and wear a wide-brimmed hat to protect your skin.  Keep all follow-up visits as told by your health care provider.  This is important. Contact a health care provider if:  You have pain that is not relieved by pain medicine.  Your neck or shoulder gets weaker or stiffer.  You have a fever.  You have redness, swelling, or increasing pain at the site of your incision.  You have fluid or blood coming from your incision.  Your incision feels warm to the touch.  You have pus or a bad smell coming from your incision. Get help right away if:  Your drain comes out or gets blocked.  You have difficulty breathing.  Your incision starts to open or come apart. Summary  After neck dissection surgery, it is common to have pain, soreness, and stiffness in your neck or shoulder.  Follow instructions from your health care provider about how to take care of your incision.  Try eating soft foods for a while after the procedure. This can help reduce pain if your throat is sore or if you have difficulty swallowing.  Keep all follow-up visits as told by your health care provider. This is important. This information is not intended to replace advice given to you by your health care provider. Make sure you discuss any questions you have with your health care provider. Document Released: 01/06/2012 Document Revised: 07/07/2018 Document Reviewed: 07/07/2018 Elsevier Patient Education  2020 [employer].

## 2019-07-14 NOTE — Transfer of Care (Signed)
Immediate Anesthesia Transfer of Care Note  Patient: Stacey Herman  Procedure(s) Performed: RADICAL NECK DISSECTION (Right Neck) DIRECT LARYNGOSCOPY (N/A Mouth) ESOPHAGOSCOPY (N/A Mouth) FLEXIBLE BRONCHOSCOPY (N/A Mouth) Right TONSILLECTOMY (Right Mouth)  Patient Location: PACU  Anesthesia Type:General  Level of Consciousness: awake, alert , oriented and patient cooperative  Airway & Oxygen Therapy: Patient Spontanous Breathing and Patient connected to nasal cannula oxygen  Post-op Assessment: Report given to RN, Post -op Vital signs reviewed and stable and Patient moving all extremities  Post vital signs: Reviewed and stable  Last Vitals:  Vitals Value Taken Time  BP 99/56 07/14/19 1152  Temp    Pulse 80 07/14/19 1155  Resp 16 07/14/19 1155  SpO2 94 % 07/14/19 1155  Vitals shown include unvalidated device data.  Last Pain:  Vitals:   07/14/19 0716  TempSrc:   PainSc: 0-No pain         Complications: No apparent anesthesia complications.

## 2019-07-14 NOTE — Anesthesia Procedure Notes (Signed)
Procedure Name: Intubation Date/Time: 07/14/2019 8:40 AM Performed by: Moshe Salisbury, CRNA Pre-anesthesia Checklist: Patient identified, Emergency Drugs available, Suction available and Patient being monitored Patient Re-evaluated:Patient Re-evaluated prior to induction Oxygen Delivery Method: Circle System Utilized Preoxygenation: Pre-oxygenation with 100% oxygen Induction Type: IV induction Ventilation: Mask ventilation without difficulty Laryngoscope Size: Mac and 3 Grade View: Grade I Tube type: Oral Tube size: 8.0 mm Number of attempts: 1 Airway Equipment and Method: Stylet Placement Confirmation: ETT inserted through vocal cords under direct vision,  positive ETCO2 and breath sounds checked- equal and bilateral Secured at: 21 cm Tube secured with: Tape Dental Injury: Teeth and Oropharynx as per pre-operative assessment

## 2019-07-14 NOTE — Op Note (Signed)
DATE OF PROCEDURE:  07/14/2019                              OPERATIVE REPORT  SURGEON:  Leta Baptist, MD  PREOPERATIVE DIAGNOSES: 1.  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  Right modified radical neck dissection. 2.  Right Tonsillectomy. 3.  Direct laryngoscopy 4.  Flexible bronchoscopy. 5.  Rigid esophagoscopy.  ANESTHESIA:  General endotracheal tube anesthesia.  COMPLICATIONS:  None.  ESTIMATED BLOOD LOSS: 25 ml  INDICATION FOR PROCEDURE:  Stacey Herman is a 63 y.o. female with a history of a right neck mass. Her CT scan showed a large 2.9 cm right neck necrotic lymph node.  The appearance was concerning for malignancy.  She subsequently underwent an ultrasound-guided fine needle aspiration biopsy.  The pathology was consistent with poorly differentiated squamous cell carcinoma. She subsequently underwent a PET scan.  The PET scan showed a highly metabolic right neck mass.  No other lesion was noted within the head and neck area.  Her laryngoscopy examination in July was also negative for obvious mucosal lesion.  Her case was discussed at the Tumor Board.  The oncologists have recommended panendoscopy and ipsilateral tonsillectomy in addition to right neck dissection.  Based on the above findings, the decision was made for the patient to undergo the adenotonsillectomy procedure. Likelihood of success in reducing symptoms was also discussed.  The risks, benefits, alternatives, and details of the procedures were discussed with the patient.  Questions were invited and answered.  Informed consent was obtained.  DESCRIPTION:  The patient was taken to the operating room and placed supine on the operating table.  General endotracheal tube anesthesia was administered by the anesthesiologist.  The patient was positioned and prepped and draped in the  standard fashion for panendoscopy.  A Dedo laryngoscope was used for the direct laryngoscopy examination.  The laryngoscope was inserted via the oral cavity into the pharynx.  Examination of the epiglottis, aryepiglottic folds, vallecula, piriform sinuses, and the vocal cords were all normal.  No suspicious mass or lesion was noted.  Examination of the nasopharynx, oropharynx, and hypopharynx also showed no evidence of mucosal lesion.  1+ tonsils were noted bilaterally.  The Dedo laryngoscope was withdrawn.  A rigid esophagoscope was reinserted through the oral cavity into the esophageal inlet.  The rigid esophagoscope was advanced into the distal esophagus.  Examination of the esophageal mucosa showed no evidence of lesion or mass.  The rigid esophagoscope was withdrawn.  A flexible bronchoscope was then inserted via the endotracheal tube into the trachea.  Examination of the trachea, carina, bilateral mainstem bronchi, and segmental takeoffs were all normal.  The bronchoscope was withdrawn.  A Crowe Davis mouthgag was inserted into the oral cavity for exposure.  The patient was noted to have 1+ tonsils bilaterally.  No ulceration or mass or lesion is noted today.  The right tonsil was grasped with an Allis clamp and retracted medially.  It was resected free from the underlying pharyngeal constrictor muscles with the Coblator device.  The tonsil was sent to the pathology department for permanent histologic identification.  The patient was then repositioned for right modified radical neck dissection.  1% lidocaine with 1-100,000 epinephrine was infiltrated at the planned site of incision.  A curvilinear hockey-stick incision was made from the right  mastoid tip down to the anterior neck.  The incision was carried down to the level of the platysma muscles.  Subplatysmal flap was elevated in the standard fashion.  The sternocleidomastoid muscles was retracted laterally, exposing a 3 cm right level 2 neck  mass.  Careful dissection was then carried out to free the right neck mass from the surrounding soft tissue.  It should be noted that the right neck mass was tightly adhered to the right internal jugular vein and the right spinal accessory nerve.  The spinal accessory nerve and the internal jugular vein were preserved.  The lymph node chain from the right level 2, level 3, and level 4 were dissected free and removed.  The surgical site was copiously irrigated.  A #10 JP drain was placed.  The incision was closed in layers with 4-0 Vicryl and Dermabond.  The care of the patient was turned over to the anesthesiologist.  The patient was awakened from anesthesia without difficulty.  The patient was extubated and transferred to the recovery room in good condition.  OPERATIVE FINDINGS: Negative panendoscopy.  The right tonsil and the right level 2, 3, and 4 cervical lymph nodes were removed.  SPECIMEN: Right tonsil and right neck dissection specimens.  FOLLOWUP CARE:  The patient will be admitted to the hospital for overnight observation.  Laqueta Bonaventura W Lacee Grey 07/14/2019 11:45 AM

## 2019-07-14 NOTE — H&P (Signed)
Cc: Right neck metastatic squamous cell carcinoma  HPI: The patient is a 63 year old female who returns today for her follow-up evaluation. The patient was previously seen on 07/31.  At that time she was noted to have a right neck mass. Her CT scan showed a large 2.9 cm right neck necrotic lymph node.  The appearance was concerning for malignancy.  She subsequently underwent an ultrasound-guided fine needle aspiration biopsy.  The pathology was consistent with poorly differentiated squamous cell carcinoma. She subsequently underwent a PET scan.  The PET scan showed a highly metabolic right neck mass.  No other lesion was noted within the head and neck area.  Her laryngoscopy examination in July was also negative for obvious mucosal lesion.  Her case was discussed at the Tumor Board.  The oncologists have recommended a panendoscopy and ipsilateral tonsillectomy in addition to right neck dissection.  The patient returns today reporting no new symptoms.  The patient is tolerating oral intake well.  She has noted decreased appetite. She denies any significant dysphagia or odynophagia. No other ENT, GI, or respiratory issue noted since the last visit.   Exam: General: Communicates without difficulty, well nourished, no acute distress. Head: Normocephalic, no evidence injury, no tenderness, facial buttresses intact without stepoff. Face/sinus: No tenderness to palpation and percussion. Facial movement is normal and symmetric. Eyes: PERRL, EOMI. No scleral icterus, conjunctivae clear. Neuro: CN II exam reveals vision grossly intact.  No nystagmus at any point of gaze. Ears: Auricles well formed without lesions.  Ear canals are intact without mass or lesion.  No erythema or edema is appreciated.  The TMs are intact without fluid. Nose: External evaluation reveals normal support and skin without lesions.  Dorsum is intact.  Anterior rhinoscopy reveals pink mucosa over anterior aspect of inferior turbinates and intact  septum.  No purulence noted. Oral:  Oral cavity and oropharynx are intact, symmetric, without erythema or edema.  Mucosa is moist without lesions. Neck: Full range of motion without pain.  A 3 cm mass of the right Level II neck is palpable. Thyroid bed within normal limits to palpation.  Parotid glands and submandibular glands equal bilaterally without mass.  Trachea is midline.   Assessment 1.  Poorly differentiated metastatic squamous cell carcinoma of the right neck. She has a 3 cm right Level II mass.  No obvious primary lesion is noted on today's exam.  Her recent laryngoscopy exam was also unremarkable.   Plan  1.  The physical exam findings and the PET scan results are extensively reviewed with the patient and her daughter.   2.  The treatment options are also extensively discussed.  The recommendation of panendoscopy, right tonsillectomy and right neck dissection are extensively discussed.  The risks, benefits, alternatives and details of the procedures are reviewed.  3.  The patient would like to proceed with the procedures.

## 2019-07-15 ENCOUNTER — Encounter (HOSPITAL_COMMUNITY): Payer: Self-pay | Admitting: Otolaryngology

## 2019-07-15 LAB — SURGICAL PATHOLOGY

## 2019-07-15 MED ORDER — OXYCODONE HCL 5 MG PO TABS
10.0000 mg | ORAL_TABLET | ORAL | Status: DC
  Administered 2019-07-15 – 2019-07-16 (×7): 10 mg via ORAL
  Filled 2019-07-15 (×8): qty 2

## 2019-07-15 NOTE — Anesthesia Postprocedure Evaluation (Signed)
Anesthesia Post Note  Patient: Stacey Herman  Procedure(s) Performed: RADICAL NECK DISSECTION (Right Neck) DIRECT LARYNGOSCOPY (N/A Mouth) ESOPHAGOSCOPY (N/A Mouth) FLEXIBLE BRONCHOSCOPY (N/A Mouth) Right TONSILLECTOMY (Right Mouth)     Patient location during evaluation: PACU Anesthesia Type: General Level of consciousness: awake and alert Pain management: pain level controlled Vital Signs Assessment: post-procedure vital signs reviewed and stable Respiratory status: spontaneous breathing, nonlabored ventilation, respiratory function stable and patient connected to nasal cannula oxygen Cardiovascular status: blood pressure returned to baseline and stable Postop Assessment: no apparent nausea or vomiting Anesthetic complications: no    Last Vitals:  Vitals:   07/15/19 0556 07/15/19 0800  BP: (!) 101/58   Pulse: (!) 55 (!) 57  Resp: 16 16  Temp: 36.8 C   SpO2: 98%                   Effie Berkshire

## 2019-07-15 NOTE — Progress Notes (Signed)
Subjective: Throat is sore.   Objective: Vital signs in last 24 hours: Temp:  [97 F (36.1 C)-98.3 F (36.8 C)] 98.3 F (36.8 C) (09/17 0043) Pulse Rate:  [66-92] 66 (09/17 0043) Resp:  [11-18] 16 (09/17 0043) BP: (94-131)/(46-73) 94/49 (09/17 0043) SpO2:  [90 %-100 %] 96 % (09/17 0043) Weight:  [95.3 kg] 95.3 kg (09/16 0701)  Right neck incision c/d/i. JP with serosanguinous drainage. Throat: No bleeding  Medications:  I have reviewed the patient's current medications. Scheduled: . nicotine  21 mg Transdermal Daily  . oxyCODONE  15 mg Oral Q6H  . pantoprazole  40 mg Oral Daily   Continuous: . clindamycin (CLEOCIN) IV 600 mg (07/14/19 2336)  . dextrose 5 % and 0.45 % NaCl with KCl 20 mEq/L 100 mL/hr at 07/14/19 1624    Assessment/Plan: POD #1 s/p right neck dissection, tonsillectomy, and panendocopy. - Pain control - Advance diet as tolerated. - Will likely d/c home tomorrow.   LOS: 1 day   Markeisha Mancias W Zakiya Sporrer 07/15/2019, 1:27 AM

## 2019-07-15 NOTE — Plan of Care (Signed)
  Problem: Education: Goal: Knowledge of General Education information will improve Description: Including pain rating scale, medication(s)/side effects and non-pharmacologic comfort measures Outcome: Progressing   Problem: Health Behavior/Discharge Planning: Goal: Ability to manage health-related needs will improve Outcome: Progressing   Problem: Clinical Measurements: Goal: Respiratory complications will improve Outcome: Progressing Goal: Cardiovascular complication will be avoided Outcome: Progressing   Problem: Activity: Goal: Risk for activity intolerance will decrease Outcome: Progressing   Problem: Nutrition: Goal: Adequate nutrition will be maintained Outcome: Progressing   Problem: Coping: Goal: Level of anxiety will decrease Outcome: Progressing   Problem: Elimination: Goal: Will not experience complications related to urinary retention Outcome: Progressing   Problem: Pain Managment: Goal: General experience of comfort will improve Outcome: Progressing   Problem: Safety: Goal: Ability to remain free from injury will improve Outcome: Progressing   Problem: Skin Integrity: Goal: Risk for impaired skin integrity will decrease Outcome: Progressing   

## 2019-07-16 ENCOUNTER — Telehealth: Payer: Self-pay | Admitting: Nutrition

## 2019-07-16 MED ORDER — OXYCODONE HCL 10 MG PO TABS: 10.0000 mg | ORAL_TABLET | ORAL | 0 refills | Status: AC

## 2019-07-16 NOTE — Discharge Summary (Signed)
Physician Discharge Summary  Patient ID: Stacey Herman MRN: [id number] DOB/AGE: 1956-08-21 63 y.o.  Admit date: 07/14/2019 Discharge date: 07/16/2019  Admission Diagnoses: Right neck metastatic squamous cell carcinoma  Discharge Diagnoses: Right neck metastatic squamous cell carcinoma Active Problems:   Status post neck dissection   Discharged Condition: good  Hospital Course: Pt had an uneventful hospital stay. Pt tolerated po well. No bleeding. No stridor. Incision c/d/i.  Consults: None  Significant Diagnostic Studies: None  Treatments: surgery: Right neck dissection, right tonsillectomy  Discharge Exam: Blood pressure (!) 117/49, pulse (!) 56, temperature 97.8 F (36.6 C), temperature source Oral, resp. rate 18, height 5\' 10"  (1.778 m), weight 95.3 kg, SpO2 93 %. Incision/Wound:c/d/i CN 11 intact  Disposition: Discharge disposition: 01-Home or Self Care       Discharge Instructions    Activity as tolerated - No restrictions   Complete by: As directed    Diet general   Complete by: As directed      Allergies as of 07/16/2019      Reactions   Amoxicillin Anaphylaxis, Hives   DID THE REACTION INVOLVE: Swelling of the face/tongue/throat, SOB, or low BP? Yes Sudden or severe rash/hives, skin peeling, or the inside of the mouth or nose? Yes Did it require medical treatment? Yes When did it last happen?Her 20's If all above answers are "NO", may proceed with cephalosporin use.   Aspirin Anaphylaxis, Hives, Swelling   Throat swelling   Benadryl [diphenhydramine] Anaphylaxis, Hives   Ciprocin-fluocin-procin [fluocinolone] Anaphylaxis, Hives   Ciprofloxacin Anaphylaxis   Morphine And Related Anaphylaxis, Hives, Swelling   Throat swelling   Mushroom Extract Complex Anaphylaxis, Hives   Penicillins Anaphylaxis, Hives   DID THE REACTION INVOLVE: Swelling of the face/tongue/throat, SOB, or low BP? Yes Sudden or severe rash/hives, skin peeling, or the inside of  the mouth or nose? Yes Did it require medical treatment? Yes When did it last happen?Her 20's If all above answers are "NO", may proceed with cephalosporin use.   Sulfa Antibiotics Anaphylaxis, Hives   Bactrim [sulfamethoxazole-trimethoprim] Hives, Swelling   Throat swelling      Medication List    TAKE these medications   albuterol 108 (90 Base) MCG/ACT inhaler Commonly known as: VENTOLIN HFA Inhale 2 puffs into the lungs every 6 (six) hours as needed for wheezing or shortness of breath.   albuterol (2.5 MG/3ML) 0.083% nebulizer solution Commonly known as: PROVENTIL Take 3 mLs (2.5 mg total) by nebulization every 6 (six) hours as needed for wheezing.   esomeprazole 20 MG capsule Commonly known as: NEXIUM Take 20 mg by mouth daily as needed (acid reflux).   folic acid 1 MG tablet Commonly known as: FOLVITE Take 2 tablets (2 mg total) by mouth daily.   LORazepam 0.5 MG tablet Commonly known as: ATIVAN Take 0.5 mg by mouth 2 (two) times daily as needed for sleep.   nicotine 21 mg/24hr patch Commonly known as: NICODERM CQ - dosed in mg/24 hours Place 21 mg onto the skin daily. Sometimes removes at night   nicotine 21 mg/24hr patch Commonly known as: NICODERM CQ - dosed in mg/24 hours Place 1 patch (21 mg total) onto the skin daily.   Oxycodone HCl 10 MG Tabs Take 1 tablet (10 mg total) by mouth every 4 (four) hours for 5 days. What changed:   medication strength  how much to take  when to take this   vitamin B-12 500 MCG tablet Commonly known as: CYANOCOBALAMIN Take 2 tablets (1,000 mcg  total) by mouth daily. What changed: how much to take      Follow-up Information    Leta Baptist, MD On 07/21/2019.   Specialty: Otolaryngology Why: at 2:10 pm Contact information: [address] [place] [address] [phone]           Signed: Burley Saver 07/16/2019, 7:22 AM

## 2019-07-16 NOTE — Telephone Encounter (Signed)
Called patient regarding pre-screening questions for 09/21 appointment, patient is notified.

## 2019-07-16 NOTE — Progress Notes (Signed)
Discharged Pt to home, instructions given and explained.

## 2019-07-16 NOTE — Progress Notes (Signed)
Pt given 0400 dose of OxyContin at this time per request; pt had difficulty sleeping and did not want to be awoken for medication. Pt was given the medication and stated that one of the pills was "stuck in my throat, it won't go down" pt was  Dry heaving without emesis. Speaking without difficulty and skin was pink warm and dry. Pt continued to have some heaving and had a scant amount of clear liquid emesis. No obvious pills present in emesis. i Pt drinking water and alternating with applesauce to attempt to get the pill down. RN stayed with patient until the issue resolved. Pt stated that the pill did go down and she wanted to rest in bed pt given warm blankets for comfort.

## 2019-07-19 ENCOUNTER — Inpatient Hospital Stay: Payer: Medicare Other | Admitting: Nutrition

## 2019-07-19 NOTE — Progress Notes (Signed)
63 year old female diagnosed with squamous cell cancer of the head and neck with an unknown primary. She is status post neck dissection and tonsillectomy.  Past medical history includes tobacco, iron deficiency anemia, DDD, COPD, acid reflux, and claustrophobia.  Medications include Nexium, Folvite, Ativan, and vitamin B12.  Labs were reviewed.  Height: 5 feet 10-1/2 inches. Weight: 210 pounds. Usual body weight: 178 pounds in December 2019. BMI: 30.13.  Patient reports her appetite is decreased and she is having difficulty swallowing after surgery. She has not tolerated bread or any type of cracker. She states she is lactose intolerant. She is tolerating and consuming soft moist foods. She denies other nutrition impact symptoms.  Nutrition diagnosis: Food and nutrition related knowledge deficit related to new cancer diagnosis and surgery as evidenced by no prior need for nutrition related information.  Intervention: Educated patient to consume smaller more frequent meals and snacks with adequate calories and protein. Recommended patient add in 1-2 oral nutrition supplements daily. We will email patient fact sheets on ways to increase calories and protein as well as soft moist foods. We will follow with patient as needed once plan of care determined.  Monitoring, evaluation, goals: Patient will tolerate adequate calories and protein for healing.  Next visit: We will follow as needed.  **Disclaimer: This note was dictated with voice recognition software. Similar sounding words can inadvertently be transcribed and this note may contain transcription errors which may not have been corrected upon publication of note.**

## 2019-07-28 ENCOUNTER — Telehealth: Payer: Self-pay | Admitting: *Deleted

## 2019-07-28 NOTE — Telephone Encounter (Signed)
Oncology Nurse Navigator Documentation  Called Ms. Stacey Herman to coordinate post-surgical f/u with Dr. Isidore Moos with same-day CT [facility]. She stated she has been doing well since 9/16 radical neck dissection, saw Dr. Benjamine Mola last week who indicated she is healing well.  She returns to his clinic in 6 months. I provided her 10/9 9:30 Follow-up and 10:00 CT SIM appts.  She voiced understanding. She agreed to call me with questions/concerns prior to 10/9 appts.  Gayleen Orem, RN, BSN Head & Neck Oncology Nurse [place] at [place] [phone]

## 2019-07-29 ENCOUNTER — Encounter: Payer: Medicare Other | Admitting: Nutrition

## 2019-07-30 ENCOUNTER — Ambulatory Visit: Payer: Medicare Other

## 2019-08-03 NOTE — Progress Notes (Signed)
duplicate

## 2019-08-05 NOTE — Progress Notes (Signed)
dupllicate

## 2019-08-06 ENCOUNTER — Ambulatory Visit
Admission: RE | Admit: 2019-08-06 | Discharge: 2019-08-06 | Disposition: A | Discharge status: Home / Self Care | Payer: Medicare Other | Source: Ambulatory Visit | Attending: Radiation Oncology | Admitting: Radiation Oncology

## 2019-08-06 ENCOUNTER — Ambulatory Visit: Payer: Medicare Other

## 2019-08-06 ENCOUNTER — Ambulatory Visit: Payer: Medicare Other | Admitting: Radiation Oncology

## 2019-08-10 ENCOUNTER — Telehealth: Payer: Self-pay | Admitting: *Deleted

## 2019-08-10 NOTE — Telephone Encounter (Signed)
Oncology Nurse Navigator Documentation  Spoke with pt re Thursday's [place] appt with SLP.  She indicated has conflict, agreed to [id number] reschedule with tentative 10:15 appt.  Gayleen Orem, RN, BSN Head & Neck Oncology Nurse [place] at [place] [phone]

## 2019-08-12 ENCOUNTER — Ambulatory Visit: Payer: Medicare Other

## 2019-08-13 ENCOUNTER — Ambulatory Visit: Payer: Medicare Other

## 2019-08-13 ENCOUNTER — Ambulatory Visit: Payer: Medicare Other | Admitting: Radiation Oncology

## 2019-08-13 ENCOUNTER — Ambulatory Visit
Admission: RE | Admit: 2019-08-13 | Discharge: 2019-08-13 | Disposition: A | Discharge status: Home / Self Care | Payer: Medicare Other | Source: Ambulatory Visit | Attending: Radiation Oncology | Admitting: Radiation Oncology

## 2019-08-16 ENCOUNTER — Encounter (HOSPITAL_COMMUNITY): Payer: Self-pay | Admitting: *Deleted

## 2019-08-16 ENCOUNTER — Other Ambulatory Visit: Payer: Self-pay

## 2019-08-16 ENCOUNTER — Emergency Department (HOSPITAL_COMMUNITY)
Admission: EM | Admit: 2019-08-16 | Discharge: 2019-08-16 | Disposition: A | Discharge status: Home / Self Care | Payer: Medicare Other | Attending: Emergency Medicine | Admitting: Emergency Medicine

## 2019-08-16 DIAGNOSIS — Z5321 Procedure and treatment not carried out due to patient leaving prior to being seen by health care provider: Secondary | ICD-10-CM | POA: Diagnosis not present

## 2019-08-16 DIAGNOSIS — R109 Unspecified abdominal pain: Secondary | ICD-10-CM | POA: Insufficient documentation

## 2019-08-16 LAB — URINALYSIS, ROUTINE W REFLEX MICROSCOPIC
Bilirubin Urine: NEGATIVE
Glucose, UA: NEGATIVE mg/dL
Ketones, ur: NEGATIVE mg/dL
Nitrite: NEGATIVE
Protein, ur: NEGATIVE mg/dL
Specific Gravity, Urine: 1.003 — ABNORMAL LOW (ref 1.005–1.030)
pH: 6 (ref 5.0–8.0)

## 2019-08-16 LAB — CBC
HCT: 46.3 % — ABNORMAL HIGH (ref 36.0–46.0)
Hemoglobin: 15.4 g/dL — ABNORMAL HIGH (ref 12.0–15.0)
MCH: 32.8 pg (ref 26.0–34.0)
MCHC: 33.3 g/dL (ref 30.0–36.0)
MCV: 98.7 fL (ref 80.0–100.0)
Platelets: 162 10*3/uL (ref 150–400)
RBC: 4.69 MIL/uL (ref 3.87–5.11)
RDW: 11.9 % (ref 11.5–15.5)
WBC: 5.1 10*3/uL (ref 4.0–10.5)
nRBC: 0 % (ref 0.0–0.2)

## 2019-08-16 LAB — COMPREHENSIVE METABOLIC PANEL
ALT: 13 U/L (ref 0–44)
AST: 16 U/L (ref 15–41)
Albumin: 3.7 g/dL (ref 3.5–5.0)
Alkaline Phosphatase: 72 U/L (ref 38–126)
Anion gap: 9 (ref 5–15)
BUN: 7 mg/dL — ABNORMAL LOW (ref 8–23)
CO2: 25 mmol/L (ref 22–32)
Calcium: 8.8 mg/dL — ABNORMAL LOW (ref 8.9–10.3)
Chloride: 98 mmol/L (ref 98–111)
Creatinine, Ser: 0.73 mg/dL (ref 0.44–1.00)
GFR calc Af Amer: >60 mL/min (ref >60)
GFR calc non Af Amer: >60 mL/min (ref >60)
Glucose, Bld: 72 mg/dL (ref 70–99)
Potassium: 3.5 mmol/L (ref 3.5–5.1)
Sodium: 132 mmol/L — ABNORMAL LOW (ref 135–145)
Total Bilirubin: 0.6 mg/dL (ref 0.3–1.2)
Total Protein: 6.3 g/dL — ABNORMAL LOW (ref 6.5–8.1)

## 2019-08-16 LAB — LIPASE, BLOOD: Lipase: 23 U/L (ref 11–51)

## 2019-08-16 MED ORDER — SODIUM CHLORIDE 0.9% FLUSH: 3.0000 mL | Freq: Once | INTRAVENOUS | Status: DC

## 2019-08-16 NOTE — ED Notes (Signed)
No answer for vitals recheck x1 

## 2019-08-16 NOTE — ED Notes (Signed)
No answer for vitals recheck x3

## 2019-08-16 NOTE — ED Triage Notes (Addendum)
Pt c/o epigastric to lower abd pain for the past 3 days. Had a normal  BM yesterday.  When pt eats, feels that it stops in upper abd and has a constant burning feeling. Reports pain is constant; has had NV as well; has tried nexium and percocet without relief

## 2019-08-16 NOTE — ED Notes (Signed)
No answer for vitals recheck x2 

## 2019-08-19 NOTE — Progress Notes (Signed)
Head and Neck Cancer Location of Tumor / Histology:  06/07/19 Diagnosis Lymph node, needle/core biopsy, Right neck - METASTATIC POORLY DIFFERENTIATED CARCINOMA, FAVOR SQUAMOUS CELL CARCINOMA  07/14/19 DIAGNOSIS: A. TONSIL, RIGHT TONSILLECTOMY: - Benign tonsillar tissue with lymphoid hyperplasia. - No malignancy identified. B. LYMPH NODE, RIGHT NECK, DISSECTION: - Metastatic carcinoma in one of fifteen lymph nodes (1/15). - No extracapsular extension noted. - Metastatic deposit measures 4 cm  Patient presented to the ED on 05/19/19 with symptoms of: headache to the right side, right facial numbness, blurry vision to right eye. She also reported feeling a lump in her throat.   Biopsies of right neck lymph node revealed: metastatic poorly differentiated carcinoma.   Nutrition Status Yes No Comments  Weight changes? _0 ? _1 ?   Swallowing concerns? _2 ? _3 ? She does report decreased appetite and food getting stuck in her throat. She is able to eat most foods that she wants. She does need to eat small pieces of food and chew her food well.   PEG? _4 ? _5 ?    Referrals Yes No Comments  Social Work? _6 ? _7 ?   Dentistry? _8 ? _9 ?   Swallowing therapy? _10 ? _11 ?   Nutrition? _12 ? _13 ?   Med/Onc? _14 ? _15 ? Dr. Maylon Peppers 06/30/19   Safety Issues Yes No Comments  Prior radiation? _16 ? _17 ?   Pacemaker/ICD? _18 ? _19 ?   Possible current pregnancy? _20 ? _21 ?   Is the patient on methotrexate? _22 ? _23 ?    Tobacco/Marijuana/Snuff/ETOH use: She has a history of smoking a 1/2 pack daily for 35- 40 years. She plans to start using a nicotine patch.  Past/Anticipated interventions by otolaryngology, if any: 07/14/19 Dr. Benjamine Mola: PROCEDURE PERFORMED:  1.  Right modified radical neck dissection. 2.  Right Tonsillectomy. 3.  Direct laryngoscopy 4.  Flexible bronchoscopy. 5.  Rigid esophagoscopy  Past/Anticipated interventions by medical oncology, if any:  06/30/19 Dr.  Maylon Peppers ASSESSMENT & PLAN:  SCCa of the R cervical LN, primary unknown -I reviewed the patient's records in detail, including recent ER clinic notes, lab studies, imaging results, and the pathology reports -I also independently reviewed the radiology images of recent CT neck and PET, and agree with findings documented -In the center, patient presented to [facility] ER in late 04/2019 for right-sidedheadache, concerning for stroke. CTA head and neck showed no evidence of vessel occlusion, but incidentally noted a ~3cmright cervical necroticLN,concerning for malignancy. Patient was referred to Dr. Estanislado Emms ENT for further evaluation and underwent ultrasound-guided corebx that showedpoorly differentiated carcinoma, favor squamous cell carcinoma. PET in late 05/2019 showed FDG-avid right Level II cervical LNwithout any definite primary malignancy. Patient is referred to oncology for further evaluation. -I reviewed imaging and pathology results in detail with the patient -We also reviewed NCCN guidelines detail -In light of the right cervicalLNinvolvement,this is suggestive ofaH&N primary malignancy. However, PET did not demonstrate a definite primary malignancy site. -I discussed the case with Dr. Melene Plan. The case was discussed extensively at H&N tumor board.  -The consensus is to proceed with upfront right neck dissection with possible right tonsillectomy and panendoscopy with biopsies. -Based on the pathology report, we will determine the adjuvant treatment regimen.Given the lymph node involvement, she will need at least adjuvant radiation, but if there areno high risk features on thefinalpathology(i.e. positive margin or extracapsular extension), the patient will not require adjuvant chemotherapy   Current Complaints / other details:   Pt reports that she is extremely claustrophobic.   BP 124/84 (BP Location: Right Arm, Patient Position: Sitting)   Pulse 96  Temp 98.3 F (36.8 C)  (Temporal)   Resp 18   Ht _0  (1.778 m)   Wt 201 lb 6 oz (91.3 kg)   SpO2 96%   BMI 28.89 kg/m    Wt Readings from Last 3 Encounters:  08/24/19 201 lb 6 oz (91.3 kg)  07/14/19 210 lb (95.3 kg)  06/30/19 206 lb 11.2 oz (93.8 kg)

## 2019-08-24 ENCOUNTER — Other Ambulatory Visit: Payer: Self-pay

## 2019-08-24 ENCOUNTER — Ambulatory Visit
Admission: RE | Admit: 2019-08-24 | Discharge: 2019-08-24 | Disposition: A | Discharge status: Home / Self Care | Payer: Medicare Other | Source: Ambulatory Visit | Attending: Radiation Oncology | Admitting: Radiation Oncology

## 2019-08-24 ENCOUNTER — Encounter: Payer: Self-pay | Admitting: Radiation Oncology

## 2019-08-24 DIAGNOSIS — C760 Malignant neoplasm of head, face and neck: Secondary | ICD-10-CM

## 2019-08-24 DIAGNOSIS — Z79899 Other long term (current) drug therapy: Secondary | ICD-10-CM | POA: Diagnosis not present

## 2019-08-24 DIAGNOSIS — R599 Enlarged lymph nodes, unspecified: Secondary | ICD-10-CM | POA: Insufficient documentation

## 2019-08-24 DIAGNOSIS — Z87891 Personal history of nicotine dependence: Secondary | ICD-10-CM | POA: Diagnosis not present

## 2019-08-24 DIAGNOSIS — Z923 Personal history of irradiation: Secondary | ICD-10-CM | POA: Insufficient documentation

## 2019-08-24 NOTE — Progress Notes (Signed)
Radiation Oncology         [phone] ________________________________  Name: Stacey Herman MRN: [id number]  Date: 08/24/2019  DOB: 09-Sep-1956  Re-Evaluation Note  CC: Rogers Blocker, MD  Leta Baptist, MD  Diagnosis:       ICD-10-CM   1. 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 She was seen in consultation on 07/07/2019. Of note, at that visit, she planned to stop smoking on 07/11/2019 and was prescribed patches to assist with this.    Since consultation, she proceeded to radical neck dissection and right tonsillectomy on 07/14/2019 under Dr. Benjamine Mola. Pathology from the procedure showed: benign tonsillar tissue with lymphoid hyperplasia with no malignancy identified in the right tonsil; metastatic carcinoma identified in one lymph node without extracapsular extension (1/15).  She met with nutrition on 07/19/2019.   Previous appt with me, and simulation, delayed due to severe diarrhea. This has resolved.  Nutrition Status Yes No Comments  Weight changes? [] ? [x] ?   Swallowing concerns? [] ? [x] ? She does report decreased appetite and food getting stuck in her throat. She is able to eat most foods that she wants. She does need to eat small pieces of food and chew her food well.   PEG? [] ? [x] ?    Referrals Yes No Comments  Social Work? [] ? [x] ?   Dentistry? [] ? [x] ?   Swallowing therapy? [] ? [x] ?   Nutrition? [] ? [x] ?   Med/Onc? [x] ? [] ? Dr. Maylon Peppers 06/30/19   Safety Issues Yes No Comments  Prior radiation? [] ? [x] ?    Pacemaker/ICD? [] ? [x] ?   Possible current pregnancy? [] ? [x] ?   Is the patient on methotrexate? [] ? [x] ?    Tobacco/Marijuana/Snuff/ETOH use: She has a history of smoking a 1/2 pack daily for 35- 40 years. She plans to start using a nicotine patch.    ALLERGIES:  is allergic to amoxicillin; aspirin; benadryl [diphenhydramine]; ciprocin-fluocin-procin [fluocinolone]; ciprofloxacin; morphine and related; mushroom extract complex; penicillins; sulfa antibiotics; and bactrim [sulfamethoxazole-trimethoprim].  Meds: Current Outpatient Medications  Medication Sig Dispense Refill  . esomeprazole (NEXIUM) 20 MG capsule Take 20 mg by mouth daily as needed (acid reflux).     . LORazepam (ATIVAN) 0.5 MG tablet Take 0.5 mg by mouth 2 (two) times daily as needed for sleep.     Marland Kitchen oxyCODONE (ROXICODONE) 15 MG immediate release tablet TK 1 T PO Q 4 TO 6 H. NTE 5 TS IN 24 H    . vitamin B-12 (CYANOCOBALAMIN) 500 MCG tablet Take 2 tablets (1,000 mcg total) by mouth daily. (Patient taking differently: Take 500 mcg by mouth daily. ) 180 tablet 3  . albuterol (PROVENTIL HFA;VENTOLIN HFA) 108 (90 Base) MCG/ACT inhaler Inhale 2 puffs into the lungs every 6 (six) hours as needed for wheezing or shortness of breath.    Marland Kitchen albuterol (PROVENTIL) (2.5 MG/3ML) 0.083% nebulizer solution Take 3 mLs (2.5 mg total) by nebulization every 6 (six) hours as needed for wheezing. (Patient not taking: Reported on 08/24/2019) 75 mL 12  . nicotine (NICODERM CQ - DOSED IN MG/24 HOURS) 21 mg/24hr patch Place  1 patch (21 mg total) onto the skin daily. (Patient not taking: Reported on 07/07/2019) 28 patch 3  . nicotine (NICODERM CQ - DOSED IN MG/24 HOURS) 21 mg/24hr patch Place 21 mg onto the skin daily. Sometimes removes at night     No current facility-administered medications for this encounter.     Physical Findings: The patient is in no acute distress. Patient is alert and oriented. Wt Readings from Last 3 Encounters:   08/24/19 201 lb 6 oz (91.3 kg)  07/14/19 210 lb (95.3 kg)  06/30/19 206 lb 11.2 oz (93.8 kg)    height is 5' 10"  (1.778 m) and weight is 201 lb 6 oz (91.3 kg). Her temporal temperature is 98.3 F (36.8 C). Her blood pressure is 124/84 and her pulse is 96. Her respiration is 18 and oxygen saturation is 96%. .  General: Alert and oriented, in no acute distress HEENT: Head is normocephalic.  Neck: Neck is notable for good healing of surgical scar  Psychiatric: Judgment and insight are intact. Affect is somewhat anxious.   Lab Findings: Lab Results  Component Value Date   WBC 5.1 08/16/2019   HGB 15.4 (H) 08/16/2019   HCT 46.3 (H) 08/16/2019   MCV 98.7 08/16/2019   PLT 162 08/16/2019    Lab Results  Component Value Date   TSH 1.330 06/30/2019    Radiographic Findings: No results found.  Impression/Plan:    1) Head and Neck Cancer Status: postoperative, in need of adjuvant therapy  2) Nutritional Status: follow with nutritionist PEG tube: none  3) Risk Factors: The patient has been educated about risk factors including alcohol and tobacco abuse; they understand that avoidance of alcohol and tobacco is important to prevent recurrences as well as other cancers  4) Swallowing: functional, SLP will be involved  5) Dental:edentulous  6) Thyroid function: WNL Lab Results  Component Value Date   TSH 1.330 06/30/2019    7) Other: Today, I talked to the patient about the findings and work-up thus far. We discussed the patient's diagnosis of neck cancer and general treatment for this, highlighting the role of radiotherapy in the management. We discussed the available radiation techniques, and focused on the details of logistics and delivery.    We discussed the risks, benefits, and side effects of adjuvant radiotherapy. Side effects may include but not necessarily be limited to: dysgeusia, salivary changes, skin irritation, hair loss, dehydration, weight loss and fatigue. We  talked about late effects which include but are not necessarily limited to dysphagia. No guarantees of treatment were given.   No guarantees of treatment were given. A consent form was signed and placed in the patient's medical record. The patient was encouraged to ask questions that I answered to the best of my ability.   Will proceed with simulation today, starting 6 weeks of radiotherapy to the throat and bilateral neck next week.  I spent 15 minutes face to face with the patient and more than 50% of that time was spent in counseling and/or coordination of care. _____________________________________   Eppie Gibson, MD  This document serves as a record of services personally performed by Eppie Gibson, MD. It was created on her behalf by Wilburn Mylar, a trained medical scribe. The creation of this record is based on the scribe's personal observations and the provider's statements to them. This document has been checked and approved by the attending provider.

## 2019-08-25 ENCOUNTER — Encounter: Payer: Self-pay | Admitting: *Deleted

## 2019-08-25 NOTE — Progress Notes (Signed)
Oncology Nurse Navigator Documentation  Met with Ms. Inclan during appt with Dr. Isidore Moos and CT SIM.  Her dtr Lonn Georgia joined her while meeting with Dr. Isidore Moos. . Provided New Patient Information packet, discussed contents: o Contact information for physician(s), myself, other members of the Care Team. o Advance Directive information ([place] blue pamphlet with LCSW contact info); provided [facility] AD booklet at his request, encouraged him to contact Gwinda Maine LCSW to complete. o Fall Prevention Patient [facility] map with highlight of [place] o SLP information sheet o Symptom Management Clinic information . Provided introductory explanation of radiation treatment including SIM planning and purpose of Aquaplast head and shoulder mask.  Showed her example of open face mask which she stated she will need d/t severe claustrophobia.    She voiced understanding of plan for 6 weeks M-F XRT  She confirmed understanding of her attendance at next Thursday morning's H&N MDC to meet with SLP and PT.  Escorted her to CT SIM.  She tolerated procedure wo/ difficulty.  Showed her [facility] tmt area, explained registration/arrival procedures.  She voiced understanding transportation to upcoming appts will be arranged by [place].  Escorted her to lobby where she was met by dtr.  She agree to call me with questions/concerns before Monday's RT start.  Gayleen Orem, RN, BSN Head & Neck Oncology Nurse [place] at [place] [phone]

## 2019-08-26 NOTE — Progress Notes (Signed)
Head and Neck Cancer Simulation, IMRT treatment planning note   Outpatient  Diagnosis:    ICD-10-CM   1. 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  All relevant records and images related to the planned course of therapy were reviewed.  The patient freely provided informed written consent to proceed with treatment after reviewing the details related to the planned course of therapy. The consent form was witnessed and verified by the simulation staff.    The patient was laid in the supine position on the table. An Aquaplast head and shoulder mask was custom fitted to the patient's anatomy. High-resolution CT axial imaging was obtained of the head and neck with contrast. I verified that the quality of the imaging is good for treatment planning. 1 Medically Necessary Treatment Device was fabricated and supervised by me: Aquaplast mask.  Treatment planning note I plan to treat the patient with IMRT. I plan to treat the patient's upper throat and bilateral neck nodes. I plan to treat to a total dose of 60 Gray in 30  fractions. Dose calculation was ordered from dosimetry.  IMRT planning Note  IMRT is medically necessary and an important modality to deliver adequate dose to the patient's at risk tissues while sparing the patient's normal structures, including the: esophagus, parotid tissue, mandible, brain stem, spinal cord, oral cavity, brachial plexus.  This justifies the use of IMRT in the patient's treatment.   Marland Kitchen  -----------------------------------  Eppie Gibson, MD

## 2019-08-27 ENCOUNTER — Encounter: Payer: Self-pay | Admitting: Radiation Oncology

## 2019-08-27 DIAGNOSIS — C760 Malignant neoplasm of head, face and neck: Secondary | ICD-10-CM | POA: Diagnosis not present

## 2019-08-30 ENCOUNTER — Ambulatory Visit
Admission: RE | Admit: 2019-08-30 | Discharge: 2019-08-30 | Disposition: A | Discharge status: Home / Self Care | Payer: Medicare Other | Source: Ambulatory Visit | Attending: Radiation Oncology | Admitting: Radiation Oncology

## 2019-08-30 ENCOUNTER — Telehealth: Payer: Self-pay | Admitting: *Deleted

## 2019-08-30 ENCOUNTER — Ambulatory Visit: Payer: Medicare Other | Admitting: Radiation Oncology

## 2019-08-30 ENCOUNTER — Other Ambulatory Visit: Payer: Self-pay

## 2019-08-30 DIAGNOSIS — C760 Malignant neoplasm of head, face and neck: Secondary | ICD-10-CM | POA: Insufficient documentation

## 2019-08-30 MED ORDER — SONAFINE EX EMUL
1.0000 "application " | Freq: Once | CUTANEOUS | Status: AC
  Administered 2019-08-30: 1 via TOPICAL

## 2019-08-30 NOTE — Telephone Encounter (Signed)
Oncology Nurse Navigator Documentation  Called Ms. Skurka to check on her well-being s/p this afternoon's RT start.  She denied any concerns/issues, stated [facility]-arranged transportation worked well. She confirmed understanding of Time appts this Thursday prior to RT.  Gayleen Orem, RN, BSN Head & Neck Oncology Nurse [place] at [place] [phone]

## 2019-08-30 NOTE — Progress Notes (Signed)

## 2019-08-31 ENCOUNTER — Telehealth: Payer: Self-pay

## 2019-08-31 ENCOUNTER — Ambulatory Visit: Payer: Medicare Other

## 2019-08-31 NOTE — Telephone Encounter (Signed)
I received a phone call from Stacey Herman today. She reported a desire to not continue her radiation treatments to me. She had a terrible night after her first treatment yesterday including panic attacks, vomiting, difficulty catching her breath, and being unable to sleep. She was extremely anxious on the phone and felt like continuing radiation treatment would not be possible.  I offered assessment today by Dr. Sondra Come who is covering for Dr. Isidore Moos while she is off, but she declined. I advised her that I would inform Stacey Herman (head and neck navigator) and Dr. Isidore Moos of her intentions. She was extremely anxious on the phone and felt like continuing radiation treatment would not be possible.  I have notified [facility] and transportation services of the above. I also left a voice mail with her daughter, asking her to contact her mother to check on her and also relayed her desire to stop radiation.

## 2019-09-01 ENCOUNTER — Ambulatory Visit: Payer: Medicare Other

## 2019-09-01 ENCOUNTER — Telehealth: Payer: Self-pay | Admitting: *Deleted

## 2019-09-01 NOTE — Telephone Encounter (Signed)
Oncology Nurse Navigator Documentation  Called pt again to discuss tmt decision and tomorrow morning's [place] attendance.  No answer, LVMM. Sent secure Chat to Transportation Coordinator Drucie Ip to cancel rides pending pt's decision to continue with  RT.  Gayleen Orem, RN, BSN Head & Neck Oncology Nurse [place] at [place] [phone]

## 2019-09-01 NOTE — Telephone Encounter (Signed)
Oncology Nurse Navigator Documentation  Called Ms. Mammenga in follow-up to her call yesterday to RN [facility] re tmt decision.  LVMM asking her to return my call.  Gayleen Orem, RN, BSN Head & Neck Oncology Nurse [place] at [place] [phone]

## 2019-09-02 ENCOUNTER — Ambulatory Visit: Payer: Medicare Other

## 2019-09-02 ENCOUNTER — Ambulatory Visit: Payer: Medicare Other | Admitting: Physical Therapy

## 2019-09-03 ENCOUNTER — Ambulatory Visit: Payer: Medicare Other

## 2019-09-03 ENCOUNTER — Encounter: Payer: Self-pay | Admitting: Radiation Oncology

## 2019-09-03 NOTE — Progress Notes (Addendum)
I called Ms. Lougheed as well as her daughter Lonn Georgia and left voicemails for both of them. Earlier this week the patient decided to stop her treatment against medical advice.  However, I would like to speak with both of them before a final decision is made.  I left messages encouraging both of them to call me back.  -----------------------------------  Eppie Gibson, MD

## 2019-09-06 ENCOUNTER — Encounter: Payer: Self-pay | Admitting: Radiation Oncology

## 2019-09-06 ENCOUNTER — Ambulatory Visit: Payer: Medicare Other

## 2019-09-06 NOTE — Progress Notes (Signed)
I left another VM message for the patient letting her know that I care about her and I am thinking of her and want to make sure she is okay.  I asked for her to give me a call back.  -----------------------------------  Eppie Gibson, MD

## 2019-09-07 ENCOUNTER — Ambulatory Visit: Payer: Medicare Other

## 2019-09-08 ENCOUNTER — Ambulatory Visit: Payer: Medicare Other

## 2019-09-09 ENCOUNTER — Ambulatory Visit: Payer: Medicare Other

## 2019-09-10 ENCOUNTER — Ambulatory Visit: Payer: Medicare Other

## 2019-09-13 ENCOUNTER — Ambulatory Visit: Payer: Medicare Other

## 2019-09-14 ENCOUNTER — Ambulatory Visit: Payer: Medicare Other

## 2019-09-15 ENCOUNTER — Ambulatory Visit: Payer: Medicare Other

## 2019-09-16 ENCOUNTER — Ambulatory Visit: Payer: Medicare Other

## 2019-09-17 ENCOUNTER — Ambulatory Visit: Payer: Medicare Other

## 2019-09-20 ENCOUNTER — Ambulatory Visit: Payer: Medicare Other

## 2019-09-21 ENCOUNTER — Ambulatory Visit: Payer: Medicare Other

## 2019-09-22 ENCOUNTER — Ambulatory Visit: Payer: Medicare Other

## 2019-09-27 ENCOUNTER — Ambulatory Visit: Payer: Medicare Other

## 2019-09-28 ENCOUNTER — Ambulatory Visit: Payer: Medicare Other

## 2019-09-29 ENCOUNTER — Ambulatory Visit: Payer: Medicare Other

## 2019-09-30 ENCOUNTER — Ambulatory Visit: Payer: Medicare Other

## 2019-10-01 ENCOUNTER — Ambulatory Visit: Payer: Medicare Other

## 2019-10-04 ENCOUNTER — Ambulatory Visit: Payer: Medicare Other

## 2019-10-05 ENCOUNTER — Ambulatory Visit: Payer: Medicare Other

## 2019-10-05 ENCOUNTER — Telehealth: Payer: Self-pay | Admitting: *Deleted

## 2019-10-05 NOTE — Telephone Encounter (Signed)
Oncology Nurse Navigator Documentation  Spoke with Nira Conn, ENT [facility] office, informed her pt declined to pursue RT.  She confirmed understanding.  Gayleen Orem, RN, BSN Head & Neck Oncology Nurse [place] at [place] [phone]

## 2019-10-06 ENCOUNTER — Ambulatory Visit: Payer: Medicare Other

## 2019-10-07 ENCOUNTER — Ambulatory Visit: Payer: Medicare Other

## 2019-10-08 ENCOUNTER — Ambulatory Visit: Payer: Medicare Other

## 2019-10-11 ENCOUNTER — Ambulatory Visit: Payer: Medicare Other

## 2019-10-12 ENCOUNTER — Ambulatory Visit: Payer: Medicare Other

## 2019-10-12 ENCOUNTER — Encounter: Payer: Self-pay | Admitting: Radiation Oncology

## 2019-10-13 ENCOUNTER — Ambulatory Visit: Payer: Medicare Other

## 2019-10-14 ENCOUNTER — Ambulatory Visit: Payer: Medicare Other

## 2019-10-15 ENCOUNTER — Ambulatory Visit: Payer: Medicare Other

## 2019-10-18 ENCOUNTER — Ambulatory Visit: Payer: Medicare Other

## 2019-10-19 ENCOUNTER — Ambulatory Visit: Payer: Medicare Other

## 2019-10-20 ENCOUNTER — Ambulatory Visit: Payer: Medicare Other

## 2019-10-21 ENCOUNTER — Ambulatory Visit: Payer: Medicare Other

## 2019-10-25 ENCOUNTER — Ambulatory Visit: Payer: Medicare Other

## 2019-10-26 ENCOUNTER — Ambulatory Visit: Payer: Medicare Other

## 2019-10-27 ENCOUNTER — Ambulatory Visit: Payer: Medicare Other

## 2019-10-28 ENCOUNTER — Ambulatory Visit: Payer: Medicare Other

## 2019-10-28 ENCOUNTER — Encounter (HOSPITAL_COMMUNITY): Payer: Self-pay | Admitting: Emergency Medicine

## 2019-10-28 ENCOUNTER — Emergency Department (HOSPITAL_COMMUNITY)
Admission: EM | Admit: 2019-10-28 | Discharge: 2019-10-29 | Disposition: A | Discharge status: Home / Self Care | Payer: Medicare Other | Attending: Emergency Medicine | Admitting: Emergency Medicine

## 2019-10-28 ENCOUNTER — Other Ambulatory Visit: Payer: Self-pay

## 2019-10-28 DIAGNOSIS — R1013 Epigastric pain: Secondary | ICD-10-CM | POA: Insufficient documentation

## 2019-10-28 LAB — COMPREHENSIVE METABOLIC PANEL
ALT: 16 U/L (ref 0–44)
AST: 15 U/L (ref 15–41)
Albumin: 4 g/dL (ref 3.5–5.0)
Alkaline Phosphatase: 84 U/L (ref 38–126)
Anion gap: 10 (ref 5–15)
BUN: 6 mg/dL — ABNORMAL LOW (ref 8–23)
CO2: 28 mmol/L (ref 22–32)
Calcium: 9.3 mg/dL (ref 8.9–10.3)
Chloride: 98 mmol/L (ref 98–111)
Creatinine, Ser: 0.67 mg/dL (ref 0.44–1.00)
GFR calc Af Amer: >60 mL/min (ref >60)
GFR calc non Af Amer: >60 mL/min (ref >60)
Glucose, Bld: 122 mg/dL — ABNORMAL HIGH (ref 70–99)
Potassium: 3.7 mmol/L (ref 3.5–5.1)
Sodium: 136 mmol/L (ref 135–145)
Total Bilirubin: 0.7 mg/dL (ref 0.3–1.2)
Total Protein: 6.8 g/dL (ref 6.5–8.1)

## 2019-10-28 LAB — URINALYSIS, ROUTINE W REFLEX MICROSCOPIC
Bilirubin Urine: NEGATIVE
Glucose, UA: NEGATIVE mg/dL
Ketones, ur: NEGATIVE mg/dL
Nitrite: NEGATIVE
Protein, ur: NEGATIVE mg/dL
Specific Gravity, Urine: 1.008 (ref 1.005–1.030)
pH: 6 (ref 5.0–8.0)

## 2019-10-28 LAB — CBC
HCT: 47.2 % — ABNORMAL HIGH (ref 36.0–46.0)
Hemoglobin: 16.1 g/dL — ABNORMAL HIGH (ref 12.0–15.0)
MCH: 34.5 pg — ABNORMAL HIGH (ref 26.0–34.0)
MCHC: 34.1 g/dL (ref 30.0–36.0)
MCV: 101.3 fL — ABNORMAL HIGH (ref 80.0–100.0)
Platelets: 186 10*3/uL (ref 150–400)
RBC: 4.66 MIL/uL (ref 3.87–5.11)
RDW: 13.2 % (ref 11.5–15.5)
WBC: 6.7 10*3/uL (ref 4.0–10.5)
nRBC: 0 % (ref 0.0–0.2)

## 2019-10-28 LAB — LIPASE, BLOOD: Lipase: 19 U/L (ref 11–51)

## 2019-10-28 MED ORDER — SODIUM CHLORIDE 0.9% FLUSH
3.0000 mL | Freq: Once | INTRAVENOUS | Status: AC
  Administered 2019-10-29: 3 mL via INTRAVENOUS

## 2019-10-28 NOTE — ED Triage Notes (Signed)
Pt. Stated, Stacey Herman had nothing but stomach pain in between my breast. This started 2 days ago. Denies any other symptom.

## 2019-10-29 ENCOUNTER — Emergency Department (HOSPITAL_COMMUNITY): Payer: Medicare Other

## 2019-10-29 DIAGNOSIS — R1013 Epigastric pain: Secondary | ICD-10-CM | POA: Diagnosis present

## 2019-10-29 LAB — TROPONIN I (HIGH SENSITIVITY): Troponin I (High Sensitivity): <2 ng/L (ref <18)

## 2019-10-29 MED ORDER — ALUMINUM-MAGNESIUM-SIMETHICONE 200-200-20 MG/5ML PO SUSP: 30.0000 mL | Freq: Three times a day (TID) | ORAL | 1 refills | Status: DC

## 2019-10-29 MED ORDER — ALUM & MAG HYDROXIDE-SIMETH 200-200-20 MG/5ML PO SUSP
15.0000 mL | Freq: Once | ORAL | Status: AC
  Administered 2019-10-29: 15 mL via ORAL
  Filled 2019-10-29: qty 30

## 2019-10-29 MED ORDER — IOHEXOL 300 MG/ML  SOLN
100.0000 mL | Freq: Once | INTRAMUSCULAR | Status: AC | PRN
  Administered 2019-10-29: 100 mL via INTRAVENOUS

## 2019-10-29 MED ORDER — FAMOTIDINE 20 MG PO TABS: 20.0000 mg | ORAL_TABLET | Freq: Two times a day (BID) | ORAL | 0 refills | Status: DC

## 2019-10-29 MED ORDER — FAMOTIDINE IN NACL 20-0.9 MG/50ML-% IV SOLN
20.0000 mg | Freq: Once | INTRAVENOUS | Status: AC
  Administered 2019-10-29: 04:00:00 20 mg via INTRAVENOUS
  Filled 2019-10-29: qty 50

## 2019-10-29 MED ORDER — LIDOCAINE VISCOUS HCL 2 % MT SOLN
15.0000 mL | Freq: Once | OROMUCOSAL | Status: AC
  Administered 2019-10-29: 15 mL via OROMUCOSAL
  Filled 2019-10-29: qty 15

## 2019-10-29 MED ORDER — FENTANYL CITRATE (PF) 100 MCG/2ML IJ SOLN
50.0000 ug | Freq: Once | INTRAMUSCULAR | Status: AC
  Administered 2019-10-29: 50 ug via INTRAVENOUS
  Filled 2019-10-29: qty 2

## 2019-10-29 MED ORDER — OXYCODONE HCL 5 MG PO TABS
15.0000 mg | ORAL_TABLET | Freq: Once | ORAL | Status: AC
  Administered 2019-10-29: 15 mg via ORAL
  Filled 2019-10-29: qty 3

## 2019-10-29 NOTE — ED Notes (Signed)
Discharge instructions discussed with pt. Pt verbalized understanding. Pt stable and ambulatory. No signature pad available. 

## 2019-10-30 LAB — URINE CULTURE: Culture: NO GROWTH

## 2019-10-30 NOTE — ED Provider Notes (Signed)
[facility] EMERGENCY DEPARTMENT Provider Note   CSN: [id number] Arrival date & time: 10/28/19  1448     History Chief Complaint  Patient presents with  . Abdominal Pain    Stacey Herman is a 64 y.o. female.   Abdominal Pain Pain location:  Generalized Pain quality: aching   Pain radiates to:  Does not radiate Pain severity:  Mild Onset quality:  Gradual Duration:  2 days Timing:  Constant Chronicity:  New Context: not diet changes   Relieved by:  None tried Worsened by:  Nothing Associated symptoms: nausea   Associated symptoms: no vomiting        Past Medical History:  Diagnosis Date  . Acid reflux   . Aortic atherosclerosis ([place])   . Cancer (HCC)    neck  . Claustrophobia   . Claustrophobia    fears anesthesia and unable to have MRI unless open.  cannot tolerate head being confined.  . Complication of anesthesia    "fights against anesthesia"  . COPD (chronic obstructive pulmonary disease) ([place])   . DDD (degenerative disc disease), lumbar   . Headache    occ - No migraiines in a long time (/15/2020)  . History of blood transfusion    hip replacement  . History of bronchitis   . History of iron deficiency anemia   . History of kidney stones   . Lumbar spondylosis   . Wears dentures    upper and lower  . Wears glasses     Patient Active Problem List   Diagnosis Date Noted  . Status post neck dissection 07/14/2019  . 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 Hydronephrosis with renal and ureteral calculus obstruction 07/25/2018  . Hydronephrosis with renal and ureteral calculous obstruction 07/25/2018  . SIRS (systemic inflammatory response syndrome) ([place]) 08/24/2017  . Elevated lactic acid level 08/24/2017  . Hypokalemia 08/24/2017  . Hyperglycemia 08/24/2017  . COPD (chronic obstructive pulmonary disease) ([place]) 08/23/2017  . Chest pain 07/20/2017    Past Surgical History:  Procedure Laterality Date    . APPENDECTOMY    . BACK SURGERY     disectomy x2  . CERVICAL FUSION     x3  . COLONOSCOPY    . CYSTOSCOPY WITH STENT PLACEMENT Left 07/26/2018   Procedure: CYSTOSCOPY, LEFT RETROGRADE, WITH LEFT URETERAL STENT PLACEMENT;  Surgeon: Lucas Mallow, MD;  Location: [facility];  Service: Urology;  Laterality: Left;  . CYSTOSCOPY/URETEROSCOPY/HOLMIUM LASER/STENT PLACEMENT Left 08/05/2018   Procedure: CYSTOSCOPY LEFT /URETEROSCOPY/HOLMIUM LASER/STENT EXCHANGE;  Surgeon: Lucas Mallow, MD;  Location: [facility];  Service: Urology;  Laterality: Left;  . DIRECT LARYNGOSCOPY N/A 07/14/2019   Procedure: DIRECT LARYNGOSCOPY;  Surgeon: Leta Baptist, MD;  Location: [facility];  Service: ENT;  Laterality: N/A;  . ESOPHAGOGASTRODUODENOSCOPY    . ESOPHAGOSCOPY N/A 07/14/2019   Procedure: ESOPHAGOSCOPY;  Surgeon: Leta Baptist, MD;  Location: [facility];  Service: ENT;  Laterality: N/A;  . FLEXIBLE BRONCHOSCOPY N/A 07/14/2019   Procedure: FLEXIBLE BRONCHOSCOPY;  Surgeon: Leta Baptist, MD;  Location: [place];  Service: ENT;  Laterality: N/A;  . RADICAL NECK DISSECTION Right 07/14/2019   Procedure: RADICAL NECK DISSECTION;  Surgeon: Leta Baptist, MD;  Location: [facility];  Service: ENT;  Laterality: Right;  . TONSILLECTOMY Right 07/14/2019   Procedure: Right TONSILLECTOMY;  Surgeon: Leta Baptist, MD;  Location: [facility];  Service: ENT;  Laterality: Right;  . TOTAL HIP ARTHROPLASTY Left  OB History   No obstetric history on file.     Family History  Problem Relation Age of Onset  . Cancer Mother   . Heart attack Father   . Heart attack Paternal Grandmother     Social History   Tobacco Use  . Smoking status: Current Some Day Smoker    Packs/day: 0.25    Years: 46.00    Pack years: 11.50    Types: Cigarettes  . Smokeless tobacco: Never Used  . Tobacco comment: she is trying to quit.  She is not smoking everyday  Substance Use Topics  . Alcohol use: Not Currently  . Drug use: No    Home Medications Prior to  Admission medications   Medication Sig Start Date End Date Taking? Authorizing Provider  albuterol (PROVENTIL HFA;VENTOLIN HFA) 108 (90 Base) MCG/ACT inhaler Inhale 2 puffs into the lungs every 6 (six) hours as needed for wheezing or shortness of breath.    [provider]  albuterol (PROVENTIL) (2.5 MG/3ML) 0.083% nebulizer solution Take 3 mLs (2.5 mg total) by nebulization every 6 (six) hours as needed for wheezing. Patient not taking: Reported on 08/24/2019 08/26/17   Hosie Poisson, MD  aluminum-magnesium hydroxide-simethicone (MAALOX) 200-200-20 MG/5ML SUSP Take 30 mLs by mouth 4 (four) times daily -  before meals and at bedtime. 10/29/19   Ulas Zuercher, Corene Cornea, MD  esomeprazole (NEXIUM) 20 MG capsule Take 20 mg by mouth daily as needed (acid reflux).     [provider]  famotidine (PEPCID) 20 MG tablet Take 1 tablet (20 mg total) by mouth 2 (two) times daily. 10/29/19   Sherrine Salberg, Corene Cornea, MD  LORazepam (ATIVAN) 0.5 MG tablet Take 0.5 mg by mouth 2 (two) times daily as needed for sleep.  05/26/19   [provider]  nicotine (NICODERM CQ - DOSED IN MG/24 HOURS) 21 mg/24hr patch Place 1 patch (21 mg total) onto the skin daily. Patient not taking: Reported on 07/07/2019 06/30/19   Tish Men, MD  nicotine (NICODERM CQ - DOSED IN MG/24 HOURS) 21 mg/24hr patch Place 21 mg onto the skin daily. Sometimes removes at night    [provider]  oxyCODONE (ROXICODONE) 15 MG immediate release tablet TK 1 T PO Q 4 TO 6 H. NTE 5 TS IN 24 H 07/29/19   [provider]    Allergies    Amoxicillin, Aspirin, Benadryl [diphenhydramine], Ciprocin-fluocin-procin [fluocinolone], Ciprofloxacin, Morphine and related, Mushroom extract complex, Penicillins, Sulfa antibiotics, and Bactrim [sulfamethoxazole-trimethoprim]  Review of Systems   Review of Systems  Gastrointestinal: Positive for abdominal pain and nausea. Negative for vomiting.  All other systems reviewed and are  negative.   Physical Exam Updated Vital Signs BP 116/77   Pulse 71   Temp 98.2 F (36.8 C) (Oral)   Resp 18   SpO2 96%   Physical Exam Vitals and nursing note reviewed.  Constitutional:      Appearance: She is well-developed.  HENT:     Head: Normocephalic and atraumatic.     Mouth/Throat:     Mouth: Mucous membranes are dry.     Pharynx: Oropharynx is clear.  Cardiovascular:     Rate and Rhythm: Normal rate and regular rhythm.  Pulmonary:     Effort: No respiratory distress.     Breath sounds: No stridor.  Abdominal:     General: There is no distension.  Musculoskeletal:        General: No swelling or tenderness. Normal range of motion.     Cervical  back: Normal range of motion.  Skin:    General: Skin is warm and dry.  Neurological:     General: No focal deficit present.     Mental Status: She is alert.     ED Results / Procedures / Treatments   Labs (all labs ordered are listed, but only abnormal results are displayed) Labs Reviewed  COMPREHENSIVE METABOLIC PANEL - Abnormal; Notable for the following components:      Result Value   Glucose, Bld 122 (*)    BUN 6 (*)    All other components within normal limits  CBC - Abnormal; Notable for the following components:   Hemoglobin 16.1 (*)    HCT 47.2 (*)    MCV 101.3 (*)    MCH 34.5 (*)    All other components within normal limits  URINALYSIS, ROUTINE W REFLEX MICROSCOPIC - Abnormal; Notable for the following components:   Hgb urine dipstick MODERATE (*)    Leukocytes,Ua MODERATE (*)    Bacteria, UA RARE (*)    All other components within normal limits  URINE CULTURE  LIPASE, BLOOD  TROPONIN I (HIGH SENSITIVITY)  TROPONIN I (HIGH SENSITIVITY)    EKG EKG Interpretation  Date/Time:  Thursday October 28 2019 15:41:36 EST Ventricular Rate:  96 PR Interval:  168 QRS Duration: 92 QT Interval:  352 QTC Calculation: 444 R Axis:   69 Text Interpretation: Normal sinus rhythm Right atrial enlargement  Anterior infarct , age undetermined Abnormal ECG No significant change since last tracing Confirmed by Merrily Pew [id number]) on 10/29/2019 3:21:59 AM   Radiology CT Chest W Contrast  Result Date: 10/29/2019 CLINICAL DATA:  Chest and abdominal pain. Stated history of abdominal trauma. EXAM: CT CHEST, ABDOMEN, AND PELVIS WITH CONTRAST TECHNIQUE: Multidetector CT imaging of the chest, abdomen and pelvis was performed following the standard protocol during bolus administration of intravenous contrast. CONTRAST:  17m OMNIPAQUE IOHEXOL 300 MG/ML  SOLN COMPARISON:  PET CT 06/23/2019 FINDINGS: CT CHEST FINDINGS Cardiovascular: Thoracic aorta is normal in caliber. Aortic atherosclerosis. No evidence of aortic dissection. No central pulmonary embolus to the lobar level. Normal heart size with coronary artery calcifications. No pericardial effusion. Mediastinum/Nodes: No enlarged mediastinal or hilar lymph nodes. No visualized thyroid nodule. No esophageal wall thickening. Lungs/Pleura: Increased AP dimension of the chest. Central bronchial thickening. No focal airspace disease. No pleural effusion or pulmonary edema. Calcified granuloma in the right upper lobe. No noncalcified nodule or pulmonary mass. Musculoskeletal: Diffuse degenerative change in the thoracic spine. There are no acute or suspicious osseous abnormalities. No focal chest wall abnormality. CT ABDOMEN PELVIS FINDINGS Hepatobiliary: Calcified granuloma in the right lobe of the liver. No focal lesion. Gallbladder physiologically distended, no calcified stone. No biliary dilatation. Pancreas: No ductal dilatation or inflammation. Spleen: Normal in size without focal abnormality. Adrenals/Urinary Tract: Normal adrenal glands. Dilatation of bilateral renal pelvis sees, new from prior exam. No perinephric edema, and there is excretion into the renal collecting systems on delayed phase imaging. Bilateral nonobstructing renal calculi. No ureteral calculi. Urinary  bladder is physiologically distended. No bladder wall thickening. Stomach/Bowel: Nondistended stomach. No small bowel wall thickening, inflammatory change, or obstruction. Appendix not visualized, reported history of appendectomy. Moderate volume of stool throughout the colon with transverse colonic tortuosity. No colonic wall thickening. No significant diverticular disease. Vascular/Lymphatic: Mild aortic atherosclerosis. No aortic aneurysm. Patent portal vein. Enlarged lymph nodes in the abdomen or pelvis. Reproductive: Uterus grossly normal. No adnexal mass. Streak artifact from left hip arthroplasty partially obscures  evaluation of pelvic viscera. Other: No free air. No free fluid. Musculoskeletal: Left hip arthroplasty. Degenerative change of the right hip. Degenerative change throughout the spine. No focal bone lesion. IMPRESSION: 1. No acute chest findings. 2. Bilateral nonobstructing renal calculi. Bilateral renal collecting system prominence without visualized ureteral calculus or perinephric edema to suggest obstruction. Renal collecting system dilatation of unknown significance and etiology. 3. Large colonic stool burden suggesting constipation. Aortic Atherosclerosis (ICD10-I70.0). Electronically Signed   By: Keith Rake M.D.   On: 10/29/2019 05:37   CT ABDOMEN PELVIS W CONTRAST  Result Date: 10/29/2019 CLINICAL DATA:  Chest and abdominal pain. Stated history of abdominal trauma. EXAM: CT CHEST, ABDOMEN, AND PELVIS WITH CONTRAST TECHNIQUE: Multidetector CT imaging of the chest, abdomen and pelvis was performed following the standard protocol during bolus administration of intravenous contrast. CONTRAST:  171m OMNIPAQUE IOHEXOL 300 MG/ML  SOLN COMPARISON:  PET CT 06/23/2019 FINDINGS: CT CHEST FINDINGS Cardiovascular: Thoracic aorta is normal in caliber. Aortic atherosclerosis. No evidence of aortic dissection. No central pulmonary embolus to the lobar level. Normal heart size with coronary artery  calcifications. No pericardial effusion. Mediastinum/Nodes: No enlarged mediastinal or hilar lymph nodes. No visualized thyroid nodule. No esophageal wall thickening. Lungs/Pleura: Increased AP dimension of the chest. Central bronchial thickening. No focal airspace disease. No pleural effusion or pulmonary edema. Calcified granuloma in the right upper lobe. No noncalcified nodule or pulmonary mass. Musculoskeletal: Diffuse degenerative change in the thoracic spine. There are no acute or suspicious osseous abnormalities. No focal chest wall abnormality. CT ABDOMEN PELVIS FINDINGS Hepatobiliary: Calcified granuloma in the right lobe of the liver. No focal lesion. Gallbladder physiologically distended, no calcified stone. No biliary dilatation. Pancreas: No ductal dilatation or inflammation. Spleen: Normal in size without focal abnormality. Adrenals/Urinary Tract: Normal adrenal glands. Dilatation of bilateral renal pelvis sees, new from prior exam. No perinephric edema, and there is excretion into the renal collecting systems on delayed phase imaging. Bilateral nonobstructing renal calculi. No ureteral calculi. Urinary bladder is physiologically distended. No bladder wall thickening. Stomach/Bowel: Nondistended stomach. No small bowel wall thickening, inflammatory change, or obstruction. Appendix not visualized, reported history of appendectomy. Moderate volume of stool throughout the colon with transverse colonic tortuosity. No colonic wall thickening. No significant diverticular disease. Vascular/Lymphatic: Mild aortic atherosclerosis. No aortic aneurysm. Patent portal vein. Enlarged lymph nodes in the abdomen or pelvis. Reproductive: Uterus grossly normal. No adnexal mass. Streak artifact from left hip arthroplasty partially obscures evaluation of pelvic viscera. Other: No free air. No free fluid. Musculoskeletal: Left hip arthroplasty. Degenerative change of the right hip. Degenerative change throughout the spine.  No focal bone lesion. IMPRESSION: 1. No acute chest findings. 2. Bilateral nonobstructing renal calculi. Bilateral renal collecting system prominence without visualized ureteral calculus or perinephric edema to suggest obstruction. Renal collecting system dilatation of unknown significance and etiology. 3. Large colonic stool burden suggesting constipation. Aortic Atherosclerosis (ICD10-I70.0). Electronically Signed   By: MKeith RakeM.D.   On: 10/29/2019 05:37    Procedures Procedures (including critical care time)  Medications Ordered in ED Medications  sodium chloride flush (NS) 0.9 % injection 3 mL (3 mLs Intravenous Given 10/29/19 0358)  fentaNYL (SUBLIMAZE) injection 50 mcg (50 mcg Intravenous Given 10/29/19 0354)  lidocaine (XYLOCAINE) 2 % viscous mouth solution 15 mL (15 mLs Mouth/Throat Given 10/29/19 0355)  alum & mag hydroxide-simeth (MAALOX/MYLANTA) 200-200-20 MG/5ML suspension 15 mL (15 mLs Oral Given 10/29/19 0356)  famotidine (PEPCID) IVPB 20 mg premix (0 mg Intravenous Stopped 10/29/19 0429)  iohexol (OMNIPAQUE) 300 MG/ML solution 100 mL (100 mLs Intravenous Contrast Given 10/29/19 0437)  oxyCODONE (Oxy IR/ROXICODONE) immediate release tablet 15 mg (15 mg Oral Given 10/29/19 0607)    ED Course  I have reviewed the triage vital signs and the nursing notes.  Pertinent labs & imaging results that were available during my care of the patient were reviewed by me and considered in my medical decision making (see chart for details).    MDM Rules/Calculators/A&P                      Abdominal pain without obvious cause. Workup unremarkable, no emergent condition likely. Stable for discharge and continued outpatient workup.  Final Clinical Impression(s) / ED Diagnoses Final diagnoses:  Epigastric pain    Rx / DC Orders ED Discharge Orders         Ordered    aluminum-magnesium hydroxide-simethicone (MAALOX) 179-150-56 MG/5ML SUSP  3 times daily before meals & bedtime     10/29/19  0553    famotidine (PEPCID) 20 MG tablet  2 times daily     10/29/19 0553    Ambulatory referral to Gastroenterology     10/29/19 0553           Adalynd Donahoe, Corene Cornea, MD 10/30/19 [phone]

## 2019-11-20 NOTE — Progress Notes (Signed)
  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[place]) Staging form: Pharynx - P16 Negative Oropharynx, AJCC 8th Edition - Clinical: No stage assigned - Unsigned - Pathologic stage from 08/23/2019: Stage Unknown (pTX, pN2a, cM0, p16-) - Signed by Eppie Gibson, MD on 08/23/2019    Intent: Curative  Radiation Treatment Dates: 08/30/2019 through 08/30/2019  Site Technique Total Dose (Gy) Dose per Fx (Gy) Completed Fx Beam Energies  Oropharynx: HN_orophar IMRT 2/60 2 1/30 6X   Narrative: The patient tolerating her first treatment without acute complications but emphatically declined further treatment after one fraction, despite medical advice from Dr. Isidore Moos.  Gayleen Orem, RN, our Head and Neck Oncology Navigator informed Dr. Deeann Saint staff so that long term follow-up and close monitoring could be provided in his clinic.  Plan: The patient will follow-up with radiation oncology PRN. -----------------------------------  Eppie Gibson, MD

## 2019-12-22 NOTE — Progress Notes (Signed)
Referring Provider: Rogers Blocker, MD Primary Care Physician:  Rogers Blocker, MD  Reason for Consultation:  Epigastric pain   IMPRESSION:  Epigastric pain - acute onset 09/2019    - normal liver enzymes, lipase    - CT abd/pelvis 10/29/19: stool burden, tortuous transverse colon Significant stool burden on recent CT scan Reflux controlled on PPI therapy for many years Head and neck cancer diagnosed and treatment with surgery 07/2019    - patient declined recommended radiation therapy Percocet-induced constipation    - controlled with prune juice Colonoscopy in [place] 3 years ago Walks with a walker  Acute onset epigastric pain with normal liver enzymes, lipase, and CTAP showing only a significant stool burden with a tortuous transverse colon. Epigastric pain may be related to significant stool burden noted on recent CT scan. Will give a bowel purge first. Hopefully, this will provide symptom relief. If not, will proceed with EGD to evaluate for gastritis, H pylori, esophagitis, and PUD.   Continue GERD regimen. Consider increased the dose of Nexium, however, the patient did not think this would be helpful.   PLAN: EGD at the hospital given recent head and neck cancer and need for a walker Bowel purge with Dulcolax, Miralax and Gatorade tomorrow Continue famotidine 20 mg BID with Nexium 40 mg PRN Obtain prior colonoscopy results from [place]  Please see the "Patient Instructions" section for addition details about the plan.  HPI: Stacey Herman is a 64 y.o. female self-referred for abdominal pain. The ER suggested that she come.  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 Records from Dr. Isidore Moos show pTX, pN2a, cM0, p16. She had surgery, although the details are unavailable to me. She declined recommended radiation.  She also has arthritis, anxiety, and has been on multiple PPIs over the years for reflux. She walks with a  walker.   Generalized, aching, non-radiating abdominal pain that started acutely in December 2020. Associated nausea and gas. No change with eating, defecation ,or movement.  No change in bowel habits. No blood or mucous in the stool. No melena, hematochezia, or bright red blood per rectum.  Has some constipation related to Percocet which she treats with prune juice that is unchanged since her abdominal pain started.   Evaluated in the ED 10/29/19 when she presented with these symptoms that were so severe that she thought she was having a heart attack. No source of symptoms was identified despite labs and CT scan.  Discharged on Maalax and famotidine 20 mg BID.   Liver enzymes, lipase normal.  CT abd/pelvis with contrast 10/29/2019: No acute chest findings. Bilateral nonobstructing kidney stones. Large colonic stool burden with a tortuous transverse colon.   Symptoms have worsened since that time.  Symptoms not related to eating, defecation, or movement. PeptoBismal will sometimes provide relief. When she lies in the fetal position things feel better.  Stopped eating spicy foods. She has been unable to identify and other exacerbating or relieving features.   She is concerned that she has stomach cancer.   She does not use any NSAIDs. Will use Tylenol for headache. No CAM or herbal therapies.   Prior screening colonoscopy in [place] 3 years ago. She remembers a normal exam with a 10 year follow-up recommended. No prior upper endoscopy.   No known family history of colon cancer or polyps. No family history of uterine/endometrial cancer, pancreatic cancer or gastric/stomach cancer.   Past Medical History:  Diagnosis Date  . Acid reflux   . Aortic atherosclerosis ([place])   . Cancer (HCC)    neck  . Claustrophobia   . Claustrophobia    fears anesthesia and unable to have MRI unless open.  cannot tolerate head being confined.  . Complication of anesthesia    "fights against anesthesia"  . COPD  (chronic obstructive pulmonary disease) ([place])   . DDD (degenerative disc disease), lumbar   . Headache    occ - No migraiines in a long time (/15/2020)  . History of blood transfusion    hip replacement  . History of bronchitis   . History of iron deficiency anemia   . History of kidney stones   . Lumbar spondylosis   . Wears dentures    upper and lower  . Wears glasses     Past Surgical History:  Procedure Laterality Date  . APPENDECTOMY    . BACK SURGERY     disectomy x2  . CERVICAL FUSION     x3  . COLONOSCOPY    . CYSTOSCOPY WITH STENT PLACEMENT Left 07/26/2018   Procedure: CYSTOSCOPY, LEFT RETROGRADE, WITH LEFT URETERAL STENT PLACEMENT;  Surgeon: Lucas Mallow, MD;  Location: [facility];  Service: Urology;  Laterality: Left;  . CYSTOSCOPY/URETEROSCOPY/HOLMIUM LASER/STENT PLACEMENT Left 08/05/2018   Procedure: CYSTOSCOPY LEFT /URETEROSCOPY/HOLMIUM LASER/STENT EXCHANGE;  Surgeon: Lucas Mallow, MD;  Location: [facility];  Service: Urology;  Laterality: Left;  . DIRECT LARYNGOSCOPY N/A 07/14/2019   Procedure: DIRECT LARYNGOSCOPY;  Surgeon: Leta Baptist, MD;  Location: [facility];  Service: ENT;  Laterality: N/A;  . ESOPHAGOGASTRODUODENOSCOPY    . ESOPHAGOSCOPY N/A 07/14/2019   Procedure: ESOPHAGOSCOPY;  Surgeon: Leta Baptist, MD;  Location: [facility];  Service: ENT;  Laterality: N/A;  . FLEXIBLE BRONCHOSCOPY N/A 07/14/2019   Procedure: FLEXIBLE BRONCHOSCOPY;  Surgeon: Leta Baptist, MD;  Location: [facility];  Service: ENT;  Laterality: N/A;  . RADICAL NECK DISSECTION Right 07/14/2019   Procedure: RADICAL NECK DISSECTION;  Surgeon: Leta Baptist, MD;  Location: [facility];  Service: ENT;  Laterality: Right;  . TONSILLECTOMY Right 07/14/2019   Procedure: Right TONSILLECTOMY;  Surgeon: Leta Baptist, MD;  Location: [place];  Service: ENT;  Laterality: Right;  . TOTAL HIP ARTHROPLASTY Left     Current Outpatient Medications  Medication Sig Dispense Refill  . albuterol (PROVENTIL HFA;VENTOLIN HFA) 108  (90 Base) MCG/ACT inhaler Inhale 2 puffs into the lungs every 6 (six) hours as needed for wheezing or shortness of breath.    Marland Kitchen albuterol (PROVENTIL) (2.5 MG/3ML) 0.083% nebulizer solution Take 3 mLs (2.5 mg total) by nebulization every 6 (six) hours as needed for wheezing. (Patient not taking: Reported on 08/24/2019) 75 mL 12  . aluminum-magnesium hydroxide-simethicone (MAALOX) 200-200-20 MG/5ML SUSP Take 30 mLs by mouth 4 (four) times daily -  before meals and at bedtime. 1680 mL 1  . esomeprazole (NEXIUM) 20 MG capsule Take 20 mg by mouth daily as needed (acid reflux).     . famotidine (PEPCID) 20 MG tablet Take 1 tablet (20 mg total) by mouth 2 (two) times daily. 10 tablet 0  . LORazepam (ATIVAN) 0.5 MG tablet Take 0.5 mg by mouth 2 (two) times daily as needed for sleep.     . nicotine (NICODERM CQ - DOSED IN MG/24 HOURS) 21 mg/24hr patch Place 1 patch (21 mg total) onto the skin daily. (Patient not taking: Reported on 07/07/2019) 28 patch 3  . nicotine (NICODERM CQ - DOSED  IN MG/24 HOURS) 21 mg/24hr patch Place 21 mg onto the skin daily. Sometimes removes at night    . oxyCODONE (ROXICODONE) 15 MG immediate release tablet TK 1 T PO Q 4 TO 6 H. NTE 5 TS IN 24 H     No current facility-administered medications for this visit.    Allergies as of 12/23/2019 - Review Complete 10/28/2019  Allergen Reaction Noted  . Amoxicillin Anaphylaxis and Hives 07/25/2018  . Aspirin Anaphylaxis, Hives, and Swelling 05/16/2017  . Benadryl [diphenhydramine] Anaphylaxis and Hives 05/16/2017  . Ciprocin-fluocin-procin [fluocinolone] Anaphylaxis and Hives 05/16/2017  . Ciprofloxacin Anaphylaxis 07/25/2018  . Morphine and related Anaphylaxis, Hives, and Swelling 05/16/2017  . Mushroom extract complex Anaphylaxis and Hives 07/25/2018  . Penicillins Anaphylaxis and Hives 05/16/2017  . Sulfa antibiotics Anaphylaxis and Hives 05/16/2017  . Bactrim [sulfamethoxazole-trimethoprim] Hives and Swelling 10/12/2018     Family History  Problem Relation Age of Onset  . Cancer Mother   . Heart attack Father   . Heart attack Paternal Grandmother     Social History   Socioeconomic History  . Marital status: Divorced    Spouse name: Not on file  . Number of children: Not on file  . Years of education: Not on file  . Highest education level: Not on file  Occupational History  . Occupation: retired  Tobacco Use  . Smoking status: Current Some Day Smoker    Packs/day: 0.25    Years: 46.00    Pack years: 11.50    Types: Cigarettes  . Smokeless tobacco: Never Used  . Tobacco comment: she is trying to quit.  She is not smoking everyday  Substance and Sexual Activity  . Alcohol use: Not Currently  . Drug use: No  . Sexual activity: Not on file  Other Topics Concern  . Not on file  Social History Narrative   Lives w daughter and 5 grandchildren    Social Determinants of Health   Financial Resource Strain:   . Difficulty of Paying Living Expenses: Not on file  Food Insecurity:   . Worried About Charity fundraiser in the Last Year: Not on file  . Ran Out of Food in the Last Year: Not on file  Transportation Needs: Unmet Transportation Needs  . Lack of Transportation (Medical): Yes  . Lack of Transportation (Non-Medical): Yes  Physical Activity:   . Days of Exercise per Week: Not on file  . Minutes of Exercise per Session: Not on file  Stress:   . Feeling of Stress : Not on file  Social Connections:   . Frequency of Communication with Friends and Family: Not on file  . Frequency of Social Gatherings with Friends and Family: Not on file  . Attends Religious Services: Not on file  . Active Member of Clubs or Organizations: Not on file  . Attends Archivist Meetings: Not on file  . Marital Status: Not on file  Intimate Partner Violence: Not At Risk  . Fear of Current or Ex-Partner: No  . Emotionally Abused: No  . Physically Abused: No  . Sexually Abused: No    Review of  Systems: 12 system ROS is negative except as noted above.   Physical Exam: General:   Alert,  well-nourished, pleasant and cooperative in NAD Head:  Normocephalic and atraumatic. Eyes:  Sclera clear, no icterus.   Conjunctiva pink. Ears:  Normal auditory acuity. Nose:  No deformity, discharge,  or lesions. Mouth:  No deformity or lesions.  Neck:  Supple; no masses or thyromegaly. Lungs:  Clear throughout to auscultation.   No wheezes. Heart:  Regular rate and rhythm; no murmurs. Abdomen:  Soft, nontender, nondistended, normal bowel sounds, no rebound or guarding. No hepatosplenomegaly.   Rectal:  Deferred  Msk:  Symmetrical. No boney deformities LAD: No inguinal or umbilical LAD Extremities:  No clubbing or edema. Neurologic:  Alert and  oriented x4;  grossly nonfocal Skin:  Intact without significant lesions or rashes. Psych:  Alert and cooperative. Normal mood and affect.    Godric Lavell L. Tarri Glenn, MD, MPH 12/22/2019, 5:46 PM

## 2019-12-23 ENCOUNTER — Encounter: Payer: Self-pay | Admitting: Gastroenterology

## 2019-12-23 ENCOUNTER — Ambulatory Visit: Payer: Medicare Other | Admitting: Gastroenterology

## 2019-12-23 ENCOUNTER — Other Ambulatory Visit: Payer: Self-pay

## 2019-12-23 VITALS — BP 120/70 | HR 113 | Temp 98.4°F | Ht 70.0 in | Wt 196.0 lb

## 2019-12-23 DIAGNOSIS — K219 Gastro-esophageal reflux disease without esophagitis: Secondary | ICD-10-CM | POA: Diagnosis not present

## 2019-12-23 DIAGNOSIS — G8929 Other chronic pain: Secondary | ICD-10-CM | POA: Diagnosis not present

## 2019-12-23 DIAGNOSIS — R933 Abnormal findings on diagnostic imaging of other parts of digestive tract: Secondary | ICD-10-CM | POA: Diagnosis not present

## 2019-12-23 DIAGNOSIS — R1013 Epigastric pain: Secondary | ICD-10-CM

## 2019-12-23 NOTE — Patient Instructions (Addendum)
Dr Tarri Glenn recommends that you complete a bowel purge (to clean out your bowels). Please do the following: Purchase a bottle of Miralax over the counter as well as a box of 5 mg dulcolax tablets. Take 4 dulcolax tablets. Wait 1 hour. You will then drink 6-8 capfuls of Miralax mixed in an adequate amount of water/juice/gatorade (you may choose which of these liquids to drink) over the next 2-3 hours. You should expect results within 1 to 6 hours after completing the bowel purge.    We will call you to schedule your endoscopy at the hospital.

## 2020-01-03 ENCOUNTER — Other Ambulatory Visit: Payer: Self-pay | Admitting: Emergency Medicine

## 2020-01-03 DIAGNOSIS — R933 Abnormal findings on diagnostic imaging of other parts of digestive tract: Secondary | ICD-10-CM

## 2020-01-03 DIAGNOSIS — G8929 Other chronic pain: Secondary | ICD-10-CM

## 2020-01-03 DIAGNOSIS — K219 Gastro-esophageal reflux disease without esophagitis: Secondary | ICD-10-CM

## 2020-02-03 ENCOUNTER — Other Ambulatory Visit (HOSPITAL_COMMUNITY): Payer: Medicare Other

## 2020-02-04 ENCOUNTER — Other Ambulatory Visit (HOSPITAL_COMMUNITY)
Admission: RE | Admit: 2020-02-04 | Discharge: 2020-02-04 | Disposition: A | Discharge status: Home / Self Care | Payer: Medicare Other | Source: Ambulatory Visit | Attending: Gastroenterology | Admitting: Gastroenterology

## 2020-02-04 DIAGNOSIS — Z01812 Encounter for preprocedural laboratory examination: Secondary | ICD-10-CM | POA: Insufficient documentation

## 2020-02-04 DIAGNOSIS — Z20822 Contact with and (suspected) exposure to covid-19: Secondary | ICD-10-CM | POA: Insufficient documentation

## 2020-02-04 LAB — SARS CORONAVIRUS 2 (TAT 6-24 HRS): SARS Coronavirus 2: NEGATIVE

## 2020-02-08 ENCOUNTER — Ambulatory Visit (HOSPITAL_COMMUNITY): Payer: Medicare Other | Admitting: Anesthesiology

## 2020-02-08 ENCOUNTER — Ambulatory Visit (HOSPITAL_COMMUNITY)
Admission: RE | Admit: 2020-02-08 | Discharge: 2020-02-08 | Disposition: A | Discharge status: Home / Self Care | Payer: Medicare Other | Attending: Gastroenterology | Admitting: Gastroenterology

## 2020-02-08 ENCOUNTER — Encounter (HOSPITAL_COMMUNITY)
Admission: RE | Disposition: A | Discharge status: Home / Self Care | Payer: Self-pay | Source: Home / Self Care | Attending: Gastroenterology

## 2020-02-08 ENCOUNTER — Other Ambulatory Visit: Payer: Self-pay

## 2020-02-08 ENCOUNTER — Encounter (HOSPITAL_COMMUNITY): Payer: Self-pay | Admitting: Gastroenterology

## 2020-02-08 ENCOUNTER — Telehealth: Payer: Self-pay

## 2020-02-08 DIAGNOSIS — K2100 Gastro-esophageal reflux disease with esophagitis, without bleeding: Secondary | ICD-10-CM | POA: Insufficient documentation

## 2020-02-08 DIAGNOSIS — K209 Esophagitis, unspecified without bleeding: Secondary | ICD-10-CM

## 2020-02-08 DIAGNOSIS — G8929 Other chronic pain: Secondary | ICD-10-CM

## 2020-02-08 DIAGNOSIS — M1990 Unspecified osteoarthritis, unspecified site: Secondary | ICD-10-CM | POA: Diagnosis not present

## 2020-02-08 DIAGNOSIS — K2970 Gastritis, unspecified, without bleeding: Secondary | ICD-10-CM | POA: Insufficient documentation

## 2020-02-08 DIAGNOSIS — F17210 Nicotine dependence, cigarettes, uncomplicated: Secondary | ICD-10-CM | POA: Insufficient documentation

## 2020-02-08 DIAGNOSIS — R933 Abnormal findings on diagnostic imaging of other parts of digestive tract: Secondary | ICD-10-CM

## 2020-02-08 DIAGNOSIS — K5903 Drug induced constipation: Secondary | ICD-10-CM | POA: Insufficient documentation

## 2020-02-08 DIAGNOSIS — R1013 Epigastric pain: Secondary | ICD-10-CM | POA: Diagnosis present

## 2020-02-08 DIAGNOSIS — C760 Malignant neoplasm of head, face and neck: Secondary | ICD-10-CM | POA: Diagnosis not present

## 2020-02-08 DIAGNOSIS — I700 Atherosclerosis of aorta: Secondary | ICD-10-CM | POA: Diagnosis not present

## 2020-02-08 DIAGNOSIS — Z981 Arthrodesis status: Secondary | ICD-10-CM | POA: Diagnosis not present

## 2020-02-08 DIAGNOSIS — F419 Anxiety disorder, unspecified: Secondary | ICD-10-CM | POA: Diagnosis not present

## 2020-02-08 DIAGNOSIS — J449 Chronic obstructive pulmonary disease, unspecified: Secondary | ICD-10-CM | POA: Insufficient documentation

## 2020-02-08 DIAGNOSIS — K219 Gastro-esophageal reflux disease without esophagitis: Secondary | ICD-10-CM

## 2020-02-08 HISTORY — PX: BIOPSY: SHX5522

## 2020-02-08 HISTORY — PX: ESOPHAGOGASTRODUODENOSCOPY (EGD) WITH PROPOFOL: SHX5813

## 2020-02-08 SURGERY — ESOPHAGOGASTRODUODENOSCOPY (EGD) WITH PROPOFOL
Anesthesia: Monitor Anesthesia Care

## 2020-02-08 MED ORDER — PROPOFOL 10 MG/ML IV BOLUS
INTRAVENOUS | Status: DC | PRN
  Administered 2020-02-08: 20 mg via INTRAVENOUS
  Administered 2020-02-08: 10 mg via INTRAVENOUS

## 2020-02-08 MED ORDER — SODIUM CHLORIDE 0.9 % IV SOLN: INTRAVENOUS | Status: DC

## 2020-02-08 MED ORDER — LACTATED RINGERS IV SOLN
INTRAVENOUS | Status: DC
  Administered 2020-02-08: 1000 mL via INTRAVENOUS

## 2020-02-08 MED ORDER — PROPOFOL 500 MG/50ML IV EMUL
INTRAVENOUS | Status: DC | PRN
  Administered 2020-02-08: 135 ug/kg/min via INTRAVENOUS

## 2020-02-08 SURGICAL SUPPLY — 15 items

## 2020-02-08 NOTE — Anesthesia Preprocedure Evaluation (Signed)
Anesthesia Evaluation    Reviewed: Allergy & Precautions, Patient's Chart, lab work & pertinent test results  History of Anesthesia Complications Negative for: history of anesthetic complications  Airway Mallampati: I  TM Distance: >3 FB Neck ROM: Full    Dental  (+) Edentulous Upper, Edentulous Lower   Pulmonary COPD,  COPD inhaler, Current Smoker and Patient abstained from smoking., former smoker,     + decreased breath sounds      Cardiovascular negative cardio ROS   Rhythm:Regular Rate:Normal     Neuro/Psych  Headaches, Anxiety    GI/Hepatic Neg liver ROS, GERD  ,  Endo/Other  negative endocrine ROS  Renal/GU      Musculoskeletal  (+) Arthritis ,   Abdominal Normal abdominal exam  (+)   Peds  Hematology negative hematology ROS (+)   Anesthesia Other Findings   Reproductive/Obstetrics                             Anesthesia Physical  Anesthesia Plan  ASA: III  Anesthesia Plan: MAC   Post-op Pain Management:    Induction:   PONV Risk Score and Plan: 2 and Ondansetron, Midazolam and Propofol infusion  Airway Management Planned: Natural Airway, Simple Face Mask and Nasal Cannula  Additional Equipment: None  Intra-op Plan:   Post-operative Plan:   Informed Consent:   Plan Discussed with:   Anesthesia Plan Comments:         Anesthesia Quick Evaluation

## 2020-02-08 NOTE — Transfer of Care (Signed)
Immediate Anesthesia Transfer of Care Note  Patient: Stacey Herman  Procedure(s) Performed: ESOPHAGOGASTRODUODENOSCOPY (EGD) WITH PROPOFOL (N/A ) BIOPSY  Patient Location: Endoscopy Unit  Anesthesia Type:MAC  Level of Consciousness: awake, alert , oriented and patient cooperative  Airway & Oxygen Therapy: Patient Spontanous Breathing and Patient connected to face mask oxygen  Post-op Assessment: Report given to RN, Post -op Vital signs reviewed and stable and Patient moving all extremities  Post vital signs: Reviewed and stable  Last Vitals:  Vitals Value Taken Time  BP    Temp    Pulse    Resp    SpO2      Last Pain:  Vitals:   02/08/20 1046  TempSrc:   PainSc: 0-No pain         Complications: No apparent anesthesia complications

## 2020-02-08 NOTE — Anesthesia Postprocedure Evaluation (Signed)
Anesthesia Post Note  Patient: Stacey Herman  Procedure(s) Performed: ESOPHAGOGASTRODUODENOSCOPY (EGD) WITH PROPOFOL (N/A ) BIOPSY     Patient location during evaluation: Endoscopy Anesthesia Type: MAC Level of consciousness: awake and alert Pain management: pain level controlled Vital Signs Assessment: post-procedure vital signs reviewed and stable Respiratory status: spontaneous breathing, nonlabored ventilation, respiratory function stable and patient connected to nasal cannula oxygen Cardiovascular status: blood pressure returned to baseline and stable Postop Assessment: no apparent nausea or vomiting Anesthetic complications: no    Last Vitals:  Vitals:   02/08/20 1232 02/08/20 1245  BP: (!) 113/92 112/70  Pulse: 81 80  Resp: 17 11  Temp:    SpO2: 95% 94%    Last Pain:  Vitals:   02/08/20 1245  TempSrc:   PainSc: 0-No pain                 Fred Franzen DANIEL

## 2020-02-08 NOTE — Discharge Instructions (Signed)
YOU HAD AN ENDOSCOPIC PROCEDURE TODAY: Refer to the procedure report and other information in the discharge instructions given to you for any specific questions about what was found during the examination. If this information does not answer your questions, please call [place] office at [phone] to clarify.  ° °YOU SHOULD EXPECT: Some feelings of bloating in the abdomen. Passage of more gas than usual. Walking can help get rid of the air that was put into your GI tract during the procedure and reduce the bloating. If you had a lower endoscopy (such as a colonoscopy or flexible sigmoidoscopy) you may notice spotting of blood in your stool or on the toilet paper. Some abdominal soreness may be present for a day or two, also. ° °DIET: Your first meal following the procedure should be a light meal and then it is ok to progress to your normal diet. A half-sandwich or bowl of soup is an example of a good first meal. Heavy or fried foods are harder to digest and may make you feel nauseous or bloated. Drink plenty of fluids but you should avoid alcoholic beverages for 24 hours. If you had a esophageal dilation, please see attached instructions for diet.   ° °ACTIVITY: Your care partner should take you home directly after the procedure. You should plan to take it easy, moving slowly for the rest of the day. You can resume normal activity the day after the procedure however YOU SHOULD NOT DRIVE, use power tools, machinery or perform tasks that involve climbing or major physical exertion for 24 hours (because of the sedation medicines used during the test).  ° °SYMPTOMS TO REPORT IMMEDIATELY: °A gastroenterologist can be reached at any hour. Please call [phone]  for any of the following symptoms:  °Following lower endoscopy (colonoscopy, flexible sigmoidoscopy) °Excessive amounts of blood in the stool  °Significant tenderness, worsening of abdominal pains  °Swelling of the abdomen that is new, acute  °Fever of 100° or  higher  °Following upper endoscopy (EGD, EUS, ERCP, esophageal dilation) °Vomiting of blood or coffee ground material  °New, significant abdominal pain  °New, significant chest pain or pain under the shoulder blades  °Painful or persistently difficult swallowing  °New shortness of breath  °Black, tarry-looking or red, bloody stools ° °FOLLOW UP:  °If any biopsies were taken you will be contacted by phone or by letter within the next 1-3 weeks. Call [phone]  if you have not heard about the biopsies in 3 weeks.  °Please also call with any specific questions about appointments or follow up tests. ° °

## 2020-02-08 NOTE — Telephone Encounter (Signed)
-----   Message from [place], MD sent at 02/08/2020 12:28 PM EDT ----- Please arrange for the following: Nexium 40 mg QAM Famotidine 20 mg BID Follow-up with me in 6-8 weeks  At the time of endoscopy she was not aware of these medications....although we discussed them during her last office visit. Thank you.  KLB

## 2020-02-08 NOTE — H&P (Signed)
Referring Provider: No ref. provider found Primary Care Physician:  Rogers Blocker, MD  Reason for Consultation:  Epigastric pain   IMPRESSION:  Epigastric pain - acute onset 09/2019    - normal liver enzymes, lipase    - CT abd/pelvis 10/29/19: stool burden, tortuous transverse colon Significant stool burden on recent CT scan Reflux controlled on PPI therapy for many years Head and neck cancer diagnosed and treatment with surgery 07/2019    - patient declined recommended radiation therapy Percocet-induced constipation    - controlled with prune juice Colonoscopy in [place] 3 years ago Walks with a walker  Acute onset epigastric pain with normal liver enzymes, lipase, and CTAP showing only a significant stool burden with a tortuous transverse colon. Epigastric pain may be related to significant stool burden noted on recent CT scan. Will give a bowel purge first. Hopefully, this will provide symptom relief. If not, will proceed with EGD to evaluate for gastritis, H pylori, esophagitis, and PUD.   Continue GERD regimen. Consider increased the dose of Nexium, however, the patient did not think this would be helpful.   PLAN: EGD at the hospital given recent head and neck cancer and need for a walker   HPI: Stacey Herman is a 64 y.o. female self-referred for abdominal pain. She presents today for endoscopy after outpatient consultation in February.  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 Records from Dr. Isidore Moos show pTX, pN2a, cM0, p16. She had surgery, although the details are unavailable to me. She declined recommended radiation.  She also has arthritis, anxiety, and has been on multiple PPIs over the years for reflux. She walks with a walker.   Generalized, aching, non-radiating abdominal pain that started acutely in December 2020. Associated nausea and gas. No change with eating, defecation ,or movement.  No change in bowel  habits. No blood or mucous in the stool. No melena, hematochezia, or bright red blood per rectum.  Has some constipation related to Percocet which she treats with prune juice that is unchanged since her abdominal pain started.   Evaluated in the ED 10/29/19 when she presented with these symptoms that were so severe that she thought she was having a heart attack. No source of symptoms was identified despite labs and CT scan.  Discharged on Maalax and famotidine 20 mg BID.   Liver enzymes, lipase normal.  CT abd/pelvis with contrast 10/29/2019: No acute chest findings. Bilateral nonobstructing kidney stones. Large colonic stool burden with a tortuous transverse colon.   Symptoms have worsened since that time.  Symptoms not related to eating, defecation, or movement. PeptoBismal will sometimes provide relief. When she lies in the fetal position things feel better.  Stopped eating spicy foods. She has been unable to identify and other exacerbating or relieving features.   She is concerned that she has stomach cancer.   She does not use any NSAIDs. Will use Tylenol for headache. No CAM or herbal therapies.   Prior screening colonoscopy in [place] 3 years ago. She remembers a normal exam with a 10 year follow-up recommended. No prior upper endoscopy.   No known family history of colon cancer or polyps. No family history of uterine/endometrial cancer, pancreatic cancer or gastric/stomach cancer.   Past Medical History:  Diagnosis Date  . Acid reflux   . Anxiety   . Aortic atherosclerosis ([place])   . Arthritis   . Cancer (HCC)    neck  . Claustrophobia  fears anesthesia and unable to have MRI unless open.  cannot tolerate head being confined.  . Complication of anesthesia    "fights against anesthesia"  . COPD (chronic obstructive pulmonary disease) ([place])   . DDD (degenerative disc disease), lumbar   . Headache    occ - No migraiines in a long time (/15/2020)  . History of blood transfusion     hip replacement  . History of bronchitis   . History of iron deficiency anemia   . History of kidney stones   . Lumbar spondylosis   . Wears dentures    upper and lower  . Wears glasses     Past Surgical History:  Procedure Laterality Date  . APPENDECTOMY    . BACK SURGERY     disectomy x2  . CERVICAL FUSION     x3  . COLONOSCOPY    . CYSTOSCOPY WITH STENT PLACEMENT Left 07/26/2018   Procedure: CYSTOSCOPY, LEFT RETROGRADE, WITH LEFT URETERAL STENT PLACEMENT;  Surgeon: Lucas Mallow, MD;  Location: [facility];  Service: Urology;  Laterality: Left;  . CYSTOSCOPY/URETEROSCOPY/HOLMIUM LASER/STENT PLACEMENT Left 08/05/2018   Procedure: CYSTOSCOPY LEFT /URETEROSCOPY/HOLMIUM LASER/STENT EXCHANGE;  Surgeon: Lucas Mallow, MD;  Location: [facility];  Service: Urology;  Laterality: Left;  . DIRECT LARYNGOSCOPY N/A 07/14/2019   Procedure: DIRECT LARYNGOSCOPY;  Surgeon: Leta Baptist, MD;  Location: [facility];  Service: ENT;  Laterality: N/A;  . ESOPHAGOGASTRODUODENOSCOPY    . ESOPHAGOSCOPY N/A 07/14/2019   Procedure: ESOPHAGOSCOPY;  Surgeon: Leta Baptist, MD;  Location: [place];  Service: ENT;  Laterality: N/A;  . FLEXIBLE BRONCHOSCOPY N/A 07/14/2019   Procedure: FLEXIBLE BRONCHOSCOPY;  Surgeon: Leta Baptist, MD;  Location: [place];  Service: ENT;  Laterality: N/A;  . RADICAL NECK DISSECTION Right 07/14/2019   Procedure: RADICAL NECK DISSECTION;  Surgeon: Leta Baptist, MD;  Location: [place];  Service: ENT;  Laterality: Right;  . TONSILLECTOMY Right 07/14/2019   Procedure: Right TONSILLECTOMY;  Surgeon: Leta Baptist, MD;  Location: [place];  Service: ENT;  Laterality: Right;  . TOTAL HIP ARTHROPLASTY Left     Current Facility-Administered Medications  Medication Dose Route Frequency Provider Last Rate Last Admin  . 0.9 %  sodium chloride infusion   Intravenous Continuous Thornton Park, MD      . lactated ringers infusion   Intravenous Continuous Thornton Park, MD        Allergies as of  01/03/2020 - Review Complete 12/23/2019  Allergen Reaction Noted  . Amoxicillin Anaphylaxis and Hives 07/25/2018  . Aspirin Anaphylaxis, Hives, and Swelling 05/16/2017  . Benadryl [diphenhydramine] Anaphylaxis and Hives 05/16/2017  . Ciprocin-fluocin-procin [fluocinolone] Anaphylaxis and Hives 05/16/2017  . Ciprofloxacin Anaphylaxis 07/25/2018  . Morphine and related Anaphylaxis, Hives, and Swelling 05/16/2017  . Mushroom extract complex Anaphylaxis and Hives 07/25/2018  . Penicillins Anaphylaxis and Hives 05/16/2017  . Sulfa antibiotics Anaphylaxis and Hives 05/16/2017  . Bactrim [sulfamethoxazole-trimethoprim] Hives and Swelling 10/12/2018    Family History  Problem Relation Age of Onset  . Breast cancer Mother        spead to her brain and body  . Heart attack Father   . Heart attack Paternal Grandmother   . Colon cancer Neg Hx   . Rectal cancer Neg Hx   . Esophageal cancer Neg Hx     Social History   Socioeconomic History  . Marital status: Divorced    Spouse name: Not on file  . Number of children: 3  . Years  of education: Not on file  . Highest education level: Not on file  Occupational History  . Occupation: disabled  Tobacco Use  . Smoking status: Current Some Day Smoker    Packs/day: 0.25    Years: 46.00    Pack years: 11.50    Types: Cigarettes  . Smokeless tobacco: Never Used  . Tobacco comment: she is trying to quit.  She is not smoking everyday  Substance and Sexual Activity  . Alcohol use: Not Currently  . Drug use: No  . Sexual activity: Not on file  Other Topics Concern  . Not on file  Social History Narrative   Lives w daughter and 5 grandchildren    Social Determinants of Health   Financial Resource Strain:   . Difficulty of Paying Living Expenses:   Food Insecurity:   . Worried About Charity fundraiser in the Last Year:   . Arboriculturist in the Last Year:   Transportation Needs: Unmet Transportation Needs  . Lack of Transportation  (Medical): Yes  . Lack of Transportation (Non-Medical): Yes  Physical Activity:   . Days of Exercise per Week:   . Minutes of Exercise per Session:   Stress:   . Feeling of Stress :   Social Connections:   . Frequency of Communication with Friends and Family:   . Frequency of Social Gatherings with Friends and Family:   . Attends Religious Services:   . Active Member of Clubs or Organizations:   . Attends Archivist Meetings:   Marland Kitchen Marital Status:   Intimate Partner Violence: Not At Risk  . Fear of Current or Ex-Partner: No  . Emotionally Abused: No  . Physically Abused: No  . Sexually Abused: No    Review of Systems: 12 system ROS is negative except as noted above.   Physical Exam: General:   Alert,  well-nourished, pleasant and cooperative in NAD Head:  Normocephalic and atraumatic. Eyes:  Sclera clear, no icterus.   Conjunctiva pink. Ears:  Normal auditory acuity. Nose:  No deformity, discharge,  or lesions. Mouth:  No deformity or lesions.   Neck:  Supple; no masses or thyromegaly. Lungs:  Clear throughout to auscultation.   No wheezes. Heart:  Regular rate and rhythm; no murmurs. Abdomen:  Soft, nontender, nondistended, normal bowel sounds, no rebound or guarding. No hepatosplenomegaly.   Rectal:  Deferred  Msk:  Symmetrical. No boney deformities LAD: No inguinal or umbilical LAD Extremities:  No clubbing or edema. Neurologic:  Alert and  oriented x4;  grossly nonfocal Skin:  Intact without significant lesions or rashes. Psych:  Alert and cooperative. Normal mood and affect.    Marquavis Hannen L. Tarri Glenn, MD, MPH 02/08/2020, 10:31 AM

## 2020-02-08 NOTE — Op Note (Addendum)
[facility] Patient Name: Stacey Herman Procedure Date: 02/08/2020 MRN: [id number] Attending MD: Thornton Park MD, MD Date of Birth: 10/23/1956 CSN: [id number] Age: 64 Admit Type: Outpatient Procedure:                Upper GI endoscopy Indications:              Epigastric pain - acute onset 09/2019                           - normal liver enzymes, lipase                           - CT abd/pelvis 10/29/19: stool burden, tortuous                            transverse colon, no source for pain identified                           Significant stool burden on recent CT scan                           Reflux controlled on PPI therapy for many years                           Head and neck cancer diagnosed and treatment with                            surgery 07/2019                           - patient declined recommended radiation therapy Providers:                Thornton Park MD, MD, Carollee Sires, Technician Referring MD:              Medicines:                Monitored Anesthesia Care Complications:            No immediate complications. Estimated blood loss:                            Minimal. Estimated Blood Loss:     Estimated blood loss was minimal. Procedure:                Pre-Anesthesia Assessment:                           - Prior to the procedure, a History and Physical                            was performed, and patient medications and                            allergies were reviewed. The patient's tolerance of  previous anesthesia was also reviewed. The risks                            and benefits of the procedure and the sedation                            options and risks were discussed with the patient.                            All questions were answered, and informed consent                            was obtained. Prior Anticoagulants: The patient has           taken no previous anticoagulant or antiplatelet                            agents. ASA Grade Assessment: III - A patient with                            severe systemic disease. After reviewing the risks                            and benefits, the patient was deemed in                            satisfactory condition to undergo the procedure.                           After obtaining informed consent, the endoscope was                            passed under direct vision. Throughout the                            procedure, the patient's blood pressure, pulse, and                            oxygen saturations were monitored continuously. The                            [id number] [id number]) was introduced through the                            mouth, and advanced to the third part of duodenum.                            The upper GI endoscopy was accomplished without                            difficulty. The patient tolerated the procedure                            well. Scope In: Scope Out: Findings:  LA Grade A (one or more mucosal breaks less than 5 mm, not extending       between tops of 2 mucosal folds) esophagitis with no bleeding was found.       The mid-esophagus had a slightly ringed appearance. Biopsies were taken       from the proximal/mid and distal esophagus with a cold forceps for       histology. Estimated blood loss was minimal.      Diffuse moderate inflammation characterized by erythema, friability and       granularity was found in the gastric body. Biopsies were taken from the       antrum, body, and fundus with a cold forceps for histology. Estimated       blood loss was minimal.      The examined duodenum was normal.      The cardia and gastric fundus were normal on retroflexion.      The exam was otherwise without abnormality. Impression:               - LA Grade A esophagitis with no bleeding. Biopsied.                           - Gastritis.  Biopsied.                           - Normal examined duodenum.                           - The examination was otherwise normal. Moderate Sedation:      Not Applicable - Patient had care per Anesthesia. Recommendation:           - Patient has a contact number available for                            emergencies. The signs and symptoms of potential                            delayed complications were discussed with the                            patient. Return to normal activities tomorrow.                            Written discharge instructions were provided to the                            patient.                           - Resume previous diet.                           - Continue present medications. I recommended                            Nexium 40 mg daily and famotidine 20 mg twice  daily. New prescriptions will be sent to the                            pharmacy.                           - No aspirin, ibuprofen, naproxen, or other                            non-steroidal anti-inflammatory drugs.                           - Await pathology results.                           - Return to my office to review these results and                            ongoing symptoms in 6-8 weeks, earlier if needed. Procedure Code(s):        --- Professional ---                           [phone], Esophagogastroduodenoscopy, flexible,                            transoral; with biopsy, single or multiple Diagnosis Code(s):        --- Professional ---                           K20.90, Esophagitis, unspecified without bleeding                           K29.70, Gastritis, unspecified, without bleeding                           R10.13, Epigastric pain CPT copyright 2019 [employer]. All rights reserved. The codes documented in this report are preliminary and upon coder review may  be revised to meet current compliance requirements. Thornton Park MD,  MD 02/08/2020 12:25:20 PM This report has been signed electronically. Number of Addenda: 0

## 2020-02-09 ENCOUNTER — Other Ambulatory Visit: Payer: Self-pay

## 2020-02-09 ENCOUNTER — Encounter: Payer: Self-pay | Admitting: *Deleted

## 2020-02-09 LAB — SURGICAL PATHOLOGY

## 2020-02-09 MED ORDER — FAMOTIDINE 20 MG PO TABS: 20.0000 mg | ORAL_TABLET | Freq: Two times a day (BID) | ORAL | 3 refills | Status: AC

## 2020-02-09 MED ORDER — ESOMEPRAZOLE MAGNESIUM 40 MG PO CPDR: 40.0000 mg | DELAYED_RELEASE_CAPSULE | Freq: Every morning | ORAL | 3 refills | Status: AC

## 2020-02-09 NOTE — Telephone Encounter (Signed)
Scripts sent to pharmacy. Pt scheduled to see Dr. Tarri Glenn 03/22/20@9 :50am. Pt aware.

## 2020-02-15 ENCOUNTER — Encounter: Payer: Self-pay | Admitting: Gastroenterology

## 2020-03-22 ENCOUNTER — Ambulatory Visit: Payer: Medicare Other | Admitting: Gastroenterology

## 2020-08-15 ENCOUNTER — Institutional Professional Consult (permissible substitution): Payer: Medicare Other | Admitting: Pulmonary Disease

## 2020-09-05 ENCOUNTER — Institutional Professional Consult (permissible substitution): Payer: Medicare Other | Admitting: Pulmonary Disease

## 2020-09-15 IMAGING — DX DG CHEST 2V
2 series · 2 of 2 positions shown · non-contrast
Comparison: August 23, 2017

CLINICAL DATA: Chest pain.

EXAM:
CHEST - 2 VIEW

[chest pa]
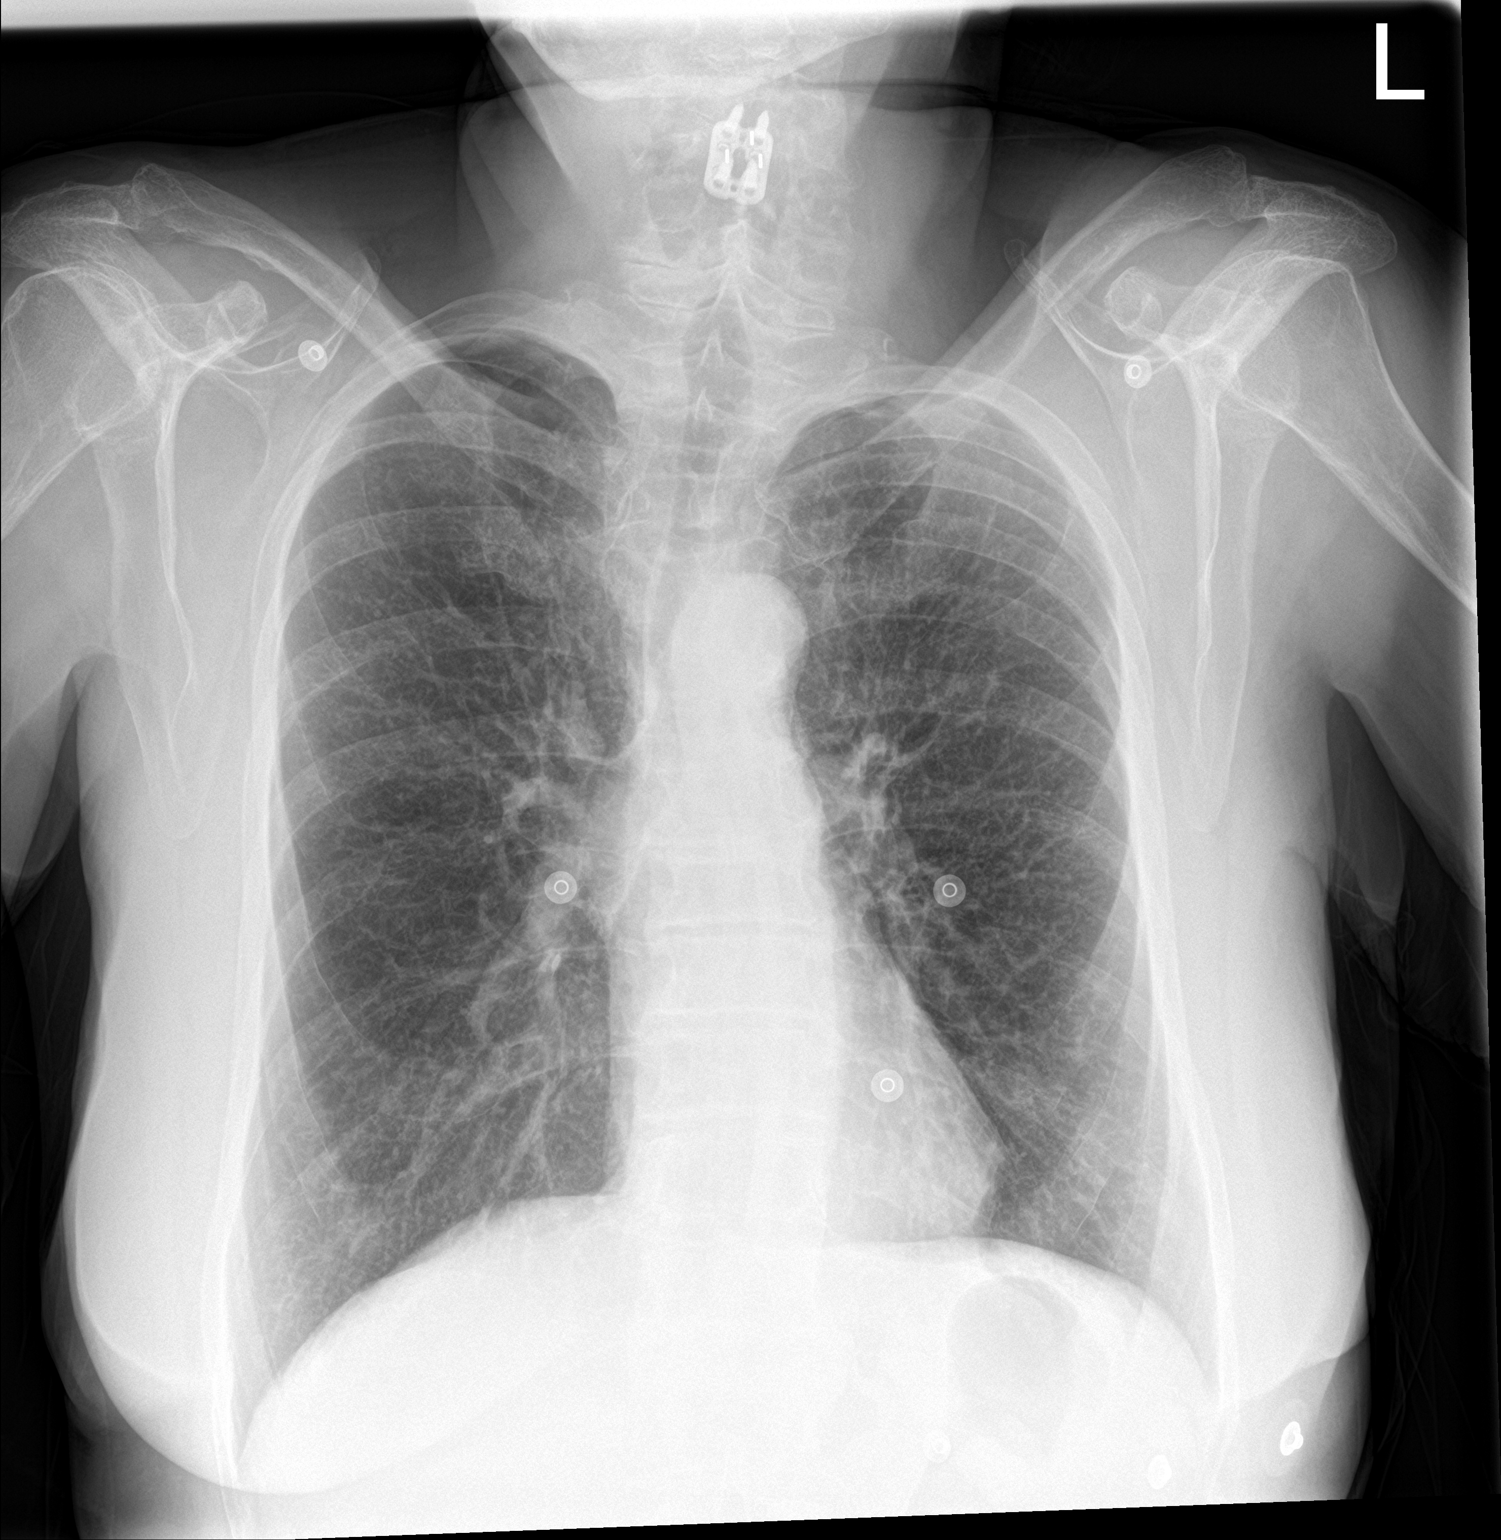

[chest lat]
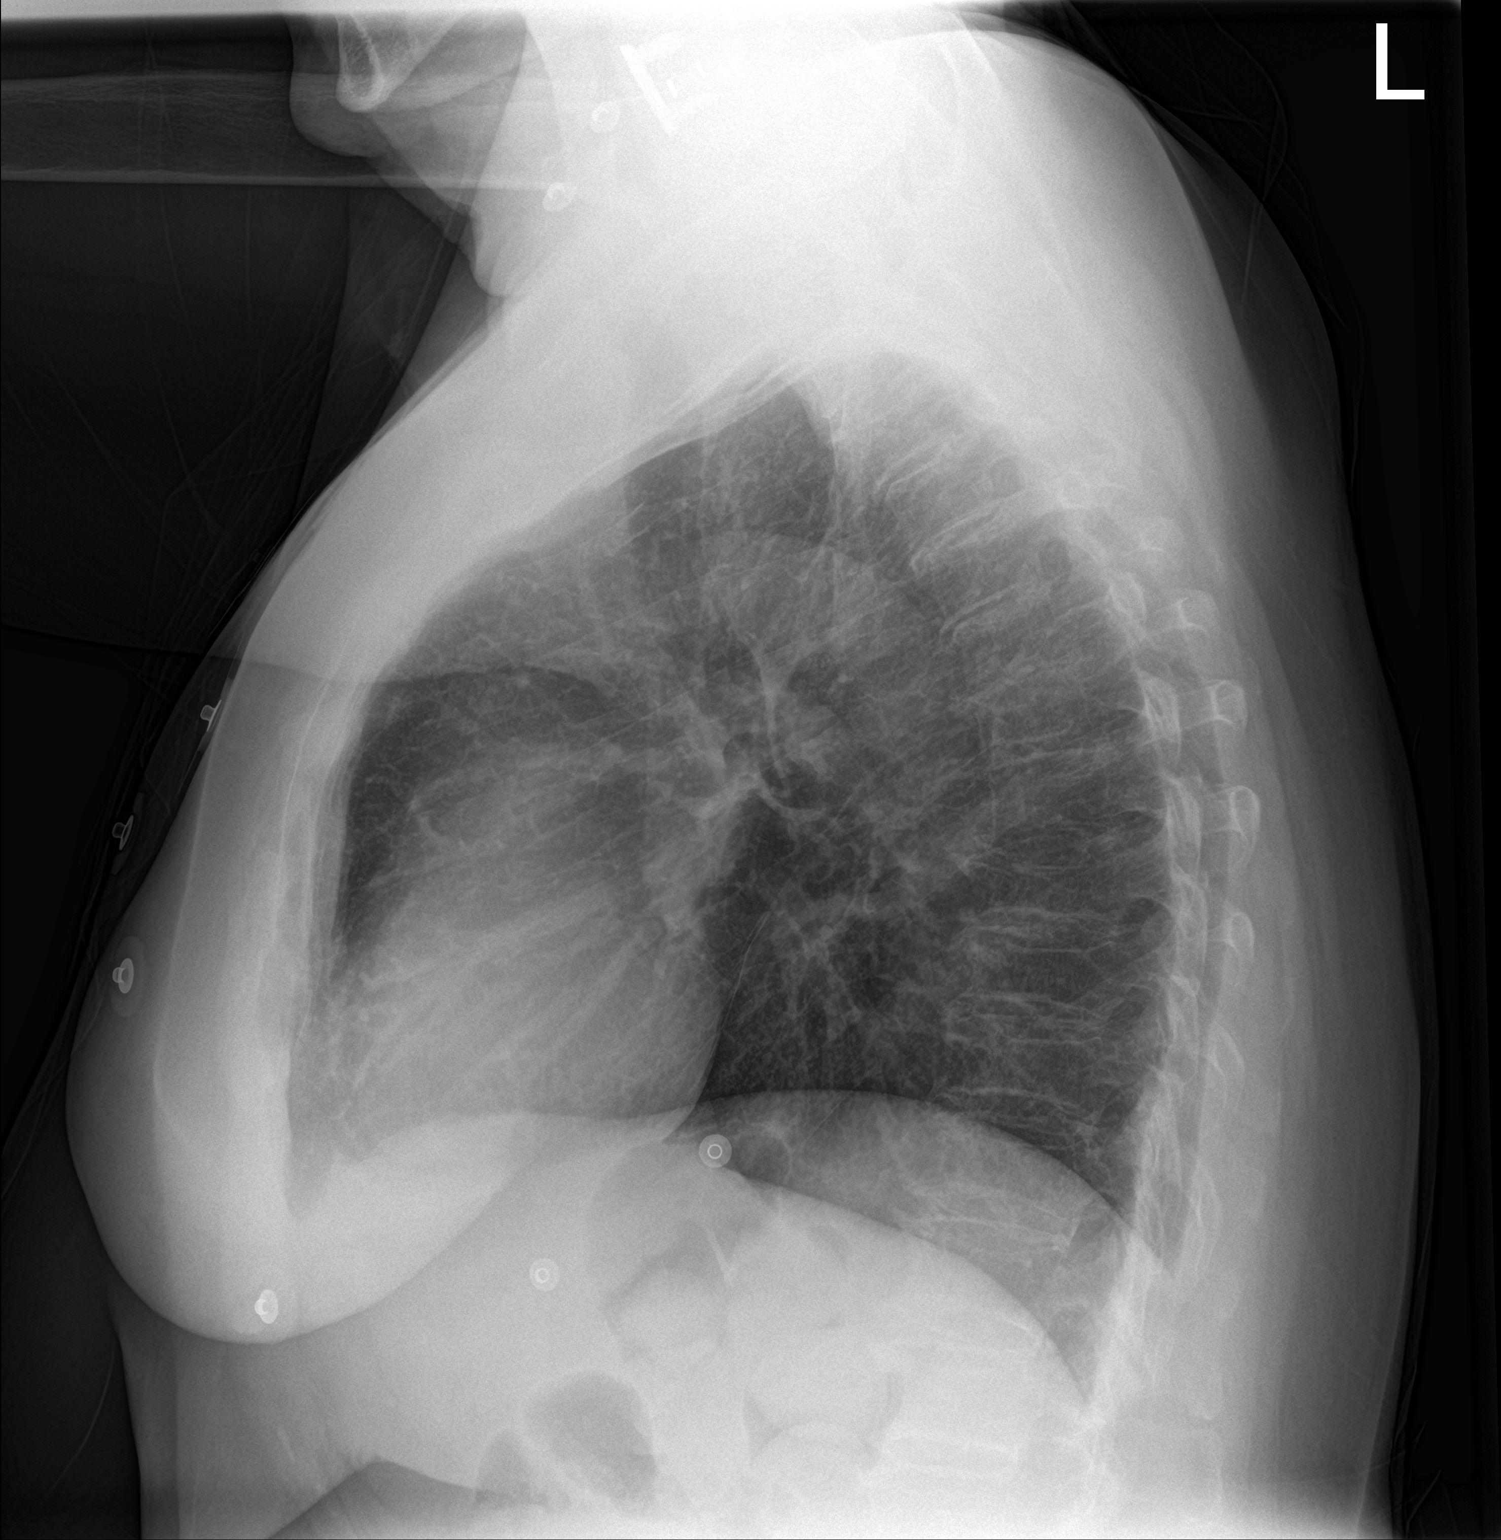

[2 of 2 positions shown; findings below may reference images not displayed]

FINDINGS: The heart size and mediastinal contours are within normal limits.
Both lungs are clear. The visualized skeletal structures are
unremarkable.
IMPRESSION: No active cardiopulmonary disease.

## 2021-05-11 IMAGING — US ULTRASOUND OF THE LYMPH NODES
1 series · 13 of 16 positions shown · non-contrast
Comparison: none

INDICATION: 63-year-old with an enlarged right lymph node. Evaluate for a
neoplastic process.

[Series 1: ultrasound of the lymph nodes · 13 of 16 slices shown]
[im 1/16]
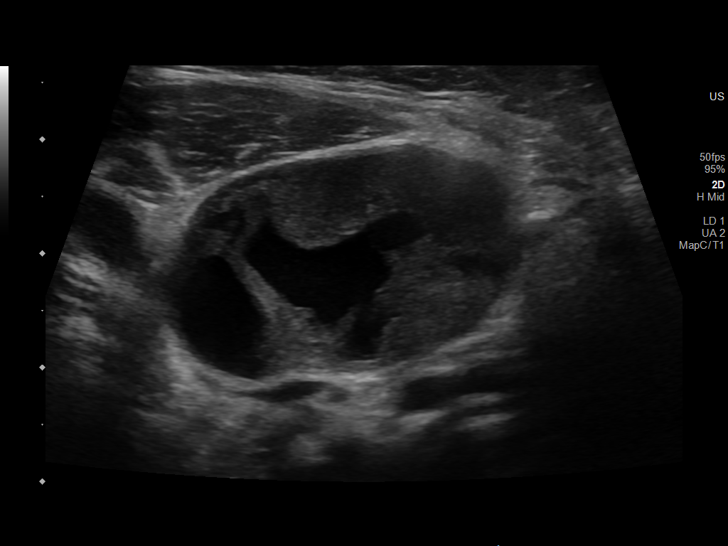
[im 2/16]
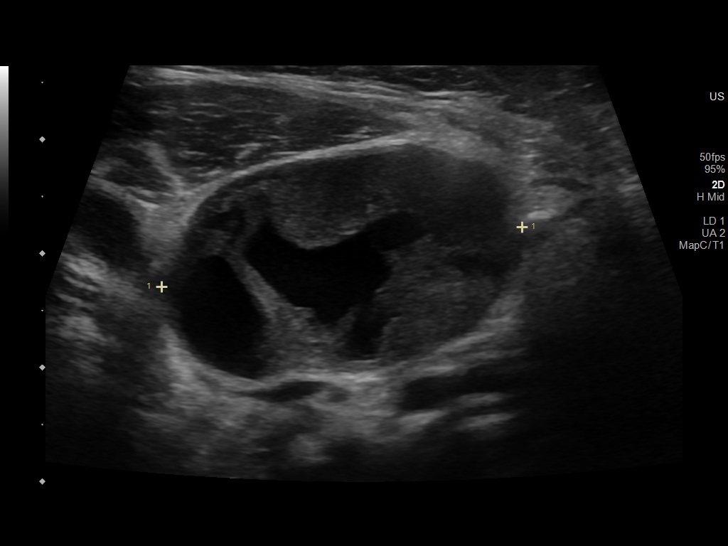
[im 4/16]
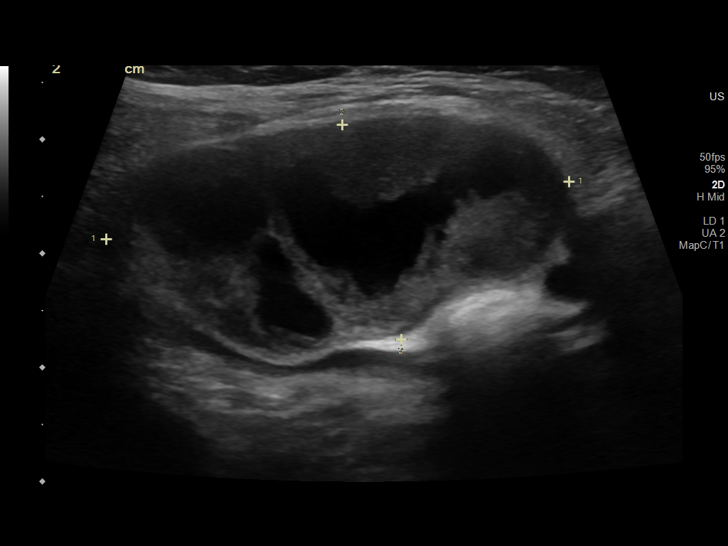
[im 5/16]
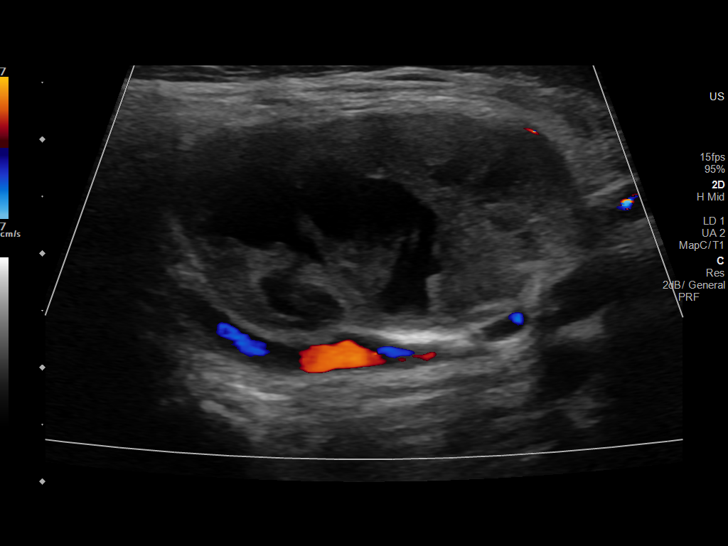
[im 6/16]
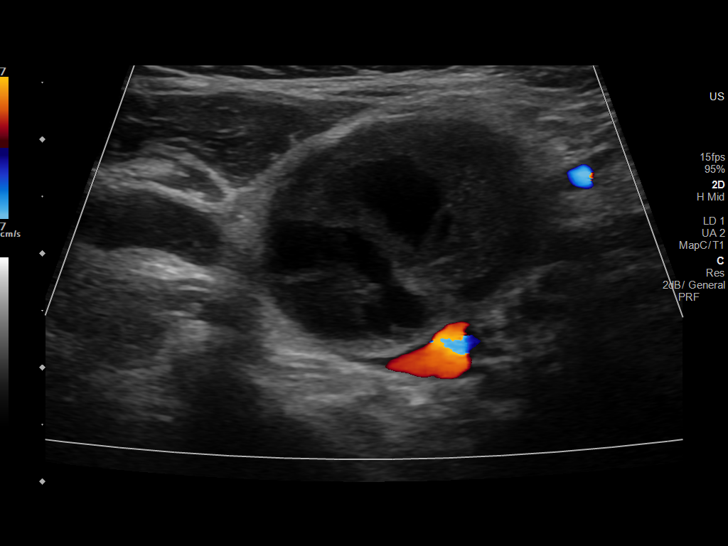
[im 7/16]
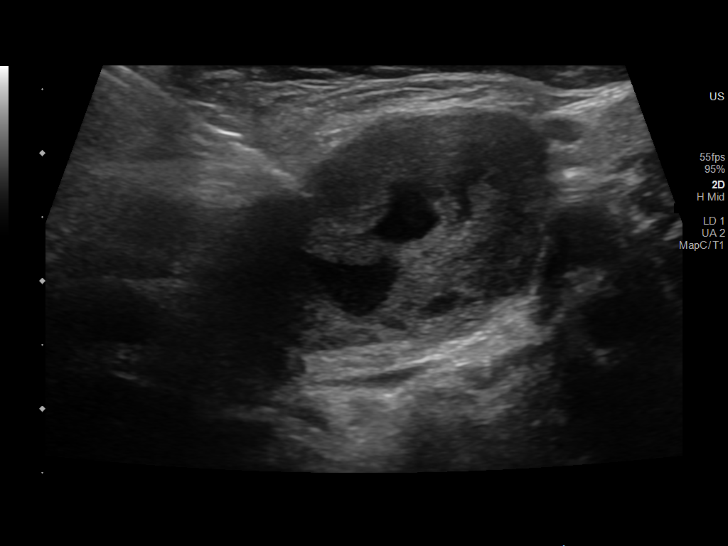
[im 9/16]
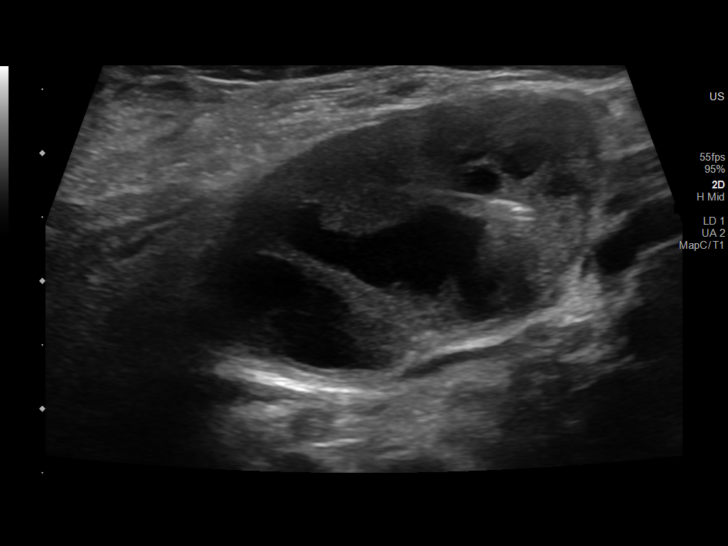
[im 10/16]
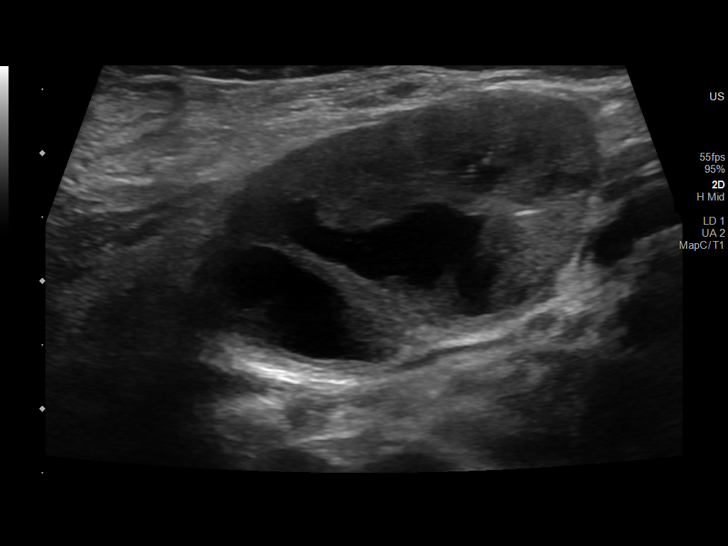
[im 11/16]
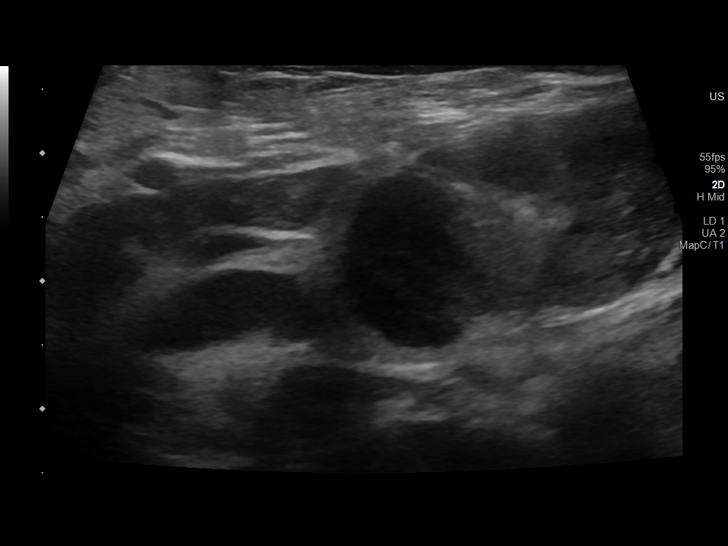
[im 12/16]
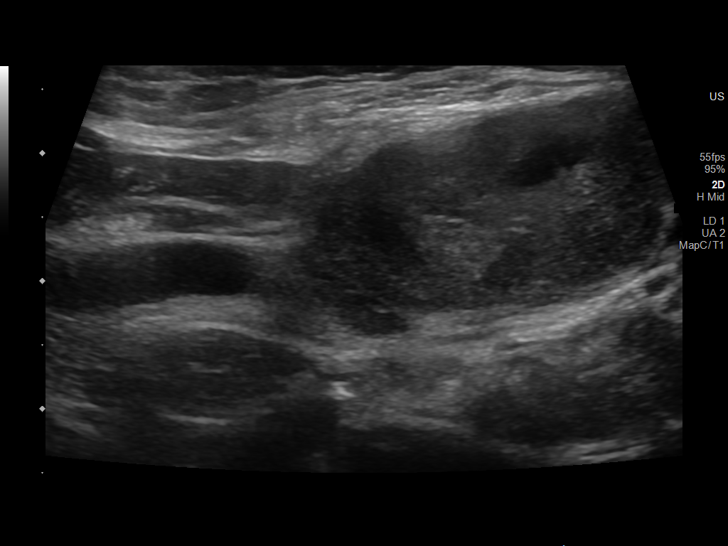
[im 13/16]
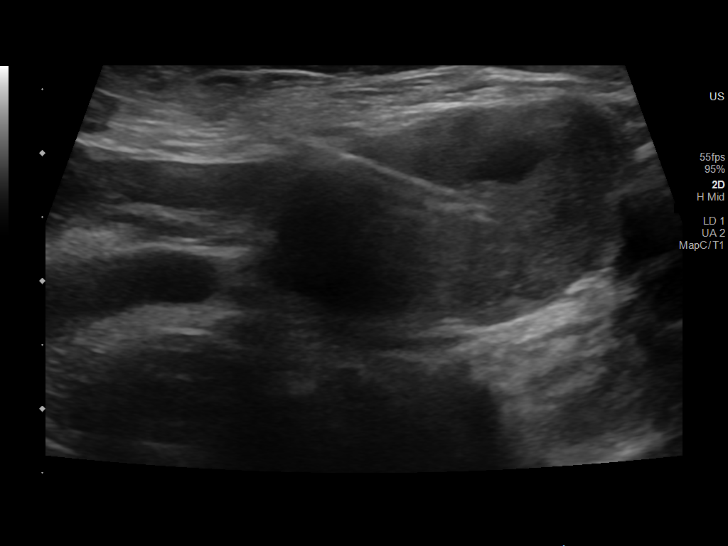
[im 15/16]
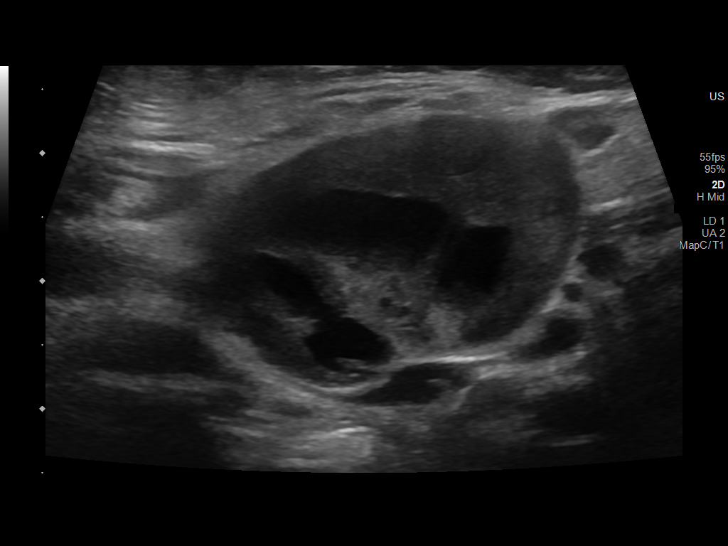
[im 16/16]
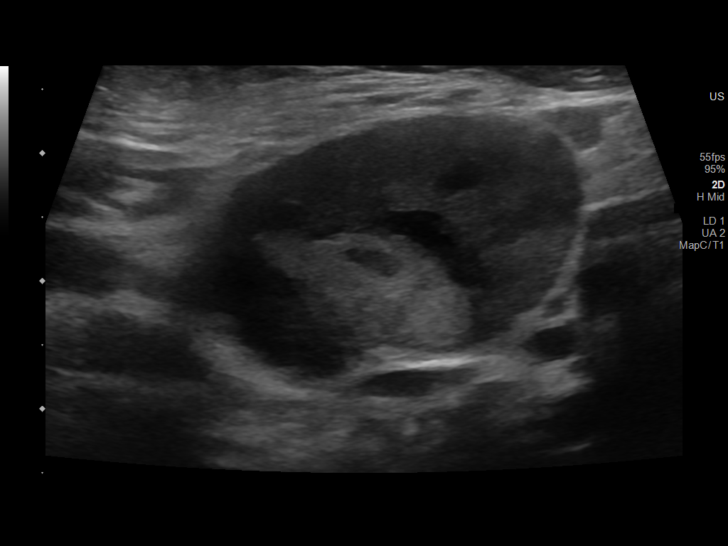

[13 of 16 positions shown; findings below may reference images not displayed]

EXAM:
ULTRASOUND-GUIDED BIOPSY OF RIGHT NECK LYMPH NODE

MEDICATIONS:
None.

ANESTHESIA/SEDATION:
Moderate (conscious) sedation was employed during this procedure. A
total of Versed 2.0 mg and Fentanyl 100 mcg was administered
intravenously.

Moderate Sedation Time: 15 minutes. The patient's level of
consciousness and vital signs were monitored continuously by
radiology nursing throughout the procedure under my direct
supervision.

FLUOROSCOPY TIME:  None

COMPLICATIONS:
None immediate.

PROCEDURE:
Informed written consent was obtained from the patient after a
thorough discussion of the procedural risks, benefits and
alternatives. All questions were addressed. A timeout was performed
prior to the initiation of the procedure.

The neck was evaluated with ultrasound. Enlarged lymph node in the
upper right neck was identified and targeted for biopsy. The neck
was prepped with chlorhexidine and sterile field was created. Skin
and soft tissues were anesthetized with 1% lidocaine. Using
ultrasound guidance, 18 gauge core needle was directed into the
enlarged lymph node. Multiple core samples were obtained and placed
in saline. Bandage placed over the puncture site.
FINDINGS: Enlarged partially necrotic right cervical lymph node that measures
4.1 x 2.0 x 3.2 cm. Biopsy needle confirmed within the lymph node.
IMPRESSION: Ultrasound-guided core biopsies of right cervical lymph node.
